# Patient Record
Sex: Female | Born: 1950
Health system: Southern US, Community
[De-identification: ages and names within clinical notes are randomized; demographics above are authoritative.]

## PROBLEM LIST (undated history)

## (undated) DIAGNOSIS — T7840XA Allergy, unspecified, initial encounter: Secondary | ICD-10-CM

## (undated) DIAGNOSIS — E785 Hyperlipidemia, unspecified: Secondary | ICD-10-CM

## (undated) DIAGNOSIS — C3490 Malignant neoplasm of unspecified part of unspecified bronchus or lung: Secondary | ICD-10-CM

## (undated) DIAGNOSIS — I10 Essential (primary) hypertension: Secondary | ICD-10-CM

## (undated) DIAGNOSIS — K635 Polyp of colon: Secondary | ICD-10-CM

## (undated) HISTORY — DX: Essential (primary) hypertension: I10

## (undated) HISTORY — DX: Allergy, unspecified, initial encounter: T78.40XA

## (undated) HISTORY — DX: Polyp of colon: K63.5

## (undated) HISTORY — PX: COLONOSCOPY: SHX174

## (undated) HISTORY — DX: Hyperlipidemia, unspecified: E78.5

---

## 1982-10-08 HISTORY — PX: ABDOMINAL HYSTERECTOMY: SHX81

## 1988-10-08 HISTORY — PX: BREAST BIOPSY: SHX20

## 2004-09-01 ENCOUNTER — Emergency Department: Payer: Self-pay | Admitting: Unknown Physician Specialty

## 2006-03-19 ENCOUNTER — Ambulatory Visit: Payer: Self-pay

## 2006-10-08 HISTORY — PX: CHOLECYSTECTOMY: SHX55

## 2007-03-25 ENCOUNTER — Ambulatory Visit: Payer: Self-pay | Admitting: Family Medicine

## 2008-06-01 ENCOUNTER — Ambulatory Visit: Payer: Self-pay

## 2008-06-16 ENCOUNTER — Ambulatory Visit: Payer: Self-pay

## 2010-02-16 ENCOUNTER — Ambulatory Visit: Payer: Self-pay | Admitting: Internal Medicine

## 2012-04-24 ENCOUNTER — Ambulatory Visit: Payer: Self-pay | Admitting: Internal Medicine

## 2013-01-22 ENCOUNTER — Ambulatory Visit: Payer: Self-pay

## 2013-01-26 ENCOUNTER — Ambulatory Visit: Payer: Self-pay

## 2013-01-26 LAB — CBC WITH DIFFERENTIAL/PLATELET
Eosinophil %: 2 %
HGB: 13.6 g/dL (ref 12.0–16.0)
Lymphocyte #: 2.2 10*3/uL (ref 1.0–3.6)
MCHC: 32.2 g/dL (ref 32.0–36.0)
MCV: 83 fL (ref 80–100)
Monocyte #: 0.6 x10 3/mm (ref 0.2–0.9)
Monocyte %: 9.1 %
Neutrophil #: 3.2 10*3/uL (ref 1.4–6.5)
Neutrophil %: 52 %
Platelet: 289 10*3/uL (ref 150–440)
WBC: 6.2 10*3/uL (ref 3.6–11.0)

## 2013-01-26 LAB — BASIC METABOLIC PANEL
Anion Gap: 9 (ref 7–16)
Calcium, Total: 9.5 mg/dL (ref 8.5–10.1)
Chloride: 98 mmol/L (ref 98–107)
Co2: 32 mmol/L (ref 21–32)
EGFR (Non-African Amer.): >60

## 2013-01-26 LAB — MAGNESIUM: Magnesium: 2.1 mg/dL

## 2013-04-29 ENCOUNTER — Ambulatory Visit: Payer: Self-pay | Admitting: Internal Medicine

## 2013-05-07 ENCOUNTER — Ambulatory Visit: Payer: Self-pay | Admitting: Internal Medicine

## 2013-05-08 HISTORY — PX: BREAST BIOPSY: SHX20

## 2013-05-25 ENCOUNTER — Ambulatory Visit (INDEPENDENT_AMBULATORY_CARE_PROVIDER_SITE_OTHER): Payer: BC Managed Care – PPO | Admitting: General Surgery

## 2013-05-25 ENCOUNTER — Other Ambulatory Visit: Payer: Self-pay

## 2013-05-25 ENCOUNTER — Encounter: Payer: Self-pay | Admitting: General Surgery

## 2013-05-25 VITALS — BP 132/84 | HR 80 | Resp 14 | Ht 64.0 in | Wt 144.0 lb

## 2013-05-25 DIAGNOSIS — N63 Unspecified lump in unspecified breast: Secondary | ICD-10-CM

## 2013-05-25 NOTE — Patient Instructions (Addendum)

## 2013-05-25 NOTE — Progress Notes (Signed)
Patient ID: Sandra Jordan, female   DOB: 1951/08/21, 62 y.o.   MRN: [id number]  Chief Complaint  Patient presents with  . Other    mammogram    HPI Sandra Jordan is a 62 y.o. female who presents for a breast evaluation. The most recent mammogram and ultrasound was done on 04/29/13 cat 4 Patient does perform regular self breast checks and gets regular mammograms done. Patient has had bilateral breast biopsy in the past.  No current symptoms.  HPI  Past Medical History  Diagnosis Date  . Hypertension   . Hyperlipidemia   . Allergy     Past Surgical History  Procedure Laterality Date  . Abdominal hysterectomy  1984  . Cholecystectomy  2008  . Breast biopsy Bilateral 1990    History reviewed. No pertinent family history.  Social History History  Substance Use Topics  . Smoking status: Never Smoker   . Smokeless tobacco: Never Used  . Alcohol Use: No    No Known Allergies  Current Outpatient Prescriptions  Medication Sig Dispense Refill  . cetirizine (ZYRTEC) 10 MG tablet Take 10 mg by mouth daily.      . hydrochlorothiazide (HYDRODIURIL) 25 MG tablet Take 25 mg by mouth daily.      Marland Kitchen lovastatin (MEVACOR) 20 MG tablet Take 20 mg by mouth at bedtime.       No current facility-administered medications for this visit.    Review of Systems Review of Systems  Constitutional: Negative.   Respiratory: Negative.   Cardiovascular: Negative.     Blood pressure 132/84, pulse 80, resp. rate 14, height 5\' 4"  (1.626 m), weight 144 lb (65.318 kg).  Physical Exam Physical Exam  Constitutional: She is oriented to person, place, and time. She appears well-developed and well-nourished.  Eyes: Conjunctivae are normal. No scleral icterus.  Neck: Neck supple.  Cardiovascular: Normal rate and normal heart sounds.   Pulmonary/Chest: Breath sounds normal. Right breast exhibits no inverted nipple, no mass, no nipple discharge, no skin change and no tenderness. Left breast exhibits no  inverted nipple, no mass, no nipple discharge, no skin change and no tenderness.  Abdominal: Bowel sounds are normal. There is no hepatosplenomegaly. There is no tenderness. No hernia.  Lymphadenopathy:    She has no cervical adenopathy.    She has no axillary adenopathy.  Neurological: She is alert and oriented to person, place, and time.  Skin: Skin is warm and dry.    Data Reviewed Mammogram and ultrasound reviewed.   Assessment    2 small solid nodules in right breast near areola at 1 and 5 o'cl    Plan    Discussed core biopsy anpt was agreeable -completed today.       SANKAR,SEEPLAPUTHUR G 05/25/2013, 9:43 AM

## 2013-05-26 LAB — PATHOLOGY

## 2013-05-27 ENCOUNTER — Telehealth: Payer: Self-pay | Admitting: *Deleted

## 2013-05-27 NOTE — Telephone Encounter (Signed)
Message copied by Currie Paris on Wed May 27, 2013  8:05 AM ------      Message from: Kieth Brightly      Created: Wed May 27, 2013  5:58 AM       Please let pt pt know the pathology was normal. F/u in 3 mos ------

## 2013-05-27 NOTE — Telephone Encounter (Signed)
Notified patient as instructed, patient very pleased. Discussed follow-up appointments, patient agrees 

## 2013-08-27 ENCOUNTER — Other Ambulatory Visit: Payer: BC Managed Care – PPO

## 2013-08-27 ENCOUNTER — Encounter: Payer: Self-pay | Admitting: General Surgery

## 2013-08-27 ENCOUNTER — Ambulatory Visit (INDEPENDENT_AMBULATORY_CARE_PROVIDER_SITE_OTHER): Payer: BC Managed Care – PPO | Admitting: General Surgery

## 2013-08-27 VITALS — BP 120/78 | HR 74 | Resp 12 | Ht 64.0 in | Wt 145.0 lb

## 2013-08-27 DIAGNOSIS — N63 Unspecified lump in unspecified breast: Secondary | ICD-10-CM

## 2013-08-27 NOTE — Patient Instructions (Addendum)
Continue self breast exams. Call office for any new breast issues or concerns. Return in 2 months with right mammogram and office visit

## 2013-08-27 NOTE — Progress Notes (Addendum)
Patient ID: Sandra Jordan, female   DOB: [birth year], 62 y.o.   MRN: [id number]  Chief Complaint  Patient presents with  . Follow-up    breast ultrasound    HPI Sandra Jordan is a 62 y.o. female.  who presents for her follow up right breast evaluation and ultrasound.  Patient does perform regular self breast checks and gets regular mammograms done.  No new breast complaints. 3 months ago she had a core biopsy of 2 nodules in the right breast, both were benign on pathology.  HPI  Past Medical History  Diagnosis Date  . Hypertension   . Hyperlipidemia   . Allergy     Past Surgical History  Procedure Laterality Date  . Abdominal hysterectomy  1984  . Cholecystectomy  2008  . Breast biopsy Bilateral 1990  . Breast biopsy Right August 2014    History reviewed. No pertinent family history.  Social History History  Substance Use Topics  . Smoking status: Never Smoker   . Smokeless tobacco: Never Used  . Alcohol Use: No    No Known Allergies  Current Outpatient Prescriptions  Medication Sig Dispense Refill  . cetirizine (ZYRTEC) 10 MG tablet Take 10 mg by mouth daily.      . hydrochlorothiazide (HYDRODIURIL) 25 MG tablet Take 25 mg by mouth daily.      Marland Kitchen lovastatin (MEVACOR) 20 MG tablet Take 20 mg by mouth at bedtime.       No current facility-administered medications for this visit.    Review of Systems Review of Systems  Constitutional: Negative.   Respiratory: Negative.   Cardiovascular: Negative.     Blood pressure 120/78, pulse 74, resp. rate 12, height 5\' 4"  (1.626 m), weight 145 lb (65.772 kg).  Physical Exam Physical Exam  Constitutional: She is oriented to person, place, and time. She appears well-developed and well-nourished.  Neck: Neck supple.  Pulmonary/Chest: Right breast exhibits no inverted nipple, no mass, no nipple discharge, no skin change and no tenderness. Left breast exhibits no inverted nipple, no mass, no nipple discharge, no skin  change and no tenderness.  Lymphadenopathy:    She has no cervical adenopathy.    She has no axillary adenopathy.  Neurological: She is alert and oriented to person, place, and time.  Skin: Skin is warm and dry.    Data Reviewed [place] right breast done today- mass at 1 o'cl is slightly larger with a thick margin-likely [post biopsy changes. Mass at 5 ocl remains unchanged to smaller.   Assessment    Benign nodules  Right breast    Plan    Return in 2 months with right mammogram and office visit       Sandra Jordan 08/28/2013, 5:53 PM

## 2013-08-28 ENCOUNTER — Encounter: Payer: Self-pay | Admitting: General Surgery

## 2013-08-28 ENCOUNTER — Telehealth: Payer: Self-pay | Admitting: *Deleted

## 2013-08-28 ENCOUNTER — Encounter: Payer: Self-pay | Admitting: *Deleted

## 2013-08-28 NOTE — Telephone Encounter (Signed)
The patient has been asked to return to the office in two months for a unilateral right breast diagnostic mammogram. This has been arranged at [facility] for 10-22-13 at 4 pm. She is presently scheduled to follow up in the office on 11-03-13 at 4:45 pm. Message has been left on home phone number as patient had requested. Letter has also been mailed to patient with this information.

## 2013-10-22 ENCOUNTER — Ambulatory Visit: Payer: Self-pay | Admitting: General Surgery

## 2013-10-23 ENCOUNTER — Encounter: Payer: Self-pay | Admitting: General Surgery

## 2013-10-26 ENCOUNTER — Encounter: Payer: Self-pay | Admitting: General Surgery

## 2013-11-03 ENCOUNTER — Encounter: Payer: Self-pay | Admitting: General Surgery

## 2013-11-03 ENCOUNTER — Ambulatory Visit (INDEPENDENT_AMBULATORY_CARE_PROVIDER_SITE_OTHER): Payer: BC Managed Care – PPO | Admitting: General Surgery

## 2013-11-03 VITALS — BP 140/78 | HR 78 | Resp 12 | Ht 64.0 in | Wt 142.0 lb

## 2013-11-03 DIAGNOSIS — N63 Unspecified lump in unspecified breast: Secondary | ICD-10-CM

## 2013-11-03 NOTE — Patient Instructions (Signed)
Patient to return in 6 months with bilateral screening mammogram. Patient to continue self breast checks monthly. Patient to call our office with any questions or concerns.

## 2013-11-03 NOTE — Progress Notes (Signed)
Patient ID: Sandra Jordan, female   DOB: December 14, 1950, 63 y.o.   MRN: [id number]  Chief Complaint  Patient presents with  . Follow-up    mammogram    HPI Sandra Jordan is a 63 y.o. female who presents for a breast evaluation. The most recent mammogram was done on 10/26/13. Patient does perform regular self breast checks and gets regular mammograms done.  The patient denies any problems with her breasts at this time. 5 months ago patient had right breast core biopsy of 2 masses at 1 and 5 o'clock with benign pathology.   HPI  Past Medical History  Diagnosis Date  . Hypertension   . Hyperlipidemia   . Allergy     Past Surgical History  Procedure Laterality Date  . Abdominal hysterectomy  1984  . Cholecystectomy  2008  . Breast biopsy Bilateral 1990  . Breast biopsy Right August 2014    History reviewed. No pertinent family history.  Social History History  Substance Use Topics  . Smoking status: Never Smoker   . Smokeless tobacco: Never Used  . Alcohol Use: No    No Known Allergies  Current Outpatient Prescriptions  Medication Sig Dispense Refill  . cetirizine (ZYRTEC) 10 MG tablet Take 10 mg by mouth daily.      . hydrochlorothiazide (HYDRODIURIL) 25 MG tablet Take 25 mg by mouth daily.      Marland Kitchen lovastatin (MEVACOR) 20 MG tablet Take 20 mg by mouth at bedtime.       No current facility-administered medications for this visit.    Review of Systems Review of Systems  Constitutional: Negative.   Respiratory: Negative.   Cardiovascular: Negative.     Blood pressure 140/78, pulse 78, resp. rate 12, height 5\' 4"  (1.626 m), weight 142 lb (64.411 kg).  Physical Exam Physical Exam  Constitutional: She appears well-developed and well-nourished.  Neck: Neck supple. No thyromegaly present.  Cardiovascular: Regular rhythm.   Pulmonary/Chest: Right breast exhibits no inverted nipple, no mass, no nipple discharge, no skin change and no tenderness. Left breast exhibits no  inverted nipple, no mass, no nipple discharge, no skin change and no tenderness.  Lymphadenopathy:    She has no cervical adenopathy.    She has no axillary adenopathy.    Data Reviewed Mammogram and [place] of right breast   reviewed. Mass at 1 ocl is stable. Mas at 5 ocl is not seen.  Assessment    Breast masses right -benign     Plan    6 mo f/u with bil screening mammogram.        Theopolis Sloop G 11/04/2013, 5:55 AM

## 2014-01-18 ENCOUNTER — Ambulatory Visit: Payer: Self-pay | Admitting: Podiatry

## 2014-01-18 LAB — CBC WITH DIFFERENTIAL/PLATELET
BASOS PCT: 0.9 %
Basophil #: 0.1 10*3/uL (ref 0.0–0.1)
Eosinophil #: 0.2 10*3/uL (ref 0.0–0.7)
Eosinophil %: 2.6 %
HCT: 38.6 % (ref 35.0–47.0)
HGB: 12.4 g/dL (ref 12.0–16.0)
LYMPHS ABS: 2.2 10*3/uL (ref 1.0–3.6)
LYMPHS PCT: 37.9 %
MCH: 27.1 pg (ref 26.0–34.0)
MCHC: 32.1 g/dL (ref 32.0–36.0)
MCV: 84 fL (ref 80–100)
MONOS PCT: 10 %
Monocyte #: 0.6 x10 3/mm (ref 0.2–0.9)
Neutrophil #: 2.9 10*3/uL (ref 1.4–6.5)
Neutrophil %: 48.6 %
Platelet: 287 10*3/uL (ref 150–440)
RBC: 4.58 10*6/uL (ref 3.80–5.20)
RDW: 13.6 % (ref 11.5–14.5)
WBC: 5.9 10*3/uL (ref 3.6–11.0)

## 2014-01-22 ENCOUNTER — Ambulatory Visit: Payer: Self-pay | Admitting: Podiatry

## 2014-01-27 LAB — PATHOLOGY REPORT

## 2014-04-22 ENCOUNTER — Encounter: Payer: Self-pay | Admitting: General Surgery

## 2014-05-11 ENCOUNTER — Encounter: Payer: Self-pay | Admitting: General Surgery

## 2014-05-11 ENCOUNTER — Ambulatory Visit (INDEPENDENT_AMBULATORY_CARE_PROVIDER_SITE_OTHER): Payer: BC Managed Care – PPO | Admitting: General Surgery

## 2014-05-11 VITALS — BP 140/72 | HR 76 | Resp 12 | Ht 64.0 in | Wt 146.0 lb

## 2014-05-11 DIAGNOSIS — N6012 Diffuse cystic mastopathy of left breast: Principal | ICD-10-CM

## 2014-05-11 DIAGNOSIS — N6019 Diffuse cystic mastopathy of unspecified breast: Secondary | ICD-10-CM

## 2014-05-11 DIAGNOSIS — N6011 Diffuse cystic mastopathy of right breast: Secondary | ICD-10-CM | POA: Insufficient documentation

## 2014-05-11 NOTE — Progress Notes (Signed)
Patient ID: Sandra Jordan, female   DOB: 10/27/1950, 63 y.o.   MRN: [id number]  Chief Complaint  Patient presents with  . Follow-up    mammogram    HPI Sandra Jordan is a 63 y.o. female.  who presents for a breast evaluation. The most recent mammogram was done on 04-21-14.  Patient does perform regular self breast checks and gets regular mammograms done.  No new breast issues.  HPI  Past Medical History  Diagnosis Date  . Hypertension   . Hyperlipidemia   . Allergy     Past Surgical History  Procedure Laterality Date  . Abdominal hysterectomy  1984  . Cholecystectomy  2008  . Breast biopsy Bilateral 1990    fibrocystic breast  . Breast biopsy Right August 2014    fibrocystic breast    Family History  Problem Relation Age of Onset  . Cancer Mother     lung  . Cancer Sister     lung    Social History History  Substance Use Topics  . Smoking status: Never Smoker   . Smokeless tobacco: Never Used  . Alcohol Use: No    No Known Allergies  Current Outpatient Prescriptions  Medication Sig Dispense Refill  . acetaminophen (TYLENOL) 500 MG tablet Take by mouth every 4 (four) hours as needed.       . cetirizine (ZYRTEC) 10 MG tablet Take 10 mg by mouth daily.      . hydrochlorothiazide (HYDRODIURIL) 25 MG tablet Take 25 mg by mouth daily.      Marland Kitchen lovastatin (MEVACOR) 20 MG tablet Take 20 mg by mouth at bedtime.       No current facility-administered medications for this visit.    Review of Systems Review of Systems  Constitutional: Negative.   Respiratory: Negative.   Cardiovascular: Negative.     Blood pressure 140/72, pulse 76, resp. rate 12, height 5\' 4"  (1.626 m), weight 146 lb (66.225 kg).  Physical Exam Physical Exam  Constitutional: She is oriented to person, place, and time. She appears well-developed and well-nourished.  Eyes: No scleral icterus.  Neck: Neck supple.  Cardiovascular: Normal rate, regular rhythm and normal heart sounds.    Pulmonary/Chest: Effort normal and breath sounds normal. Right breast exhibits no inverted nipple, no mass, no nipple discharge, no skin change and no tenderness. Left breast exhibits no inverted nipple, no mass, no nipple discharge, no skin change and no tenderness.  Lymphadenopathy:    She has no cervical adenopathy.    She has no axillary adenopathy.  Neurological: She is alert and oriented to person, place, and time.  Skin: Skin is warm and dry.    Data Reviewed Mammogram reviewed and stable.  Assessment    Stable physical exam. Fibrocystic disease by core biopsies done last year and in [place]    Follow up in one year with bilateral screening mammogram and office visit.       SANKAR,SEEPLAPUTHUR G 05/11/2014, 4:51 PM

## 2014-05-11 NOTE — Patient Instructions (Addendum)
Continue self breast exams. Call office for any new breast issues or concerns. Follow up in one year with bilateral screening mammogram and office visit 

## 2014-08-09 ENCOUNTER — Encounter: Payer: Self-pay | Admitting: General Surgery

## 2015-01-29 NOTE — Op Note (Signed)
PATIENT NAME:  Sandra Jordan, Sandra Jordan MR#:  [id number] DATE OF BIRTH:  Apr 23, 1951  DATE OF PROCEDURE:  01/22/2014  SURGEON: Durward Fortes, DPM.  PREOPERATIVE DIAGNOSIS: Exostosis, right midfoot.   POSTOPERATIVE DIAGNOSIS:  Exostosis, right midfoot.  PROCEDURE PERFORMED:  Excision exostosis, right midfoot.  ANESTHESIA: Local MAC.   HEMOSTASIS: Pneumatic tourniquet, right ankle, 250 mmHg.   ESTIMATED BLOOD LOSS: Minimal.   PATHOLOGY: Bone, right midfoot.   COMPLICATIONS: None apparent.   OPERATIVE INDICATIONS: This is a 64 year old female with a chronic painful bone spur on her right midfoot. The patient elects for surgical removal.   OPERATIVE PROCEDURE: The patient was taken to the operating room and placed on the table in the supine position. Following satisfactory sedation, the right foot was anesthetized with 10 mL of 0.5% Sensorcaine plain around the palpable lesion. A pneumatic tourniquet was applied at the level of the right calf, and the foot was prepped and draped in the usual sterile fashion. The foot was exsanguinated and the tourniquet inflated to 250 mmHg.   Attention was then directed to the dorsal aspect of the right foot where an approximate 4 cm linear incision was made over the bony prominence at the first metatarsal cuneiform joint. The incision was deepened via sharp and blunt dissection down to the level of the spur, which was freed from the surrounding soft tissues. Using a curved osteotome, the bony prominence was resected and the edges were rongeured. The bone was then rasped using a power rasp. Intraoperative FluoroScan views revealed good reduction of the deformity. The wound was flushed with copious amounts of sterile saline and closed using 4-0 Vicryl running suture for all layers from capsular tissue to deep and superficial subcutaneous and skin closure. Tincture of benzoin and Steri-Strips were then applied, followed by Xeroform and a sterile bandage. The patient  tolerated the procedure and anesthesia well and was transported to the PACU with vital signs stable and in good condition.     ____________________________ Sharlotte Alamo, DPM tc:dmm D: 01/22/2014 12:02:58 ET T: 01/22/2014 12:24:08 ET JOB#: [id number]  cc: Sharlotte Alamo, DPM, <Dictator> Delmos Velaquez DPM ELECTRONICALLY SIGNED 01/26/2014 11:36

## 2015-04-06 ENCOUNTER — Other Ambulatory Visit: Payer: Self-pay

## 2015-04-06 DIAGNOSIS — Z1231 Encounter for screening mammogram for malignant neoplasm of breast: Secondary | ICD-10-CM

## 2015-05-25 ENCOUNTER — Ambulatory Visit: Payer: Self-pay | Admitting: General Surgery

## 2015-06-02 ENCOUNTER — Encounter: Payer: Self-pay | Admitting: General Surgery

## 2015-06-02 ENCOUNTER — Ambulatory Visit (INDEPENDENT_AMBULATORY_CARE_PROVIDER_SITE_OTHER): Payer: BLUE CROSS/BLUE SHIELD | Admitting: General Surgery

## 2015-06-02 DIAGNOSIS — N6011 Diffuse cystic mastopathy of right breast: Secondary | ICD-10-CM

## 2015-06-02 DIAGNOSIS — N6012 Diffuse cystic mastopathy of left breast: Secondary | ICD-10-CM | POA: Diagnosis not present

## 2015-06-02 NOTE — Patient Instructions (Signed)
Continue self breast exams. Call office for any new breast issues or concerns. 

## 2015-06-02 NOTE — Progress Notes (Signed)
Patient ID: Sandra Jordan, female   DOB: Aug 31, 1951, 65 y.o.   MRN: [id number]  Chief Complaint  Patient presents with  . Follow-up    mammogram    HPI Sandra Jordan is a 64 y.o. female.  who presents for a breast evaluation. The most recent mammogram was done on 05-19-15.  Patient does perform regular self breast checks and gets regular mammograms done. No new breast issues.  HPI  Past Medical History  Diagnosis Date  . Hypertension   . Hyperlipidemia   . Allergy     Past Surgical History  Procedure Laterality Date  . Abdominal hysterectomy  1984  . Cholecystectomy  2008  . Breast biopsy Bilateral 1990    fibrocystic breast  . Breast biopsy Right August 2014    fibrocystic breast  . Colonoscopy  2012    [facility]    Family History  Problem Relation Age of Onset  . Cancer Mother     lung  . Cancer Sister     lung    Social History Social History  Substance Use Topics  . Smoking status: Former Smoker -- 15 years    Quit date: 10/08/2004  . Smokeless tobacco: Never Used  . Alcohol Use: No    No Known Allergies  Current Outpatient Prescriptions  Medication Sig Dispense Refill  . acetaminophen (TYLENOL) 500 MG tablet Take by mouth every 4 (four) hours as needed.     . cetirizine (ZYRTEC) 10 MG tablet Take 10 mg by mouth daily.    . hydrochlorothiazide (HYDRODIURIL) 25 MG tablet Take 25 mg by mouth daily.    Marland Kitchen lovastatin (MEVACOR) 20 MG tablet Take 20 mg by mouth at bedtime.     No current facility-administered medications for this visit.    Review of Systems Review of Systems  Constitutional: Negative.   Respiratory: Negative.   Cardiovascular: Negative.     Blood pressure 110/66, pulse 82, resp. rate 12, height '5\' 4"'$  (1.626 m), weight 142 lb (64.411 kg).  Physical Exam Physical Exam  Constitutional: She is oriented to person, place, and time. She appears well-developed and well-nourished.  HENT:  Mouth/Throat: Oropharynx is clear and  moist.  Eyes: Conjunctivae are normal. No scleral icterus.  Neck: Neck supple.  Cardiovascular: Normal rate, regular rhythm and normal heart sounds.   Pulmonary/Chest: Effort normal and breath sounds normal. Right breast exhibits no inverted nipple, no mass, no nipple discharge, no skin change and no tenderness. Left breast exhibits no inverted nipple, no mass, no nipple discharge, no skin change and no tenderness.  Abdominal: Soft. There is no tenderness.  Lymphadenopathy:    She has no cervical adenopathy.    She has no axillary adenopathy.  Neurological: She is alert and oriented to person, place, and time.  Skin: Skin is warm and dry.  Psychiatric: Her behavior is normal.    Data Reviewed Mammogram reviewed and stable.  Assessment    Stable physical exam. Fibrocystic disease by core biopsies done 2014 and in [place]    Follow up in one year with bilateral screening mammogram and office visit.         PCP:  Kingsley Spittle P  Trenell Concannon G 06/02/2015, 11:30 AM

## 2016-05-17 DIAGNOSIS — K635 Polyp of colon: Secondary | ICD-10-CM | POA: Insufficient documentation

## 2016-05-17 DIAGNOSIS — J309 Allergic rhinitis, unspecified: Secondary | ICD-10-CM | POA: Insufficient documentation

## 2016-05-22 ENCOUNTER — Encounter: Payer: Self-pay | Admitting: General Surgery

## 2016-05-30 ENCOUNTER — Encounter: Payer: Self-pay | Admitting: *Deleted

## 2016-06-05 ENCOUNTER — Encounter: Payer: Self-pay | Admitting: General Surgery

## 2016-06-05 ENCOUNTER — Ambulatory Visit (INDEPENDENT_AMBULATORY_CARE_PROVIDER_SITE_OTHER): Payer: BLUE CROSS/BLUE SHIELD | Admitting: General Surgery

## 2016-06-05 VITALS — BP 122/78 | HR 68 | Resp 12 | Ht 64.0 in | Wt 145.0 lb

## 2016-06-05 DIAGNOSIS — N6012 Diffuse cystic mastopathy of left breast: Secondary | ICD-10-CM | POA: Diagnosis not present

## 2016-06-05 DIAGNOSIS — N6011 Diffuse cystic mastopathy of right breast: Secondary | ICD-10-CM

## 2016-06-05 NOTE — Patient Instructions (Signed)
The patient is aware to call back for any questions or concerns. Follow up in one year with bilateral screening mammogram and office visit

## 2016-06-05 NOTE — Progress Notes (Signed)
Patient ID: Sandra Jordan, female   DOB: [birth year], 65 y.o.   MRN: [id number]  Chief Complaint  Patient presents with  . Follow-up    mammogram    HPI Sandra Jordan is a 65 y.o. female.  who presents for a breast evaluation. The most recent mammogram was done on 05-21-16.  Patient does perform regular self breast checks and gets regular mammograms done.   No new breast issues. I have reviewed the history of present illness with the patient.  HPI  Past Medical History:  Diagnosis Date  . Allergy   . Colon polyp   . Hyperlipidemia   . Hypertension     Past Surgical History:  Procedure Laterality Date  . ABDOMINAL HYSTERECTOMY  1984  . BREAST BIOPSY Bilateral 1990   fibrocystic breast  . BREAST BIOPSY Right August 2014   fibrocystic breast  . CHOLECYSTECTOMY  2008  . COLONOSCOPY  2012, 2017   [facility]    Family History  Problem Relation Age of Onset  . Cancer Mother     lung  . Cancer Sister     lung  . Colon cancer Father 20    Social History Social History  Substance Use Topics  . Smoking status: Former Smoker    Years: 15.00    Quit date: 10/08/2004  . Smokeless tobacco: Never Used  . Alcohol use No    No Known Allergies  Current Outpatient Prescriptions  Medication Sig Dispense Refill  . acetaminophen (TYLENOL) 500 MG tablet Take by mouth every 4 (four) hours as needed.     . cetirizine (ZYRTEC) 10 MG tablet Take 10 mg by mouth daily.    . Cyanocobalamin (VITAMIN B 12 PO) Take by mouth daily.    . hydrochlorothiazide (HYDRODIURIL) 25 MG tablet Take 25 mg by mouth daily.    Marland Kitchen lovastatin (MEVACOR) 20 MG tablet Take 20 mg by mouth at bedtime.     No current facility-administered medications for this visit.     Review of Systems Review of Systems  Constitutional: Negative.   Respiratory: Negative.   Cardiovascular: Negative.     Blood pressure 122/78, pulse 68, resp. rate 12, height '5\' 4"'$  (1.626 m), weight 145 lb (65.8 kg).  Physical  Exam Physical Exam  Constitutional: She is oriented to person, place, and time. She appears well-developed and well-nourished.  HENT:  Mouth/Throat: Oropharynx is clear and moist.  Eyes: Conjunctivae are normal. No scleral icterus.  Neck: Neck supple.  Cardiovascular: Normal rate, regular rhythm and normal heart sounds.   Pulmonary/Chest: Effort normal and breath sounds normal. Right breast exhibits no inverted nipple, no mass, no nipple discharge, no skin change and no tenderness. Left breast exhibits no inverted nipple, no mass, no nipple discharge, no skin change and no tenderness.  Abdominal: Soft. There is no tenderness.  Lymphadenopathy:    She has no cervical adenopathy.    She has no axillary adenopathy.  Neurological: She is alert and oriented to person, place, and time.  Skin: Skin is warm and dry.  Psychiatric: Her behavior is normal.    Data Reviewed Mammogram reviewed and stable  Assessment    Stable physical exam. Fibrocystic disease by core biopsies done 2014 and in [place]    Follow up in one year with bilateral screening mammogram and office visit     This information has been scribed by Karie Fetch RN, BSN,BC.   Maahi Lannan G 06/05/2016, 5:34 PM

## 2017-02-18 ENCOUNTER — Other Ambulatory Visit: Payer: Self-pay | Admitting: Physician Assistant

## 2017-02-18 DIAGNOSIS — N200 Calculus of kidney: Secondary | ICD-10-CM

## 2017-02-19 ENCOUNTER — Ambulatory Visit
Admission: RE | Admit: 2017-02-19 | Discharge: 2017-02-19 | Disposition: A | Discharge status: Home / Self Care | Payer: BLUE CROSS/BLUE SHIELD | Source: Ambulatory Visit | Attending: Physician Assistant | Admitting: Physician Assistant

## 2017-02-19 ENCOUNTER — Other Ambulatory Visit: Payer: Self-pay

## 2017-02-19 DIAGNOSIS — N200 Calculus of kidney: Secondary | ICD-10-CM | POA: Diagnosis not present

## 2017-02-19 DIAGNOSIS — K5790 Diverticulosis of intestine, part unspecified, without perforation or abscess without bleeding: Secondary | ICD-10-CM | POA: Diagnosis not present

## 2017-03-08 ENCOUNTER — Ambulatory Visit (INDEPENDENT_AMBULATORY_CARE_PROVIDER_SITE_OTHER): Payer: BLUE CROSS/BLUE SHIELD | Admitting: Urology

## 2017-03-08 ENCOUNTER — Encounter: Payer: Self-pay | Admitting: Urology

## 2017-03-08 VITALS — BP 133/88 | HR 99 | Ht 63.0 in | Wt 143.0 lb

## 2017-03-08 DIAGNOSIS — M545 Low back pain, unspecified: Secondary | ICD-10-CM

## 2017-03-08 DIAGNOSIS — N200 Calculus of kidney: Secondary | ICD-10-CM | POA: Diagnosis not present

## 2017-03-08 DIAGNOSIS — I10 Essential (primary) hypertension: Secondary | ICD-10-CM | POA: Insufficient documentation

## 2017-03-08 DIAGNOSIS — E785 Hyperlipidemia, unspecified: Secondary | ICD-10-CM | POA: Insufficient documentation

## 2017-03-08 LAB — URINALYSIS, COMPLETE
Bilirubin, UA: NEGATIVE
Glucose, UA: NEGATIVE
Ketones, UA: NEGATIVE
Nitrite, UA: NEGATIVE
Protein, UA: NEGATIVE
RBC, UA: NEGATIVE
Specific Gravity, UA: 1.025 (ref 1.005–1.030)
Urobilinogen, Ur: 0.2 mg/dL (ref 0.2–1.0)
pH, UA: 6 (ref 5.0–7.5)

## 2017-03-08 LAB — MICROSCOPIC EXAMINATION
Epithelial Cells (non renal): >10 /HPF — ABNORMAL HIGH
RBC, UA: NONE SEEN /HPF

## 2017-03-08 NOTE — Progress Notes (Signed)
03/08/2017 2:38 PM   Sandra Jordan 1950/11/24 [id number]  Referring provider: Marinda Elk, MD [facility], [place] [address]  Chief Complaint  Patient presents with  . Nephrolithiasis    New Patient    HPI: 66 year old female who presents today for further evaluation of an incidental left 6 mm lower pole stone.  She reports that a few weeks ago, she developed diffuse low back pain which started more in the left side that radiated across her whole back. She has had difficulty getting out of bed in the morning his pain is been quite severe at times. It improves throughout the day.  Overall, her pain has been improving with avoidance of heavy lifting.  She denies any voiding symptoms including urinary frequency, urgency, incontinence, dysuria, or gross hematuria.  She has no previous stone history.   PMH: Past Medical History:  Diagnosis Date  . Allergy   . Colon polyp   . Hyperlipidemia   . Hypertension     Surgical History: Past Surgical History:  Procedure Laterality Date  . ABDOMINAL HYSTERECTOMY  1984  . BREAST BIOPSY Bilateral 1990   fibrocystic breast  . BREAST BIOPSY Right August 2014   fibrocystic breast  . CHOLECYSTECTOMY  2008  . COLONOSCOPY  2012, 2017   [facility] Medications:  Allergies as of 03/08/2017   No Known Allergies     Medication List       Accurate as of 03/08/17  2:38 PM. Always use your most recent med list.          cetirizine 10 MG tablet Commonly known as:  ZYRTEC Take 10 mg by mouth daily.   hydrochlorothiazide 25 MG tablet Commonly known as:  HYDRODIURIL Take 25 mg by mouth daily.   lovastatin 20 MG tablet Commonly known as:  MEVACOR Take 20 mg by mouth at bedtime.       Allergies: No Known Allergies  Family History: Family History  Problem Relation Age of Onset  . Cancer Mother        lung  . Cancer Sister        lung  . Colon cancer Father 8  .  Prostate cancer Father     Social History:  reports that she quit smoking about 12 years ago. She quit after 15.00 years of use. She has never used smokeless tobacco. She reports that she does not drink alcohol or use drugs.  ROS: UROLOGY Frequent Urination?: No Hard to postpone urination?: No Burning/pain with urination?: No Get up at night to urinate?: Yes Leakage of urine?: No Urine stream starts and stops?: No Trouble starting stream?: No Do you have to strain to urinate?: No Blood in urine?: No Urinary tract infection?: No Sexually transmitted disease?: No Injury to kidneys or bladder?: No Painful intercourse?: No Weak stream?: No Currently pregnant?: No Vaginal bleeding?: No Last menstrual period?: n  Gastrointestinal Nausea?: No Vomiting?: No Indigestion/heartburn?: No Diarrhea?: No Constipation?: No  Constitutional Fever: No Night sweats?: No Weight loss?: No Fatigue?: No  Skin Skin rash/lesions?: No Itching?: No  Eyes Blurred vision?: No Double vision?: No  Ears/Nose/Throat Sore throat?: No Sinus problems?: No  Hematologic/Lymphatic Swollen glands?: No Easy bruising?: No  Cardiovascular Leg swelling?: No Chest pain?: No  Respiratory Cough?: No Shortness of breath?: No  Endocrine Excessive thirst?: No  Musculoskeletal Back pain?: Yes Joint pain?: No  Neurological Headaches?: Yes Dizziness?: No  Psychologic Depression?: No Anxiety?: No  Physical Exam: BP 133/88   Pulse 99   Ht 5\' 3"  (1.6 m)   Wt 143 lb (64.9 kg)   BMI 25.33 kg/m   Constitutional:  Alert and oriented, No acute distress.  Appears younger than stated age. HEENT: [place] AT, moist mucus membranes.  Trachea midline, no masses. Cardiovascular: No clubbing, cyanosis, or edema. Respiratory: Normal respiratory effort, no increased work of breathing. GI: Abdomen is soft, nontender, nondistended, no abdominal masses GU: No CVA tenderness.  Skin: No rashes, bruises or  suspicious lesions. Neurologic: Grossly intact, no focal deficits, moving all 4 extremities. Psychiatric: Normal mood and affect.  Laboratory Data: Lab Results  Component Value Date   WBC 5.9 01/18/2014   HGB 12.4 01/18/2014   HCT 38.6 01/18/2014   MCV 84 01/18/2014   PLT 287 01/18/2014    Lab Results  Component Value Date   CREATININE 0.75 01/26/2013    Urinalysis Results for orders placed or performed in visit on 03/08/17  Microscopic Examination  Result Value Ref Range   WBC, UA 0-5 0 - 5 /hpf   RBC, UA None seen 0 - 2 /hpf   Epithelial Cells (non renal) >10 (H) 0 - 10 /hpf   Bacteria, UA Many (A) None seen/Few  Urinalysis, Complete  Result Value Ref Range   Specific Gravity, UA 1.025 1.005 - 1.030   pH, UA 6.0 5.0 - 7.5   Color, UA Yellow Yellow   Appearance Ur Cloudy (A) Clear   Leukocytes, UA Trace (A) Negative   Protein, UA Negative Negative/Trace   Glucose, UA Negative Negative   Ketones, UA Negative Negative   RBC, UA Negative Negative   Bilirubin, UA Negative Negative   Urobilinogen, Ur 0.2 0.2 - 1.0 mg/dL   Nitrite, UA Negative Negative   Microscopic Examination See below:     Pertinent Imaging: CLINICAL DATA:  Left flank pain over the last week.  EXAM: CT ABDOMEN AND PELVIS WITHOUT CONTRAST  TECHNIQUE: Multidetector CT imaging of the abdomen and pelvis was performed following the standard protocol without IV contrast.  COMPARISON:  None.  FINDINGS: Lower chest: Negative  Hepatobiliary: Liver parenchyma appears normal without contrast. Previous cholecystectomy.  Pancreas: Normal  Spleen: Normal  Adrenals/Urinary Tract: Adrenal glands are normal. Right kidney is normal. Left kidney contains a 6 mm stone in the lower pole. No hydronephrosis. No evidence of passing stone. Bladder is normal.  Stomach/Bowel: Diverticulosis without evidence of diverticulitis. No other bowel finding.  Vascular/Lymphatic: Aortic atherosclerosis. No  aneurysm. IVC normal. No retroperitoneal mass or adenopathy.  Reproductive: Previous hysterectomy.  No pelvic mass.  Other: No free fluid or air.  Musculoskeletal: Ordinary mild lumbar degenerative changes. Ordinary mild hip osteoarthritis.  IMPRESSION: 6 mm nonobstructing stone in the lower pole of left kidney. No other urinary tract stone disease. No hydronephrosis.  No distinct cause of left flank pain identified. Mild diverticulosis but no evidence of diverticulitis.   Electronically Signed   By: Nelson Chimes M.D.   On: 02/19/2017 10:02  CT scan imaging was reviewed personally today and with the patient.  Assessment & Plan:    1. Nephrolithiasis Incidental asymptomatic round 6 mm nonobstructing left lower pole stone, relatively dense  Discussed options for management of the stone including observation, ESWL, and ureteroscopy. We discussed that the stones are typically asymptomatic and as such, at this point in time would recommend continued surveillance, KUB in 6 months to assess for growth of metabolic stone disease. If there is no much interval growth,  we'll continue monitor clinically.  Voiding symptoms are reviewed.  We discussed general stone prevention techniques including drinking plenty water with goal of producing 2.5 L urine daily, increased citric acid intake, avoidance of high oxalate containing foods, and decreased salt intake.  Information about dietary recommendations given today.   - Urinalysis, Complete - DG Abd 1 View; Future  2. Acute midline low back pain without sciatica Petra Kuba and onset of symptoms more consistent with lower back muscle strain, improving   Return in about 6 months (around 09/07/2017) for KUB.  Hollice Espy, MD  [facility] [address], [place], [place] [address] [phone]

## 2017-05-29 ENCOUNTER — Ambulatory Visit: Payer: BLUE CROSS/BLUE SHIELD | Admitting: General Surgery

## 2017-06-26 DIAGNOSIS — Z1231 Encounter for screening mammogram for malignant neoplasm of breast: Secondary | ICD-10-CM | POA: Diagnosis not present

## 2017-07-01 ENCOUNTER — Encounter: Payer: Self-pay | Admitting: General Surgery

## 2017-07-03 ENCOUNTER — Ambulatory Visit (INDEPENDENT_AMBULATORY_CARE_PROVIDER_SITE_OTHER): Payer: PPO | Admitting: General Surgery

## 2017-07-03 ENCOUNTER — Encounter: Payer: Self-pay | Admitting: General Surgery

## 2017-07-03 VITALS — BP 128/74 | HR 82 | Resp 12 | Ht 64.0 in | Wt 141.0 lb

## 2017-07-03 DIAGNOSIS — N6012 Diffuse cystic mastopathy of left breast: Secondary | ICD-10-CM | POA: Diagnosis not present

## 2017-07-03 DIAGNOSIS — N6011 Diffuse cystic mastopathy of right breast: Secondary | ICD-10-CM | POA: Diagnosis not present

## 2017-07-03 NOTE — Patient Instructions (Signed)
The patient is aware to call back for any questions or new concerns.  

## 2017-07-03 NOTE — Progress Notes (Signed)
Patient ID: Sandra Jordan, female   DOB: [birth year], 66 y.o.   MRN: [id number]  Chief Complaint  Patient presents with  . Follow-up    HPI Sandra Jordan is a 66 y.o. female.  who presents for a breast evaluation. The most recent mammogram was done on 06-26-17.  Patient does perform regular self breast checks and gets regular mammograms done.   No new breast issues.  HPI  Past Medical History:  Diagnosis Date  . Allergy   . Colon polyp   . Hyperlipidemia   . Hypertension     Past Surgical History:  Procedure Laterality Date  . ABDOMINAL HYSTERECTOMY  1984  . BREAST BIOPSY Bilateral 1990   fibrocystic breast  . BREAST BIOPSY Right August 2014   fibrocystic breast  . CHOLECYSTECTOMY  2008  . COLONOSCOPY  2012, 2017   [facility]    Family History  Problem Relation Age of Onset  . Cancer Mother        lung  . Cancer Sister        lung  . Colon cancer Father 34  . Prostate cancer Father     Social History Social History  Substance Use Topics  . Smoking status: Former Smoker    Years: 15.00    Quit date: 10/08/2004  . Smokeless tobacco: Never Used  . Alcohol use No    No Known Allergies  Current Outpatient Prescriptions  Medication Sig Dispense Refill  . cetirizine (ZYRTEC) 10 MG tablet Take 10 mg by mouth daily.    . hydrochlorothiazide (HYDRODIURIL) 25 MG tablet Take 25 mg by mouth daily.    Marland Kitchen lovastatin (MEVACOR) 20 MG tablet Take 20 mg by mouth at bedtime.     No current facility-administered medications for this visit.     Review of Systems Review of Systems  Constitutional: Negative.   Respiratory: Negative.   Cardiovascular: Negative.     Blood pressure 128/74, pulse 82, resp. rate 12, height 5\' 4"  (1.626 m), weight 141 lb (64 kg).  Physical Exam Physical Exam  Constitutional: She is oriented to person, place, and time. She appears well-developed and well-nourished.  HENT:  Mouth/Throat: Oropharynx is clear and moist.  Eyes:  Conjunctivae are normal. No scleral icterus.  Neck: Neck supple.  Cardiovascular: Normal rate, regular rhythm and normal heart sounds.   Pulmonary/Chest: Effort normal and breath sounds normal. No respiratory distress. Right breast exhibits no inverted nipple, no mass, no nipple discharge, no skin change and no tenderness. Left breast exhibits no inverted nipple, no mass, no nipple discharge, no skin change and no tenderness.  Lymphadenopathy:    She has no cervical adenopathy.    She has no axillary adenopathy.  Neurological: She is alert and oriented to person, place, and time.  Skin: Skin is warm and dry.  Psychiatric: Her behavior is normal.    Data Reviewed Mammogram reviewed and stable.  Assessment    Fibrocystic disease with core biopsies done 2014 and in 1990  - Stable physical exam and mammogram. Discussed recent mammogram results with patient.     Plan    Follow up in one year with bilateral screening mammogram and office visit with her PCP, Dr. Carrie Mew.      HPI, Physical Exam, Assessment and Plan have been scribed under the direction and in the presence of Mckinley Jewel, MD Karie Fetch, RN  I have completed the exam and reviewed the above documentation for accuracy and completeness.  I agree with  the above.  Haematologist has been used and any errors in dictation or transcription are unintentional.  Renel Ende G. Jamal Collin, M.D., F.A.C.S.   Junie Panning G 07/03/2017, 9:58 AM

## 2017-07-10 DIAGNOSIS — Z23 Encounter for immunization: Secondary | ICD-10-CM | POA: Diagnosis not present

## 2017-08-21 DIAGNOSIS — H6122 Impacted cerumen, left ear: Secondary | ICD-10-CM | POA: Diagnosis not present

## 2017-08-21 DIAGNOSIS — H938X3 Other specified disorders of ear, bilateral: Secondary | ICD-10-CM | POA: Diagnosis not present

## 2017-08-21 DIAGNOSIS — I10 Essential (primary) hypertension: Secondary | ICD-10-CM | POA: Diagnosis not present

## 2017-09-06 ENCOUNTER — Encounter: Payer: Self-pay | Admitting: Urology

## 2017-09-06 ENCOUNTER — Ambulatory Visit
Admission: RE | Admit: 2017-09-06 | Discharge: 2017-09-06 | Disposition: A | Discharge status: Home / Self Care | Payer: PPO | Source: Ambulatory Visit | Attending: Urology | Admitting: Urology

## 2017-09-06 ENCOUNTER — Ambulatory Visit: Payer: PPO | Admitting: Urology

## 2017-09-06 VITALS — BP 133/68 | HR 102 | Ht 64.0 in | Wt 148.0 lb

## 2017-09-06 DIAGNOSIS — N200 Calculus of kidney: Secondary | ICD-10-CM | POA: Insufficient documentation

## 2017-09-06 NOTE — Progress Notes (Signed)
09/06/2017 9:57 AM   Sandra Jordan 21-Jul-1951 [id number]  Referring provider: Marinda Elk, MD [facility] [place], [place] [address]  Chief Complaint  Patient presents with  . Nephrolithiasis    28month KUB    HPI: 66 year old female with a 6 mm left lower pole kidney stone incidentally identified on CT scan 6 months ago.  She remains asymptomatic from the stone.  She had does occasionally have midline lower back pain exacerbated with activity.  She denies any flank pain, gross hematuria, or any other urinary symptoms.  No previous stone history prior to this.  KUB today shows stable 6 mm stone, unchanged from 6 months ago.   PMH: Past Medical History:  Diagnosis Date  . Allergy   . Colon polyp   . Hyperlipidemia   . Hypertension     Surgical History: Past Surgical History:  Procedure Laterality Date  . ABDOMINAL HYSTERECTOMY  1984  . BREAST BIOPSY Bilateral 1990   fibrocystic breast  . BREAST BIOPSY Right August 2014   fibrocystic breast  . CHOLECYSTECTOMY  2008  . COLONOSCOPY  2012, 2017   [facility] Medications:  Allergies as of 09/06/2017   No Known Allergies     Medication List        Accurate as of 09/06/17  9:57 AM. Always use your most recent med list.          cetirizine 10 MG tablet Commonly known as:  ZYRTEC Take 10 mg by mouth daily.   hydrochlorothiazide 25 MG tablet Commonly known as:  HYDRODIURIL Take 25 mg by mouth daily.   lovastatin 20 MG tablet Commonly known as:  MEVACOR Take 20 mg by mouth at bedtime.       Allergies: No Known Allergies  Family History: Family History  Problem Relation Age of Onset  . Cancer Mother        lung  . Cancer Sister        lung  . Colon cancer Father 35  . Prostate cancer Father     Social History:  reports that she quit smoking about 12 years ago. She quit after 15.00 years of use. she has never used smokeless tobacco. She reports  that she does not drink alcohol or use drugs.  ROS: UROLOGY Frequent Urination?: No Hard to postpone urination?: No Burning/pain with urination?: No Get up at night to urinate?: No Leakage of urine?: No Urine stream starts and stops?: No Trouble starting stream?: No Do you have to strain to urinate?: No Blood in urine?: No Urinary tract infection?: No Sexually transmitted disease?: No Injury to kidneys or bladder?: Yes Painful intercourse?: No Weak stream?: No Currently pregnant?: No Vaginal bleeding?: No Last menstrual period?: n  Gastrointestinal Nausea?: No Vomiting?: No Indigestion/heartburn?: No Diarrhea?: No Constipation?: No  Constitutional Fever: No Night sweats?: No Weight loss?: No Fatigue?: Yes  Skin Skin rash/lesions?: No Itching?: No  Eyes Blurred vision?: No Double vision?: No  Ears/Nose/Throat Sore throat?: No Sinus problems?: Yes  Hematologic/Lymphatic Swollen glands?: No Easy bruising?: No  Cardiovascular Leg swelling?: No Chest pain?: No  Respiratory Cough?: No Shortness of breath?: No  Endocrine Excessive thirst?: No  Musculoskeletal Back pain?: Yes Joint pain?: No  Neurological Headaches?: No Dizziness?: No  Psychologic Depression?: No Anxiety?: No  Physical Exam: BP 133/68   Pulse (!) 102   Ht 5\' 4"  (1.626 m)   Wt 148 lb (67.1 kg)   BMI 25.40  kg/m   Constitutional:  Alert and oriented, No acute distress.  Appears younger than stated age. HEENT: [place] AT, moist mucus membranes.  Trachea midline, no masses. Cardiovascular: No clubbing, cyanosis, or edema. Respiratory: Normal respiratory effort, no increased work of breathing. GI: Abdomen is soft, nontender, nondistended, no abdominal masses GU: No CVA tenderness.  Skin: No rashes, bruises or suspicious lesions. Neurologic: Grossly intact, no focal deficits, moving all 4 extremities. Psychiatric: Normal mood and affect.  Laboratory Data: Lab Results  Component  Value Date   WBC 5.9 01/18/2014   HGB 12.4 01/18/2014   HCT 38.6 01/18/2014   MCV 84 01/18/2014   PLT 287 01/18/2014    Lab Results  Component Value Date   CREATININE 0.75 01/26/2013    Urinalysis N/a  Pertinent Imaging: KUB personally reviewed today.  Left nonobstructing calculus remains at 6 mm in left lower pole, unchanged from CT scan 6 months ago.  Assessment & Plan:    1. Nephrolithiasis Incidental round 6 mm nonobstructing left lower pole stone, unchanged from 6 months ago.  She remains asymptomatic  She would like to continue observation for the stone.  But this seems reasonable given no interval change in the size of the stone.  Would recommend follow-up in 1-2 years with KUB.  Briefly reviewed stone diet.  Encouraged copious hydration.  Follow up sooner as needed.   Return in about 2 years (around 09/07/2019) for KUB.  Hollice Espy, MD  [facility] [address] [place], [place] [address] [phone]

## 2017-10-14 DIAGNOSIS — R509 Fever, unspecified: Secondary | ICD-10-CM | POA: Diagnosis not present

## 2017-10-14 DIAGNOSIS — J029 Acute pharyngitis, unspecified: Secondary | ICD-10-CM | POA: Diagnosis not present

## 2017-10-14 DIAGNOSIS — R69 Illness, unspecified: Secondary | ICD-10-CM | POA: Diagnosis not present

## 2017-11-20 DIAGNOSIS — I10 Essential (primary) hypertension: Secondary | ICD-10-CM | POA: Diagnosis not present

## 2017-11-20 DIAGNOSIS — R7303 Prediabetes: Secondary | ICD-10-CM | POA: Diagnosis not present

## 2017-11-20 DIAGNOSIS — E782 Mixed hyperlipidemia: Secondary | ICD-10-CM | POA: Diagnosis not present

## 2018-05-28 DIAGNOSIS — Z0000 Encounter for general adult medical examination without abnormal findings: Secondary | ICD-10-CM | POA: Diagnosis not present

## 2018-05-28 DIAGNOSIS — I10 Essential (primary) hypertension: Secondary | ICD-10-CM | POA: Diagnosis not present

## 2018-05-28 DIAGNOSIS — Z780 Asymptomatic menopausal state: Secondary | ICD-10-CM | POA: Diagnosis not present

## 2018-05-28 DIAGNOSIS — R7303 Prediabetes: Secondary | ICD-10-CM | POA: Diagnosis not present

## 2018-05-28 DIAGNOSIS — E782 Mixed hyperlipidemia: Secondary | ICD-10-CM | POA: Diagnosis not present

## 2018-05-28 DIAGNOSIS — Z1239 Encounter for other screening for malignant neoplasm of breast: Secondary | ICD-10-CM | POA: Diagnosis not present

## 2018-06-11 DIAGNOSIS — Z780 Asymptomatic menopausal state: Secondary | ICD-10-CM | POA: Diagnosis not present

## 2018-06-12 IMAGING — CR DG ABDOMEN 1V
1 series · 1 of 1 positions shown · non-contrast
Comparison: CT 02/19/2017

CLINICAL DATA: 66-year-old female with a history of right-sided
kidney stone

EXAM:
ABDOMEN - 1 VIEW

[abdomen kub]
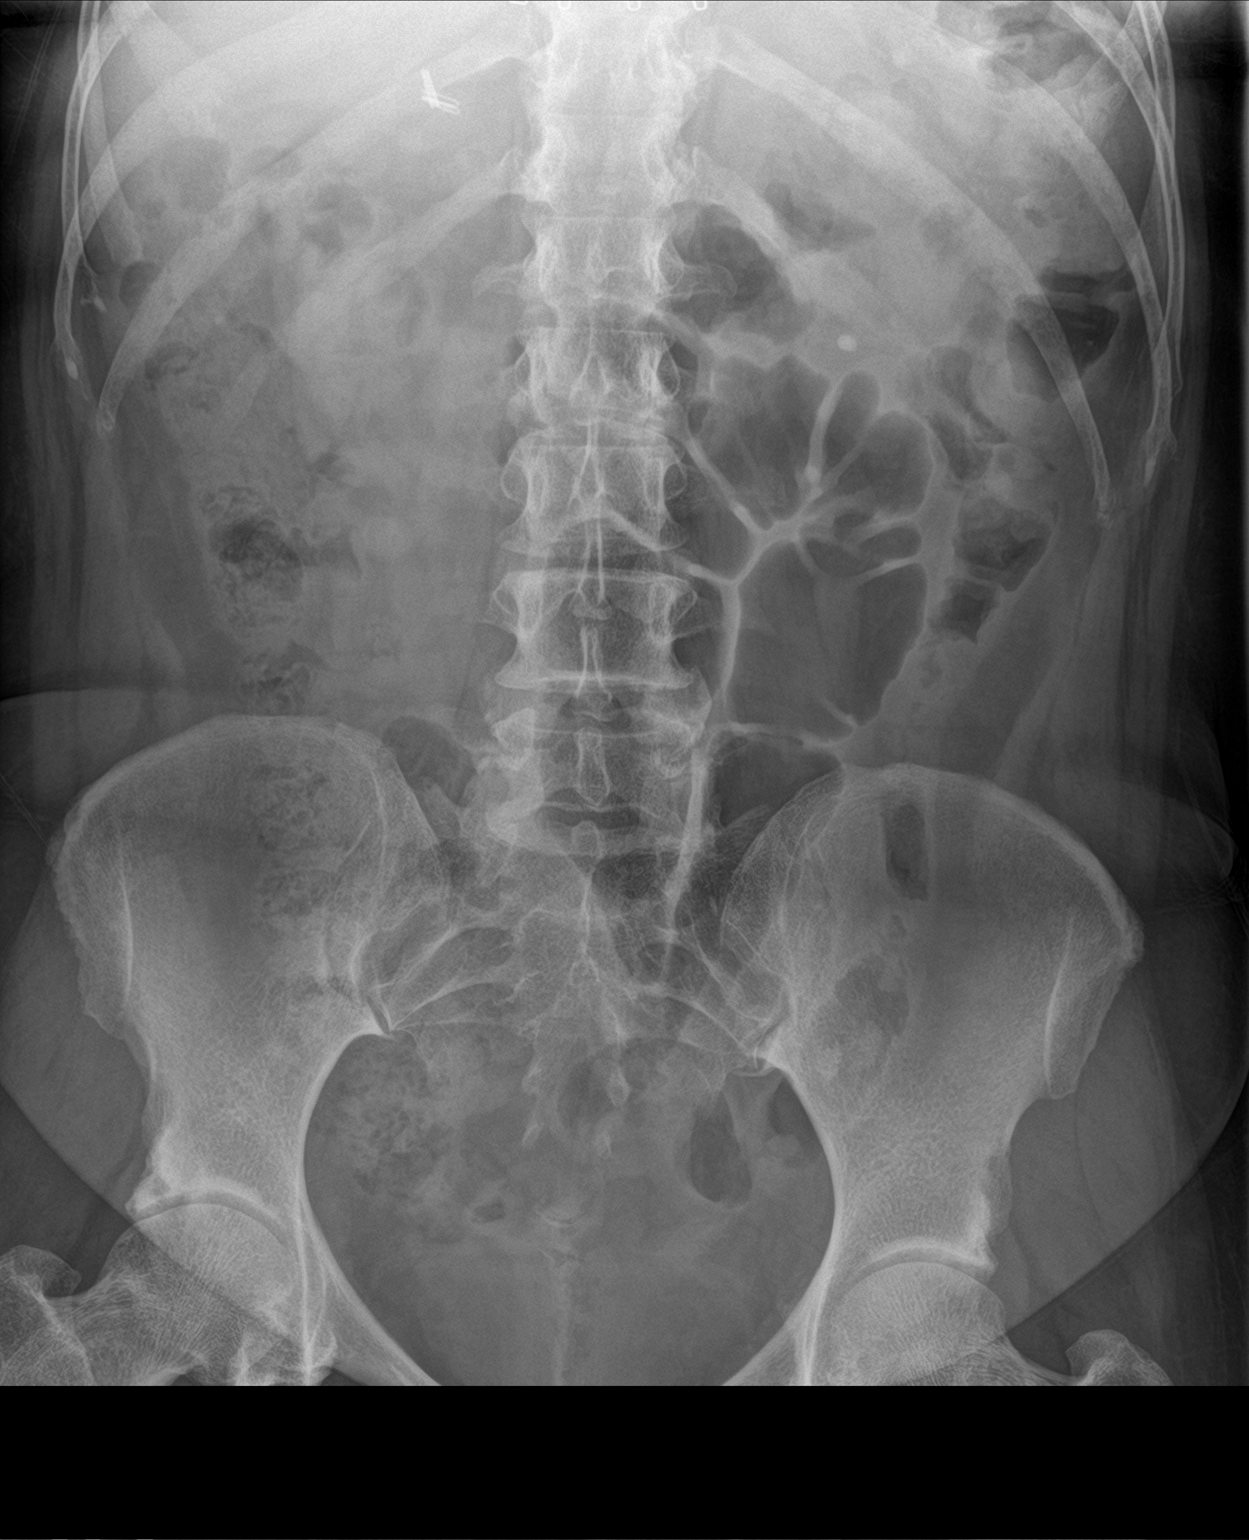

[1 of 1 positions shown; findings below may reference images not displayed]

FINDINGS: Gas within small bowel and colon.  No abnormal distention.

Rounded calcific density projecting over the left renal silhouette
measures 5 mm, similar to the comparison CT study.

Cholecystectomy.

No displaced fracture.  Degenerative changes of the bilateral hips

No calcific densities in the right abdomen.
IMPRESSION: Re- demonstration of left-sided nephrolithiasis.

## 2018-06-19 DIAGNOSIS — R399 Unspecified symptoms and signs involving the genitourinary system: Secondary | ICD-10-CM | POA: Diagnosis not present

## 2018-07-02 DIAGNOSIS — Z1231 Encounter for screening mammogram for malignant neoplasm of breast: Secondary | ICD-10-CM | POA: Diagnosis not present

## 2018-07-07 DIAGNOSIS — Z111 Encounter for screening for respiratory tuberculosis: Secondary | ICD-10-CM | POA: Diagnosis not present

## 2018-08-25 DIAGNOSIS — R0982 Postnasal drip: Secondary | ICD-10-CM | POA: Diagnosis not present

## 2018-08-25 DIAGNOSIS — J069 Acute upper respiratory infection, unspecified: Secondary | ICD-10-CM | POA: Diagnosis not present

## 2019-05-20 ENCOUNTER — Other Ambulatory Visit: Payer: Self-pay | Admitting: Physician Assistant

## 2019-05-20 DIAGNOSIS — Z1239 Encounter for other screening for malignant neoplasm of breast: Secondary | ICD-10-CM | POA: Diagnosis not present

## 2019-05-20 DIAGNOSIS — N6011 Diffuse cystic mastopathy of right breast: Secondary | ICD-10-CM | POA: Diagnosis not present

## 2019-05-20 DIAGNOSIS — R7303 Prediabetes: Secondary | ICD-10-CM | POA: Diagnosis not present

## 2019-05-20 DIAGNOSIS — Z1231 Encounter for screening mammogram for malignant neoplasm of breast: Secondary | ICD-10-CM

## 2019-05-20 DIAGNOSIS — Z0000 Encounter for general adult medical examination without abnormal findings: Secondary | ICD-10-CM | POA: Diagnosis not present

## 2019-05-20 DIAGNOSIS — H6123 Impacted cerumen, bilateral: Secondary | ICD-10-CM | POA: Diagnosis not present

## 2019-05-20 DIAGNOSIS — N6012 Diffuse cystic mastopathy of left breast: Secondary | ICD-10-CM | POA: Diagnosis not present

## 2019-05-20 DIAGNOSIS — E782 Mixed hyperlipidemia: Secondary | ICD-10-CM | POA: Diagnosis not present

## 2019-05-20 DIAGNOSIS — I10 Essential (primary) hypertension: Secondary | ICD-10-CM | POA: Diagnosis not present

## 2019-05-28 DIAGNOSIS — H6123 Impacted cerumen, bilateral: Secondary | ICD-10-CM | POA: Diagnosis not present

## 2019-05-28 DIAGNOSIS — H6062 Unspecified chronic otitis externa, left ear: Secondary | ICD-10-CM | POA: Diagnosis not present

## 2019-07-03 DIAGNOSIS — Z1231 Encounter for screening mammogram for malignant neoplasm of breast: Secondary | ICD-10-CM | POA: Diagnosis not present

## 2019-07-22 DIAGNOSIS — R928 Other abnormal and inconclusive findings on diagnostic imaging of breast: Secondary | ICD-10-CM | POA: Diagnosis not present

## 2019-07-22 DIAGNOSIS — N6321 Unspecified lump in the left breast, upper outer quadrant: Secondary | ICD-10-CM | POA: Diagnosis not present

## 2019-07-22 DIAGNOSIS — N6322 Unspecified lump in the left breast, upper inner quadrant: Secondary | ICD-10-CM | POA: Diagnosis not present

## 2019-09-07 ENCOUNTER — Telehealth: Payer: Self-pay | Admitting: *Deleted

## 2019-09-07 ENCOUNTER — Other Ambulatory Visit: Payer: Self-pay | Admitting: *Deleted

## 2019-09-07 DIAGNOSIS — N200 Calculus of kidney: Secondary | ICD-10-CM

## 2019-09-07 NOTE — Telephone Encounter (Signed)
Left a VM to get KUB completed prior to her appointment with Dr. Erlene Quan 09/08/19-asked to call back with any questions.

## 2019-09-08 ENCOUNTER — Encounter: Payer: Self-pay | Admitting: Urology

## 2019-09-08 ENCOUNTER — Ambulatory Visit
Admission: RE | Admit: 2019-09-08 | Discharge: 2019-09-08 | Disposition: A | Discharge status: Home / Self Care | Payer: PPO | Source: Ambulatory Visit | Attending: Urology | Admitting: Urology

## 2019-09-08 ENCOUNTER — Ambulatory Visit (INDEPENDENT_AMBULATORY_CARE_PROVIDER_SITE_OTHER): Payer: PPO | Admitting: Urology

## 2019-09-08 ENCOUNTER — Other Ambulatory Visit: Payer: Self-pay

## 2019-09-08 VITALS — BP 137/83 | HR 90 | Ht 64.0 in | Wt 144.0 lb

## 2019-09-08 DIAGNOSIS — N200 Calculus of kidney: Secondary | ICD-10-CM

## 2019-09-08 DIAGNOSIS — M5441 Lumbago with sciatica, right side: Secondary | ICD-10-CM | POA: Diagnosis not present

## 2019-09-08 DIAGNOSIS — G8929 Other chronic pain: Secondary | ICD-10-CM

## 2019-09-08 NOTE — Progress Notes (Signed)
09/08/2019 10:41 AM   Sandra Jordan Jul 06, 1951 [id number]  Referring provider: Marinda Elk, MD [place] [facility]  [place] [address]  Chief Complaint  Patient presents with  . Follow-up    KUB    HPI: 68 year old female returns today for routine follow-up for history of kidney stones.  She was last seen in 08/2017.  2 years ago, she had a 6 mm stone in her left lower pole.  KUB today indicates that this is stable and unchanged.  She denies any flank pain or gross hematuria.  She denied any previous stone episodes or stone surgeries.  She drinks plenty water.  She reports today that she has been having pain first from her stone on a daily basis.  She points to her lower right back and reports that it radiates down her leg.  Its worse in the morning when she is stiff and improves with stretching.  She has been recently started on meloxicam.  She now recalls that her primary care indicated that this was likely sciatica.    PMH: Past Medical History:  Diagnosis Date  . Allergy   . Colon polyp   . Hyperlipidemia   . Hypertension     Surgical History: Past Surgical History:  Procedure Laterality Date  . ABDOMINAL HYSTERECTOMY  1984  . BREAST BIOPSY Bilateral 1990   fibrocystic breast  . BREAST BIOPSY Right August 2014   fibrocystic breast  . CHOLECYSTECTOMY  2008  . COLONOSCOPY  2012, 2017   [facility] Medications:  Allergies as of 09/08/2019   No Known Allergies     Medication List       Accurate as of September 08, 2019 10:41 AM. If you have any questions, ask your nurse or doctor.        cetirizine 10 MG tablet Commonly known as: ZYRTEC Take 10 mg by mouth daily.   hydrochlorothiazide 25 MG tablet Commonly known as: HYDRODIURIL Take 25 mg by mouth daily.   lovastatin 20 MG tablet Commonly known as: MEVACOR Take 20 mg by mouth at bedtime.       Allergies: No Known Allergies  Family History:  Family History  Problem Relation Age of Onset  . Cancer Mother        lung  . Cancer Sister        lung  . Colon cancer Father 49  . Prostate cancer Father     Social History:  reports that she quit smoking about 14 years ago. She quit after 15.00 years of use. She has never used smokeless tobacco. She reports that she does not drink alcohol or use drugs.  ROS: UROLOGY Frequent Urination?: No Hard to postpone urination?: No Burning/pain with urination?: No Get up at night to urinate?: No Leakage of urine?: No Urine stream starts and stops?: No Trouble starting stream?: No Do you have to strain to urinate?: No Blood in urine?: No Urinary tract infection?: No Sexually transmitted disease?: No Injury to kidneys or bladder?: No Painful intercourse?: No Weak stream?: No Currently pregnant?: No Vaginal bleeding?: No Last menstrual period?: N  Gastrointestinal Nausea?: No Vomiting?: No Indigestion/heartburn?: No Diarrhea?: No Constipation?: No  Constitutional Fever: No Night sweats?: No Weight loss?: No Fatigue?: No  Skin Skin rash/lesions?: No Itching?: No  Eyes Blurred vision?: No Double vision?: No  Ears/Nose/Throat Sore throat?: No Sinus problems?: No  Hematologic/Lymphatic Swollen glands?: No Easy bruising?: No  Cardiovascular Leg swelling?: No  Chest pain?: No  Respiratory Cough?: No Shortness of breath?: No  Endocrine Excessive thirst?: No  Musculoskeletal Back pain?: No Joint pain?: No  Neurological Headaches?: No Dizziness?: No  Psychologic Depression?: No Anxiety?: No  Physical Exam: BP 137/83   Pulse 90   Ht 5\' 4"  (1.626 m)   Wt 144 lb (65.3 kg)   BMI 24.72 kg/m   Constitutional:  Alert and oriented, No acute distress. HEENT: [place] AT, moist mucus membranes.  Trachea midline, no masses. Cardiovascular: No clubbing, cyanosis, or edema. Respiratory: Normal respiratory effort, no increased work of breathing. Skin: No rashes,  bruises or suspicious lesions. Neurologic: Grossly intact, no focal deficits, moving all 4 extremities. Psychiatric: Normal mood and affect.  Laboratory Data: Lab Results  Component Value Date   WBC 5.9 01/18/2014   HGB 12.4 01/18/2014   HCT 38.6 01/18/2014   MCV 84 01/18/2014   PLT 287 01/18/2014    Lab Results  Component Value Date   CREATININE 0.75 01/26/2013    Pertinent Imaging: KUB today was personally reviewed and also with the patient.  This was also compared to previous KUB about 2 years ago.  6 mm incidental right lower pole stone is unchanged in the same position.  There is no other stones or evidence of interval growth.  Agree with radiologic interpretation.  Assessment & Plan:    1. Nephrolithiasis Stable asymptomatic incidental left lower pole stone, unchanged x2 years  She is asymptomatic from this this I have not recommended any intervention.  Alternatives including ureteroscopy and ESWL were reviewed again today.  She is not interested in surgical intervention.  We reviewed stone diet.  She will follow-up as needed at this point.  All questions answered.  2. Chronic right-sided low back pain with right-sided sciatica Pain unrelated to stone, advised to follow-up with PCP  Follow-up as needed  Hollice Espy, MD  [facility] [address] [place], [place] [address] [phone]

## 2021-06-12 ENCOUNTER — Emergency Department: Payer: Medicare Other

## 2021-06-12 ENCOUNTER — Other Ambulatory Visit: Payer: Self-pay

## 2021-06-12 ENCOUNTER — Observation Stay
Admission: EM | Admit: 2021-06-12 | Discharge: 2021-06-13 | Disposition: A | Discharge status: Home / Self Care | Payer: Medicare Other | Attending: Family Medicine | Admitting: Family Medicine

## 2021-06-12 ENCOUNTER — Observation Stay: Payer: Medicare Other

## 2021-06-12 DIAGNOSIS — G9389 Other specified disorders of brain: Secondary | ICD-10-CM | POA: Diagnosis not present

## 2021-06-12 DIAGNOSIS — R42 Dizziness and giddiness: Secondary | ICD-10-CM | POA: Diagnosis present

## 2021-06-12 DIAGNOSIS — R918 Other nonspecific abnormal finding of lung field: Secondary | ICD-10-CM | POA: Diagnosis not present

## 2021-06-12 DIAGNOSIS — I10 Essential (primary) hypertension: Secondary | ICD-10-CM | POA: Diagnosis present

## 2021-06-12 DIAGNOSIS — C7931 Secondary malignant neoplasm of brain: Secondary | ICD-10-CM | POA: Insufficient documentation

## 2021-06-12 DIAGNOSIS — Z20822 Contact with and (suspected) exposure to covid-19: Secondary | ICD-10-CM | POA: Insufficient documentation

## 2021-06-12 DIAGNOSIS — E785 Hyperlipidemia, unspecified: Secondary | ICD-10-CM | POA: Diagnosis present

## 2021-06-12 DIAGNOSIS — C3411 Malignant neoplasm of upper lobe, right bronchus or lung: Secondary | ICD-10-CM | POA: Diagnosis not present

## 2021-06-12 DIAGNOSIS — Z87891 Personal history of nicotine dependence: Secondary | ICD-10-CM | POA: Diagnosis not present

## 2021-06-12 DIAGNOSIS — R739 Hyperglycemia, unspecified: Secondary | ICD-10-CM

## 2021-06-12 DIAGNOSIS — R112 Nausea with vomiting, unspecified: Secondary | ICD-10-CM

## 2021-06-12 DIAGNOSIS — Z79899 Other long term (current) drug therapy: Secondary | ICD-10-CM | POA: Diagnosis not present

## 2021-06-12 DIAGNOSIS — E876 Hypokalemia: Secondary | ICD-10-CM | POA: Diagnosis not present

## 2021-06-12 DIAGNOSIS — Z801 Family history of malignant neoplasm of trachea, bronchus and lung: Secondary | ICD-10-CM | POA: Diagnosis not present

## 2021-06-12 LAB — CBC
HCT: 43.4 % (ref 36.0–46.0)
Hemoglobin: 14.6 g/dL (ref 12.0–15.0)
MCH: 28.3 pg (ref 26.0–34.0)
MCHC: 33.6 g/dL (ref 30.0–36.0)
MCV: 84.3 fL (ref 80.0–100.0)
Platelets: 352 10*3/uL (ref 150–400)
RBC: 5.15 MIL/uL — ABNORMAL HIGH (ref 3.87–5.11)
RDW: 13.9 % (ref 11.5–15.5)
WBC: 8.1 10*3/uL (ref 4.0–10.5)
nRBC: 0 % (ref 0.0–0.2)

## 2021-06-12 LAB — TROPONIN I (HIGH SENSITIVITY)
Troponin I (High Sensitivity): 6 ng/L (ref <18)
Troponin I (High Sensitivity): 6 ng/L (ref <18)

## 2021-06-12 LAB — BASIC METABOLIC PANEL
Anion gap: 11 (ref 5–15)
BUN: 8 mg/dL (ref 8–23)
CO2: 27 mmol/L (ref 22–32)
Calcium: 9.6 mg/dL (ref 8.9–10.3)
Chloride: 100 mmol/L (ref 98–111)
Creatinine, Ser: 0.8 mg/dL (ref 0.44–1.00)
GFR, Estimated: >60 mL/min (ref >60)
Glucose, Bld: 114 mg/dL — ABNORMAL HIGH (ref 70–99)
Potassium: 2.7 mmol/L — CL (ref 3.5–5.1)
Sodium: 138 mmol/L (ref 135–145)

## 2021-06-12 LAB — HEMOGLOBIN A1C
Hgb A1c MFr Bld: 5.9 % — ABNORMAL HIGH (ref 4.8–5.6)
Mean Plasma Glucose: 122.63 mg/dL

## 2021-06-12 LAB — HEPATIC FUNCTION PANEL
ALT: 41 U/L (ref 0–44)
AST: 36 U/L (ref 15–41)
Albumin: 4 g/dL (ref 3.5–5.0)
Alkaline Phosphatase: 100 U/L (ref 38–126)
Bilirubin, Direct: <0.1 mg/dL (ref 0.0–0.2)
Total Bilirubin: 0.9 mg/dL (ref 0.3–1.2)
Total Protein: 7.7 g/dL (ref 6.5–8.1)

## 2021-06-12 LAB — RESP PANEL BY RT-PCR (FLU A&B, COVID) ARPGX2
Influenza A by PCR: NEGATIVE
Influenza B by PCR: NEGATIVE
SARS Coronavirus 2 by RT PCR: NEGATIVE

## 2021-06-12 LAB — LIPASE, BLOOD: Lipase: 45 U/L (ref 11–51)

## 2021-06-12 LAB — MAGNESIUM: Magnesium: 2.1 mg/dL (ref 1.7–2.4)

## 2021-06-12 LAB — GLUCOSE, CAPILLARY: Glucose-Capillary: 157 mg/dL — ABNORMAL HIGH (ref 70–99)

## 2021-06-12 MED ORDER — POTASSIUM CHLORIDE CRYS ER 20 MEQ PO TBCR
40.0000 meq | EXTENDED_RELEASE_TABLET | Freq: Once | ORAL | Status: AC
  Administered 2021-06-12: 40 meq via ORAL
  Filled 2021-06-12: qty 2

## 2021-06-12 MED ORDER — ONDANSETRON HCL 4 MG/2ML IJ SOLN
4.0000 mg | Freq: Once | INTRAMUSCULAR | Status: AC
  Administered 2021-06-12: 4 mg via INTRAVENOUS
  Filled 2021-06-12: qty 2

## 2021-06-12 MED ORDER — GADOPENTETATE DIMEGLUMINE 469.01 MG/ML IV SOLN
5.0000 mL | Freq: Once | INTRAVENOUS | Status: AC | PRN
  Administered 2021-06-12: 5 mL via INTRAVENOUS
  Filled 2021-06-12: qty 5

## 2021-06-12 MED ORDER — SODIUM CHLORIDE 0.9 % IV BOLUS
500.0000 mL | Freq: Once | INTRAVENOUS | Status: AC
  Administered 2021-06-12: 500 mL via INTRAVENOUS

## 2021-06-12 MED ORDER — IOHEXOL 350 MG/ML SOLN
80.0000 mL | Freq: Once | INTRAVENOUS | Status: AC | PRN
  Administered 2021-06-12: 80 mL via INTRAVENOUS
  Filled 2021-06-12: qty 80

## 2021-06-12 MED ORDER — ONDANSETRON HCL 4 MG/2ML IJ SOLN: 4.0000 mg | Freq: Four times a day (QID) | INTRAMUSCULAR | Status: DC | PRN

## 2021-06-12 MED ORDER — DEXAMETHASONE SODIUM PHOSPHATE 10 MG/ML IJ SOLN
10.0000 mg | INTRAMUSCULAR | Status: DC
  Administered 2021-06-13: 10 mg via INTRAVENOUS
  Filled 2021-06-12: qty 1

## 2021-06-12 MED ORDER — INSULIN ASPART 100 UNIT/ML IJ SOLN
0.0000 [IU] | Freq: Three times a day (TID) | INTRAMUSCULAR | Status: DC
  Administered 2021-06-12: 17:00:00 1 [IU] via SUBCUTANEOUS
  Filled 2021-06-12: qty 1

## 2021-06-12 MED ORDER — DEXAMETHASONE SODIUM PHOSPHATE 10 MG/ML IJ SOLN
10.0000 mg | Freq: Once | INTRAMUSCULAR | Status: AC
  Administered 2021-06-12: 10 mg via INTRAVENOUS
  Filled 2021-06-12: qty 1

## 2021-06-12 MED ORDER — POTASSIUM CHLORIDE 10 MEQ/100ML IV SOLN
10.0000 meq | INTRAVENOUS | Status: AC
Start: 2021-06-12 — End: 2021-06-12
  Administered 2021-06-12 (×3): 10 meq via INTRAVENOUS
  Filled 2021-06-12 (×3): qty 100

## 2021-06-12 NOTE — ED Notes (Signed)
Critical result called: potassium 2.7. provider notified at this time.

## 2021-06-12 NOTE — ED Provider Notes (Signed)
[facility] Emergency Department Provider Note    Event Date/Time   First MD Initiated Contact with Patient 06/12/21 1118     (approximate)  I have reviewed the triage vital signs and the nursing notes.   HISTORY  Chief Complaint Dizziness    HPI Sandra Jordan is a 70 y.o. female the below listed past medical history presents to the ER for evaluation of dizziness and lightheadedness having trouble walking since Thursday.  Was told on Thursday that her potassium was low and was prescribed oral potassium supplement but has been having trouble keeping it down.  She not having any vomiting today did have few episodes yesterday that she thinks is related to taking the potassium pills.  Denies any diarrhea.  No recent medication changes.  Denies any chest pain or pressure no shortness of breath.  No fevers no dysuria.  Past Medical History:  Diagnosis Date   Allergy    Colon polyp    Hyperlipidemia    Hypertension    Family History  Problem Relation Age of Onset   Cancer Mother        lung   Cancer Sister        lung   Colon cancer Father 78   Prostate cancer Father    Past Surgical History:  Procedure Laterality Date   ABDOMINAL HYSTERECTOMY  1984   BREAST BIOPSY Bilateral 1990   fibrocystic breast   BREAST BIOPSY Right August 2014   fibrocystic breast   CHOLECYSTECTOMY  2008   COLONOSCOPY  2012, 2017   [name]   Patient Active Problem List   Diagnosis Date Noted   Brain mass 06/12/2021   Hyperlipidemia 03/08/2017   Hypertension 03/08/2017   AR (allergic rhinitis) 05/17/2016   Colon polyps 05/17/2016   Fibrocystic breast changes, bilateral 05/11/2014      Prior to Admission medications   Medication Sig Start Date End Date Taking? Authorizing Provider  cetirizine (ZYRTEC) 10 MG tablet Take 10 mg by mouth daily.    [provider]  hydrochlorothiazide (HYDRODIURIL) 25 MG tablet Take 25 mg by mouth daily.    [provider]  lovastatin (MEVACOR) 20 MG tablet Take 20 mg by mouth at bedtime.    [provider]    Allergies Patient has no known allergies.    Social History Social History   Tobacco Use   Smoking status: Former    Years: 15.00    Types: Cigarettes    Quit date: 10/08/2004    Years since quitting: 16.6   Smokeless tobacco: Never  Substance Use Topics   Alcohol use: No    Alcohol/week: 0.0 standard drinks   Drug use: No    Review of Systems Patient denies headaches, rhinorrhea, blurry vision, numbness, shortness of breath, chest pain, edema, cough, abdominal pain, nausea, vomiting, diarrhea, dysuria, fevers, rashes or hallucinations unless otherwise stated above in HPI. ____________________________________________   PHYSICAL EXAM:  VITAL SIGNS: Vitals:   06/12/21 1224 06/12/21 1225  BP:    Pulse: (!) 110   Resp:  16  Temp:    SpO2: 94%     Constitutional: Alert and oriented.  Eyes: Conjunctivae are normal.  Head: Atraumatic. Nose: No congestion/rhinnorhea. Mouth/Throat: Mucous membranes are moist.   Neck: No stridor. Painless ROM.  Cardiovascular: Normal rate, regular rhythm. Grossly normal heart sounds.  Good peripheral circulation. Respiratory: Normal respiratory effort.  No retractions. Lungs CTAB. Gastrointestinal: Soft and nontender. No distention. No abdominal bruits. No  CVA tenderness. Genitourinary:  Musculoskeletal: No lower extremity tenderness nor edema.  No joint effusions. Neurologic:  Normal speech and language. No gross focal neurologic deficits are appreciated. No facial droop Skin:  Skin is warm, dry and intact. No rash noted. Psychiatric: Mood and affect are normal. Speech and behavior are normal.  ____________________________________________   LABS (all labs ordered are listed, but only abnormal results are displayed)  Results for orders placed or performed during the hospital encounter of 06/12/21 (from the past 24 hour(s))   Basic metabolic panel     Status: Abnormal   Collection Time: 06/12/21 10:56 AM  Result Value Ref Range   Sodium 138 135 - 145 mmol/L   Potassium 2.7 (LL) 3.5 - 5.1 mmol/L   Chloride 100 98 - 111 mmol/L   CO2 27 22 - 32 mmol/L   Glucose, Bld 114 (H) 70 - 99 mg/dL   BUN 8 8 - 23 mg/dL   Creatinine, Ser 0.80 0.44 - 1.00 mg/dL   Calcium 9.6 8.9 - 10.3 mg/dL   GFR, Estimated >60 >60 mL/min   Anion gap 11 5 - 15  CBC     Status: Abnormal   Collection Time: 06/12/21 10:56 AM  Result Value Ref Range   WBC 8.1 4.0 - 10.5 K/uL   RBC 5.15 (H) 3.87 - 5.11 MIL/uL   Hemoglobin 14.6 12.0 - 15.0 g/dL   HCT 43.4 36.0 - 46.0 %   MCV 84.3 80.0 - 100.0 fL   MCH 28.3 26.0 - 34.0 pg   MCHC 33.6 30.0 - 36.0 g/dL   RDW 13.9 11.5 - 15.5 %   Platelets 352 150 - 400 K/uL   nRBC 0.0 0.0 - 0.2 %  Hepatic function panel     Status: None   Collection Time: 06/12/21 10:56 AM  Result Value Ref Range   Total Protein 7.7 6.5 - 8.1 g/dL   Albumin 4.0 3.5 - 5.0 g/dL   AST 36 15 - 41 U/L   ALT 41 0 - 44 U/L   Alkaline Phosphatase 100 38 - 126 U/L   Total Bilirubin 0.9 0.3 - 1.2 mg/dL   Bilirubin, Direct <0.1 0.0 - 0.2 mg/dL   Indirect Bilirubin NOT CALCULATED 0.3 - 0.9 mg/dL  Lipase, blood     Status: None   Collection Time: 06/12/21 10:56 AM  Result Value Ref Range   Lipase 45 11 - 51 U/L  Magnesium     Status: None   Collection Time: 06/12/21 10:56 AM  Result Value Ref Range   Magnesium 2.1 1.7 - 2.4 mg/dL  Troponin I (High Sensitivity)     Status: None   Collection Time: 06/12/21 10:56 AM  Result Value Ref Range   Troponin I (High Sensitivity) 6 <18 ng/L  Resp Panel by RT-PCR (Flu A&B, Covid) Nasopharyngeal Swab     Status: None   Collection Time: 06/12/21 11:57 AM   Specimen: Nasopharyngeal Swab; Nasopharyngeal(NP) swabs in vial transport medium  Result Value Ref Range   SARS Coronavirus 2 by RT PCR NEGATIVE NEGATIVE   Influenza A by PCR NEGATIVE NEGATIVE   Influenza B by PCR NEGATIVE  NEGATIVE   ____________________________________________  EKG My review and personal interpretation at Time: 11:01   Indication: dizziness  Rate: 120  Rhythm: sinus Axis: normal Other: nonspecific st and t wave abn, no stemi ____________________________________________  RADIOLOGY  I personally reviewed all radiographic images ordered to evaluate for the above acute complaints and reviewed radiology reports and findings.  These findings were personally discussed with the patient.  Please see medical record for radiology report.  ____________________________________________   PROCEDURES  Procedure(s) performed:  .Critical Care  Date/Time: 06/12/2021 1:33 PM Performed by: Merlyn Lot, MD Authorized by: Merlyn Lot, MD   Critical care provider statement:    Critical care time (minutes):  15   Critical care time was exclusive of:  Separately billable procedures and treating other patients   Critical care was necessary to treat or prevent imminent or life-threatening deterioration of the following conditions:  CNS failure or compromise   Critical care was time spent personally by me on the following activities:  Development of treatment plan with patient or surrogate, discussions with consultants, evaluation of patient's response to treatment, examination of patient, obtaining history from patient or surrogate, ordering and performing treatments and interventions, ordering and review of laboratory studies, ordering and review of radiographic studies, pulse oximetry, re-evaluation of patient's condition and review of old charts    Critical Care performed: yes ____________________________________________   INITIAL IMPRESSION / Mission / ED COURSE  Pertinent labs & imaging results that were available during my care of the patient were reviewed by me and considered in my medical decision making (see chart for details).   DDX: electrolyte abn, dehydration,  medication effect, aki,  enteritis, cva, mass, uti, acs  Alvira Hecht is a 70 y.o. who presents to the ED with presentation as described above.  Patient noted to be hyperkalemic.  Describing vague dizziness no focal deficit on exam CT imaging ordered for the but differential.  Will order x-ray imaging will replete potassium.  Clinical Course as of 06/12/21 1333  Mon Jun 12, 2021  1155 Chest x-ray concerning for upper lobar collapse suspicious for hilar mass.  Review of CT does show some irregularity concerning for possible underlying mass lesion and likely mets. [PR]  1230 CT imaging also shows evidence of right hilar mass which I suspect is the primary.  Given her hypokalemia and CT imaging with evidence of vasogenic edema did consult Dr. Grayland Ormond of oncology who recommends IV Decadron supportive care and agrees with plan for admission to hospitalist.  Patient agreeable plan. [PR]    Clinical Course User Index [PR] Merlyn Lot, MD    The patient was evaluated in Emergency Department today for the symptoms described in the history of present illness. He/she was evaluated in the context of the global COVID-19 pandemic, which necessitated consideration that the patient might be at risk for infection with the SARS-CoV-2 virus that causes COVID-19. Institutional protocols and algorithms that pertain to the evaluation of patients at risk for COVID-19 are in a state of rapid change based on information released by regulatory bodies including the CDC and federal and state organizations. These policies and algorithms were followed during the patient's care in the ED.  As part of my medical decision making, I reviewed the following data within the [facility] notes reviewed and incorporated, Labs reviewed, notes from prior ED visits and [place] Controlled Substance Database   ____________________________________________   FINAL CLINICAL IMPRESSION(S) / ED  DIAGNOSES  Final diagnoses:  Nausea & vomiting  Hilar mass  Mass of brain  Hypokalemia      NEW MEDICATIONS STARTED DURING THIS VISIT:  New Prescriptions   No medications on file     Note:  This document was prepared using Dragon voice recognition software and may include unintentional dictation errors.    Merlyn Lot, MD 06/12/21  1333  

## 2021-06-12 NOTE — ED Triage Notes (Signed)
Pt to ED for dizziness and emesos . States she was here Thursday and prescribed potassium but she has not been able to keep It down.

## 2021-06-12 NOTE — H&P (Addendum)
History and Physical    Sandra Jordan YKZ:[id number] DOB: 17-Dec-1950 DOA: 06/12/2021  PCP: Marinda Elk, MD  Patient coming from: Home  I have personally briefly reviewed patient's old medical records in [place]  Chief Complaint: dizziness  HPI: Sandra Jordan is a 70 y.o. female with medical history significant for hypertension and hyperlipidemia who presents with concerns of dizziness and nausea.  Patient reports that about 4 days ago she began to note nausea worse with movement.  She then presented to her primary care physician and was told she had low potassium.  She was started on oral potassium but could not tolerate it due to nausea and vomiting.  States she has not been able to eat or take her medications for the past 3 days due to nausea.  She presents again today with similar symptoms.    In the ED, she was afebrile, tachycardic with heart rate of 110 and normotensive on room air.  She was noted on CT head to have multiple masslike lesions with vasogenic edema.  Chest x-ray showing a right upper lobe collapse.  CT chest and abdomen pending at time of admission.  CBC unremarkable.  Potassium of 2.7, magnesium 2.1, BG of 114, normal LFTs.  Patient reports that she has history of tobacco use in the past.  Started smoking since she was 70 years old at half a pack per day.  Quit about 10 years ago.  Denies alcohol or illicit drug use.  She used to work in Teacher, adult education and work factories.  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  Denies any chemical exposures.  She currently lives with her brother.  States her family has strong history of cancer in her mother with lung cancer, father with prostate cancer, sister with metastatic cancer with unknown primary and uncle with pharyngeal cancer.  Patient was started on IV dexamethasone and IV and oral potassium. ED physician Dr. Merlyn Lot spoke with oncology Dr. Grayland Ormond who will see her in  consultation tomorrow.  Review of Systems: Constitutional: No Weight Change, No Fever ENT/Mouth: No sore throat, No Rhinorrhea Eyes: No Eye Pain, No Vision Changes Cardiovascular: No Chest Pain, no SOB, No PND, No Dyspnea on Exertion, No Orthopnea, No Claudication, No Edema, No Palpitations Respiratory: No Cough, No Sputum, No Wheezing, no Dyspnea  Gastrointestinal: No Nausea, No Vomiting, No Diarrhea, No Constipation, No Pain Genitourinary: no Urinary Incontinence, No Urgency, No Flank Pain Musculoskeletal: No Arthralgias, No Myalgias Skin: No Skin Lesions, No Pruritus, Neuro: no Weakness, No Numbness,  No Loss of Consciousness, No Syncope Psych: No Anxiety/Panic, No Depression, no decrease appetite Heme/Lymph: No Bruising, No Bleeding  Past Medical History:  Diagnosis Date   Allergy    Colon polyp    Hyperlipidemia    Hypertension     Past Surgical History:  Procedure Laterality Date   ABDOMINAL HYSTERECTOMY  1984   BREAST BIOPSY Bilateral 1990   fibrocystic breast   BREAST BIOPSY Right August 2014   fibrocystic breast   CHOLECYSTECTOMY  2008   COLONOSCOPY  2012, 2017   [facility]     reports that she quit smoking about 16 years ago. She has never used smokeless tobacco. She reports that she does not drink alcohol and does not use drugs. Social History  No Known Allergies  Family History  Problem Relation Age of Onset   Cancer Mother        lung   Cancer Sister  lung   Colon cancer Father 33   Prostate cancer Father      Prior to Admission medications   Medication Sig Start Date End Date Taking? Authorizing Provider  cetirizine (ZYRTEC) 10 MG tablet Take 10 mg by mouth daily.    [provider]  hydrochlorothiazide (HYDRODIURIL) 25 MG tablet Take 25 mg by mouth daily.    [provider]  lovastatin (MEVACOR) 20 MG tablet Take 20 mg by mouth at bedtime.    [provider]    Physical Exam: Vitals:   06/12/21 1058 06/12/21 1223  06/12/21 1224 06/12/21 1225  BP: (!) 135/106 125/87    Pulse: (!) 110 (!) 106 (!) 110   Resp: 18 16  16   Temp: 98.4 F (36.9 C)     TempSrc: Oral     SpO2: 99% 94% 94%   Weight:      Height:        Constitutional: NAD, calm, comfortable, female younger than her stated age laying at approximately 30 degree incline in bed Vitals:   06/12/21 1058 06/12/21 1223 06/12/21 1224 06/12/21 1225  BP: (!) 135/106 125/87    Pulse: (!) 110 (!) 106 (!) 110   Resp: 18 16  16   Temp: 98.4 F (36.9 C)     TempSrc: Oral     SpO2: 99% 94% 94%   Weight:      Height:       Eyes: PERRL, lids and conjunctivae normal ENMT: Mucous membranes are moist.  Neck: normal, supple Respiratory: clear to auscultation bilaterally, no wheezing, no crackles. Normal respiratory effort. No accessory muscle use.  Cardiovascular: Regular rate and rhythm, no murmurs / rubs / gallops. No extremity edema.  Abdomen: no tenderness, no masses palpated.  Bowel sounds positive.  Musculoskeletal: no clubbing / cyanosis. No joint deformity upper and lower extremities. Good ROM, no contractures. Normal muscle tone.  Skin: no rashes, lesions, ulcers. No induration Neurologic: CN 2-12 grossly intact. Sensation intact,  Strength 5/5 in all 4.  Psychiatric: Normal judgment and insight. Alert and oriented x 3. Normal mood.    Labs on Admission: I have personally reviewed following labs and imaging studies  CBC: Recent Labs  Lab 06/12/21 1056  WBC 8.1  HGB 14.6  HCT 43.4  MCV 84.3  PLT 510   Basic Metabolic Panel: Recent Labs  Lab 06/12/21 1056  NA 138  K 2.7*  CL 100  CO2 27  GLUCOSE 114*  BUN 8  CREATININE 0.80  CALCIUM 9.6  MG 2.1   GFR: Estimated Creatinine Clearance: 54.9 mL/min (by C-G formula based on SCr of 0.8 mg/dL). Liver Function Tests: Recent Labs  Lab 06/12/21 1056  AST 36  ALT 41  ALKPHOS 100  BILITOT 0.9  PROT 7.7  ALBUMIN 4.0   Recent Labs  Lab 06/12/21 1056  LIPASE 45   No  results for input(s): AMMONIA in the last 168 hours. Coagulation Profile: No results for input(s): INR, PROTIME in the last 168 hours. Cardiac Enzymes: No results for input(s): CKTOTAL, CKMB, CKMBINDEX, TROPONINI in the last 168 hours. BNP (last 3 results) No results for input(s): PROBNP in the last 8760 hours. HbA1C: No results for input(s): HGBA1C in the last 72 hours. CBG: No results for input(s): GLUCAP in the last 168 hours. Lipid Profile: No results for input(s): CHOL, HDL, LDLCALC, TRIG, CHOLHDL, LDLDIRECT in the last 72 hours. Thyroid Function Tests: No results for input(s): TSH, T4TOTAL, FREET4, T3FREE, THYROIDAB in the last 72  hours. Anemia Panel: No results for input(s): VITAMINB12, FOLATE, FERRITIN, TIBC, IRON, RETICCTPCT in the last 72 hours. Urine analysis:    Component Value Date/Time   APPEARANCEUR Cloudy (A) 03/08/2017 1419   GLUCOSEU Negative 03/08/2017 1419   BILIRUBINUR Negative 03/08/2017 1419   PROTEINUR Negative 03/08/2017 1419   NITRITE Negative 03/08/2017 1419   LEUKOCYTESUR Trace (A) 03/08/2017 1419    Radiological Exams on Admission: CT HEAD WO CONTRAST (5MM)  Result Date: 06/12/2021 CLINICAL DATA:  Dizziness. EXAM: CT HEAD WITHOUT CONTRAST TECHNIQUE: Contiguous axial images were obtained from the base of the skull through the vertex without intravenous contrast. COMPARISON:  None. FINDINGS: Brain: Multiple mass like lesions are present with surrounding edema. Hyperdense mass in the left frontal lobe measures 18 x 18 x 17 mm. A right occipital lesion measures 13 x 11 x 15 mm with surrounding vasogenic edema mass effect. A lesion in the left cerebellum just below the tentorium measures 17 x 13 x 9 mm with vasogenic edema and mass effect in the left hemisphere. There is some mass effect on the fourth ventricle without obstruction. A left frontal lobe lesion near the vertex measures up to 12 mm. There is some surrounding edema. Hyperdense sellar/suprasellar mass  measures 26 x 17 x 18 mm. This creates mass effect on the optic chiasm. Ventricles are of normal size. No significant extraaxial fluid collection is present. Vascular: No hyperdense vessel or unexpected calcification. Skull: Calvarium is intact. No focal lytic or blastic lesions are present. No significant extracranial soft tissue lesion is present. Sinuses/Orbits: The paranasal sinuses and mastoid air cells are clear. The globes and orbits are within normal limits. IMPRESSION: 1. Multiple mass like lesions with surrounding edema and mass effect in the brain, as described. The largest lesion is in the left frontal lobe measuring 18 x 18 x 17 mm. The findings are concerning for metastatic disease to the brain. MRI of the brain without and with contrast is recommended for further evaluation. 2. Hyperdense sellar/suprasellar mass measures 26 x 17 x 18 mm. This creates mass effect on the optic chiasm. This could represent a pituitary macroadenoma. Additional metastatic lesion also considered. Electronically Signed   By: San Morelle M.D.   On: 06/12/2021 12:09   CT CHEST ABDOMEN PELVIS W CONTRAST  Result Date: 06/12/2021 CLINICAL DATA:  Dizziness. Vomiting. Abdominal pain. Abnormal chest radiograph. EXAM: CT CHEST, ABDOMEN, AND PELVIS WITH CONTRAST TECHNIQUE: Multidetector CT imaging of the chest, abdomen and pelvis was performed following the standard protocol during bolus administration of intravenous contrast. CONTRAST:  40mL OMNIPAQUE IOHEXOL 350 MG/ML SOLN COMPARISON:  Chest radiograph from earlier today. 02/19/2017 CT abdomen/pelvis. FINDINGS: CT CHEST FINDINGS Cardiovascular: Normal heart size. No significant pericardial effusion/thickening. Mildly atherosclerotic nonaneurysmal thoracic aorta. Normal caliber pulmonary arteries. No central pulmonary emboli. Mediastinum/Nodes: Hypodense 1.0 cm right thyroid nodule. Unremarkable esophagus. Heterogeneously enhancing bilateral supraclavicular adenopathy  measuring 2.1 cm short axis diameter on the left (series 2/image 7) and 1.4 cm on the right (series 2/image 8). No right axillary adenopathy. Enlarged heterogeneous 2.7 cm left axillary node (series 2/image 14). Enlarged 1.1 cm left retropectoral node (series 2/image 13). Enlarged heterogeneous bilateral prevascular mediastinal nodes, largest 2.0 cm on the left (series 2/image 19). Enlarged 2.9 cm AP window heterogeneous node (series 2/image 21). Bulky confluent heterogeneous right paratracheal adenopathy up to 3.0 cm (series 2/image 19). Enlarged heterogeneous 2.9 cm subcarinal node (series 2/image 25). Enlarged heterogeneous 2.6 cm right hilar node (series 2/image 21). No pathologically enlarged left hilar  nodes. Lungs/Pleura: No pneumothorax. No pleural effusion. Obstructing central right upper lobe lung mass with abrupt cut off of the right upper lobe bronchus, poorly delineated given complete right upper lobe collapse, approximately 6.0 x 5.6 cm (series 2/image 14). Predominantly solid 2.5 x 2.1 cm peripheral right lower lobe pulmonary nodule (series 4/image 76). Several (at least 8) additional subcentimeter solid pulmonary nodules in both lungs, largest 0.6 cm in the posterior right middle lobe (series 4/image 77). Musculoskeletal: No aggressive appearing focal osseous lesions. Marked thoracic spondylosis. CT ABDOMEN PELVIS FINDINGS Hepatobiliary: Normal liver with no liver mass. Cholecystectomy. No biliary ductal dilatation. Pancreas: Normal, with no mass or duct dilation. Spleen: Normal size. No mass. Adrenals/Urinary Tract: Normal adrenals. Nonobstructing 5 mm lower left renal stone. Subcentimeter hypodense bilateral renal cortical lesions are too small to characterize and require no follow-up. No hydronephrosis. Normal bladder. Stomach/Bowel: Small hiatal hernia. Otherwise normal nondistended stomach. Normal caliber small bowel with no small bowel wall thickening. Normal appendix. Mild left colonic  diverticulosis with no large bowel wall thickening or significant pericolonic fat stranding. Vascular/Lymphatic: Atherosclerotic nonaneurysmal abdominal aorta. Patent portal, splenic, hepatic and renal veins. No pathologically enlarged lymph nodes in the abdomen or pelvis. Reproductive: Status post hysterectomy, with no abnormal findings at the vaginal cuff. No adnexal mass. Other: No pneumoperitoneum, ascites or focal fluid collection. Musculoskeletal: No aggressive appearing focal osseous lesions. Moderate lumbar spondylosis. IMPRESSION: 1. Suspected obstructing central right upper lobe lung mass with abrupt cut off of the right upper lobe bronchus, poorly delineated given complete right upper lobe collapse, estimated at 6.0 cm size, highly suspicious for primary bronchogenic carcinoma. 2. Bilateral supraclavicular, bilateral mediastinal, right hilar, left retropectoral and left axillary lymphadenopathy with heterogeneous enhancement, compatible with metastatic disease. 3. Predominantly solid 2.5 cm right lower lobe pulmonary nodule is suspicious for metastasis versus a second primary lung neoplasm. Several (at least 8) additional subcentimeter solid pulmonary nodules scattered in both lungs, suspect pulmonary metastases. 4. Multi disciplinary thoracic oncology consultation suggested. Consider PET-CT for further staging evaluation. 5. No evidence of metastatic disease in the abdomen or pelvis. 6. Small hiatal hernia. 7. Mild left colonic diverticulosis. 8. Nonobstructing left nephrolithiasis. 9. Aortic Atherosclerosis (ICD10-I70.0). Electronically Signed   By: Ilona Sorrel M.D.   On: 06/12/2021 13:00   DG Chest Portable 1 View  Result Date: 06/12/2021 CLINICAL DATA:  Infiltrate.  Dizziness.  Emesis. EXAM: PORTABLE CHEST 1 VIEW COMPARISON:  None. FINDINGS: Heart size is normal. Right upper lobe collapse noted. Opacity in the right upper hemithorax. Patchy opacity noted in the right lower lobe. The left lung is  clear. Axial skeleton unremarkable. IMPRESSION: 1. Right upper lobe collapse and patchy right lower lobe airspace disease. This raises concern for underlying obstruction. Recommend CT of the chest with contrast for further evaluation. 2. The left lung is clear. These results were called by telephone at the time of interpretation on 06/12/2021 at 11:51 am to provider Merlyn Lot , who verbally acknowledged these results. Electronically Signed   By: San Morelle M.D.   On: 06/12/2021 11:54      Assessment/Plan 70 yo F with hx of tobacco use and strong family hx of cancer presenting with 5 day history of dizziness found to have obstructing lung mass with metastasis to brain.  RUL collapse from obstructing central RUL mass concerning for primary bronchogenic carcinoma with lymphadenopathy Multiple brain metastasis with vasogenic edema -remains stable on room air and do not have altered mental status  -MRI with and without contrast  brain pending -Dr. Grayland Ormond with oncology has been notified ED physician will see in consultation in the morning.  Did not recommend consultation to neurosurgery at this time. -Continue IV Decadron -patient wants to remain Full code and do treatment with her metastatic disease  Hypokalemia - Repleted with oral and IV potassium in the ED -follow with repeat labs in the morning  Hyperglycemia - BG of 114 on admit. Will start a very sensitive sliding scale on IV steroids  Hypertension -continue antihypertensives after medication reconciliation   DVT prophylaxis:.SCD Code Status: Full Family Communication: Plan discussed with patient and brother at bedside.  All questions and concerns were addressed. disposition Plan: Home with observation Consults called: oncology Admission status: Observation  Level of care: Med-Surg  Status is: Observation  The patient remains OBS appropriate and will d/c before 2 midnights.  Dispo: The patient is from: Home               Anticipated d/c is to: Home              Patient currently is not medically stable to d/c.   Difficult to place patient No         Orene Desanctis DO [facility]   If 7PM-7AM, please contact night-coverage [email or web address]   06/12/2021, 1:23 PM

## 2021-06-13 ENCOUNTER — Ambulatory Visit: Payer: Medicare Other | Admitting: Radiation Oncology

## 2021-06-13 DIAGNOSIS — E876 Hypokalemia: Secondary | ICD-10-CM

## 2021-06-13 DIAGNOSIS — C7931 Secondary malignant neoplasm of brain: Secondary | ICD-10-CM | POA: Diagnosis not present

## 2021-06-13 DIAGNOSIS — C3411 Malignant neoplasm of upper lobe, right bronchus or lung: Secondary | ICD-10-CM

## 2021-06-13 DIAGNOSIS — G9389 Other specified disorders of brain: Secondary | ICD-10-CM | POA: Diagnosis not present

## 2021-06-13 DIAGNOSIS — R112 Nausea with vomiting, unspecified: Secondary | ICD-10-CM | POA: Diagnosis not present

## 2021-06-13 LAB — BASIC METABOLIC PANEL
Anion gap: 7 (ref 5–15)
BUN: 8 mg/dL (ref 8–23)
CO2: 27 mmol/L (ref 22–32)
Calcium: 10 mg/dL (ref 8.9–10.3)
Chloride: 110 mmol/L (ref 98–111)
Creatinine, Ser: 0.73 mg/dL (ref 0.44–1.00)
GFR, Estimated: >60 mL/min (ref >60)
Glucose, Bld: 126 mg/dL — ABNORMAL HIGH (ref 70–99)
Potassium: 4.9 mmol/L (ref 3.5–5.1)
Sodium: 144 mmol/L (ref 135–145)

## 2021-06-13 LAB — GLUCOSE, CAPILLARY: Glucose-Capillary: 145 mg/dL — ABNORMAL HIGH (ref 70–99)

## 2021-06-13 LAB — HIV ANTIBODY (ROUTINE TESTING W REFLEX): HIV Screen 4th Generation wRfx: NONREACTIVE

## 2021-06-13 MED ORDER — DEXAMETHASONE 4 MG PO TABS: 4.0000 mg | ORAL_TABLET | Freq: Two times a day (BID) | ORAL | 1 refills | Status: DC

## 2021-06-13 NOTE — Consult Note (Signed)
[place]  [phone] Fax:([phone]) [phone]  ID: Sandra Jordan OB: 19-Feb-1951  MR#: [id number]  AOZ#:[id number]  Patient Care Team: Marinda Elk, MD as PCP - General (Physician Assistant) Christene Lye, MD as Consulting Physician (General Surgery) Ellamae Sia, MD (Inactive) as Referring Physician (Internal Medicine)  CHIEF COMPLAINT: Right upper lobe lung mass with innumerable brain metastasis.  INTERVAL HISTORY: Patient is a 71 year old female who initially presented to the emergency room complaining of several days of progressive dizziness, nausea, and vomiting.  Subsequent MRI of the brain revealed innumerable brain metastasis.  CT of the chest, abdomen, pelvis revealed a right upper lobe lung mass with bilateral mediastinal and hilar adenopathy.  Patient symptoms improved with Decadron and she feels nearly back to her baseline.  Currently, her neurologic complaints have resolved.  She denies any recent fevers or illnesses.  She has a good appetite and denies weight loss.  She has no chest pain, shortness of breath, cough, or hemoptysis.  She denies any nausea, vomiting, constipation, or diarrhea.  She has no urinary complaints.  Patient offers no further specific complaints today.    REVIEW OF SYSTEMS:   Review of Systems  Constitutional:  Negative for fever, malaise/fatigue and weight loss.  Respiratory: Negative.  Negative for cough, hemoptysis and shortness of breath.   Cardiovascular: Negative.  Negative for chest pain and leg swelling.  Gastrointestinal: Negative.  Negative for nausea and vomiting.  Genitourinary: Negative.  Negative for dysuria.  Musculoskeletal: Negative.  Negative for back pain.  Skin: Negative.  Negative for rash.  Neurological: Negative.  Negative for dizziness, focal weakness, weakness and headaches.  Psychiatric/Behavioral: Negative.  The patient is not nervous/anxious.    As per HPI. Otherwise, a  complete review of systems is negative.  PAST MEDICAL HISTORY: Past Medical History:  Diagnosis Date   Allergy    Colon polyp    Hyperlipidemia    Hypertension     PAST SURGICAL HISTORY: Past Surgical History:  Procedure Laterality Date   ABDOMINAL HYSTERECTOMY  1984   BREAST BIOPSY Bilateral 1990   fibrocystic breast   BREAST BIOPSY Right August 2014   fibrocystic breast   CHOLECYSTECTOMY  2008   COL[facility]  2012, [facility]    FAMILY HISTORY: Family History  Problem Relation Age of Onset   Cancer Mother        lung   Cancer Sister        lung   Colon cancer Father 33   Prostate cancer Father     ADVANCED DIRECTIVES (Y/N):  @ADVDIR @  HEALTH MAINTENANCE: Social History   Tobacco Use   Smoking status: Former    Years: 15.00    Types: Cigarettes    Quit date: 10/08/2004    Years since quitting: 16.6   Smokeless tobacco: Never  Substance Use Topics   Alcohol use: No    Alcohol/week: 0.0 standard drinks   Drug use: No     Colonoscopy:  PAP:  Bone density:  Lipid panel:  No Known Allergies  Current Facility-Administered Medications  Medication Dose Route Frequency Provider Last Rate Last Admin   dexamethasone (DECADRON) injection 10 mg  10 mg Intravenous Q24H Tu, Ching T, DO   10 mg at 06/13/21 0916   ondansetron (ZOFRAN) injection 4 mg  4 mg Intravenous Q6H PRN Tu, Ching T, DO       Current Outpatient Medications  Medication Sig Dispense Refill   cetirizine (ZYRTEC) 10 MG  tablet Take 10 mg by mouth daily.     cyanocobalamin 1000 MCG tablet Take by mouth.     dexamethasone (DECADRON) 4 MG tablet Take 1 tablet (4 mg total) by mouth 2 (two) times daily with a meal. 60 tablet 1   fluticasone (FLONASE) 50 MCG/ACT nasal spray Place into both nostrils.     hydrALAZINE (APRESOLINE) 25 MG tablet Take 25 mg by mouth 2 (two) times daily.     hydrochlorothiazide (HYDRODIURIL) 25 MG tablet Take 25 mg by mouth daily.     ibuprofen (ADVIL) 800 MG tablet Take  800 mg by mouth every 6 (six) hours as needed.     lovastatin (MEVACOR) 20 MG tablet Take 20 mg by mouth at bedtime.     lovastatin (MEVACOR) 40 MG tablet SMARTSIG:1 Tablet(s) By Mouth Every Evening     meloxicam (MOBIC) 7.5 MG tablet Take 7.5 mg by mouth daily.     potassium chloride (KLOR-CON) 10 MEQ tablet Take 10 mEq by mouth daily.      OBJECTIVE: Vitals:   06/13/21 0734 06/13/21 1156  BP: 121/84 (!) 128/100  Pulse: 97 (!) 101  Resp: 18 18  Temp: 98.2 F (36.8 C) 98.7 F (37.1 C)  SpO2: 100%      Body mass index is 24.53 kg/m.    ECOG FS:1 - Symptomatic but completely ambulatory  General: Well-developed, well-nourished, no acute distress. Eyes: Pink conjunctiva, anicteric sclera. HEENT: Normocephalic, moist mucous membranes. Lungs: No audible wheezing or coughing. Heart: Regular rate and rhythm. Abdomen: Soft, nontender, no obvious distention. Musculoskeletal: No edema, cyanosis, or clubbing. Neuro: Alert, answering all questions appropriately. Cranial nerves grossly intact. Skin: No rashes or petechiae noted. Psych: Normal affect. Lymphatics: No cervical, calvicular, axillary or inguinal LAD.   LAB RESULTS:  Lab Results  Component Value Date   NA 144 06/13/2021   K 4.9 06/13/2021   CL 110 06/13/2021   CO2 27 06/13/2021   GLUCOSE 126 (H) 06/13/2021   BUN 8 06/13/2021   CREATININE 0.73 06/13/2021   CALCIUM 10.0 06/13/2021   PROT 7.7 06/12/2021   ALBUMIN 4.0 06/12/2021   AST 36 06/12/2021   ALT 41 06/12/2021   ALKPHOS 100 06/12/2021   BILITOT 0.9 06/12/2021   GFRNONAA >60 06/13/2021   GFRAA >60 01/26/2013    Lab Results  Component Value Date   WBC 8.1 06/12/2021   NEUTROABS 2.9 01/18/2014   HGB 14.6 06/12/2021   HCT 43.4 06/12/2021   MCV 84.3 06/12/2021   PLT 352 06/12/2021     STUDIES: CT HEAD WO CONTRAST (5MM)  Result Date: 06/12/2021 CLINICAL DATA:  Dizziness. EXAM: CT HEAD WITHOUT CONTRAST TECHNIQUE: Contiguous axial images were obtained from  the base of the skull through the vertex without intravenous contrast. COMPARISON:  None. FINDINGS: Brain: Multiple mass like lesions are present with surrounding edema. Hyperdense mass in the left frontal lobe measures 18 x 18 x 17 mm. A right occipital lesion measures 13 x 11 x 15 mm with surrounding vasogenic edema mass effect. A lesion in the left cerebellum just below the tentorium measures 17 x 13 x 9 mm with vasogenic edema and mass effect in the left hemisphere. There is some mass effect on the fourth ventricle without obstruction. A left frontal lobe lesion near the vertex measures up to 12 mm. There is some surrounding edema. Hyperdense sellar/suprasellar mass measures 26 x 17 x 18 mm. This creates mass effect on the optic chiasm. Ventricles are of normal size. No significant  extraaxial fluid collection is present. Vascular: No hyperdense vessel or unexpected calcification. Skull: Calvarium is intact. No focal lytic or blastic lesions are present. No significant extracranial soft tissue lesion is present. Sinuses/Orbits: The paranasal sinuses and mastoid air cells are clear. The globes and orbits are within normal limits. IMPRESSION: 1. Multiple mass like lesions with surrounding edema and mass effect in the brain, as described. The largest lesion is in the left frontal lobe measuring 18 x 18 x 17 mm. The findings are concerning for metastatic disease to the brain. MRI of the brain without and with contrast is recommended for further evaluation. 2. Hyperdense sellar/suprasellar mass measures 26 x 17 x 18 mm. This creates mass effect on the optic chiasm. This could represent a pituitary macroadenoma. Additional metastatic lesion also considered. Electronically Signed   By: San Morelle M.D.   On: 06/12/2021 12:09   MR Brain W and Wo Contrast  Result Date: 06/12/2021 CLINICAL DATA:  Brain mass or lesion. EXAM: MRI HEAD WITHOUT AND WITH CONTRAST TECHNIQUE: Multiplanar, multiecho pulse sequences of  the brain and surrounding structures were obtained without and with intravenous contrast. CONTRAST:  59mL MAGNEVIST GADOPENTETATE DIMEGLUMINE 469.01 MG/ML IV SOLN COMPARISON:  Head CT performed earlier today 06/12/2021. FINDINGS: Brain: Cerebral volume is normal. There are numerable enhancing parenchymal metastases within the supratentorial brain, brainstem and bilateral cerebellar hemispheres compatible with intracranial metastatic disease. The 2 largest supratentorial lesions measure 2.0 x 1.9 x 2.0 cm within the mid left frontal lobe/left frontal operculum (series 19, image 95) and 2.5 x 2.0 x 2.7 cm within the right occipital lobe (for instance as seen on series 19, image 70). Additionally, there is an enhancing mass centered in the region of the anteroinferior third ventricle, hypothalamus and suprasellar region measuring 1.5 x 1.9 x 2.2 cm. This mass is located posterior to the optic chiasm, anteriorly displacing and exerting mass effect upon the optic chiasm. This lesion also encroaches upon the anteroinferior aspect of the third ventricle. The largest infratentorial lesion is located within the left cerebellum and measures 4.0 x 3.4 x 2.9 cm. There is mild edema surrounding many of these lesions. Additionally, many of these lesions demonstrate internal SWI signal loss which may reflect hemorrhage and/or mineralization. Local mass effect associated with the largest supratentorial lesions. No midline shift. Significant mass effect associated with the dominant left cerebellar lesion with rightward deviation and partial effacement of the fourth ventricle. No evidence of hydrocephalus at this time. Background mild multifocal T2/FLAIR hyperintensity within the cerebral white matter, nonspecific but compatible with chronic small vessel ischemic disease. There is no acute infarct. No extra-axial fluid collection. Vascular: Maintained flow voids within the proximal large arterial vessels. Skull and upper cervical  spine: No focal suspicious marrow lesion. Incompletely assessed cervical spondylosis. Sinuses/Orbits: Visualized orbits show no acute finding. No significant paranasal sinus disease. Other: Nonspecific 10 mm lesion associated with the left external ear. IMPRESSION: Innumerable intracranial parenchymal metastases within the supratentorial and infratentorial brain, as described. Measurements for the largest supratentorial and infratentorial lesions have been provided. Additional 2.2 x 1.9 cm enhancing mass centered in the region of the anteroinferior third ventricle, hypothalamus and suprasellar region which is also favored to reflect a metastasis. This mass is located posterior to the optic chiasm, anteriorly displacing and exerting mass effect upon the optic chiasm. This lesion also encroaches upon the anteroinferior aspect of the third ventricle. There is mild edema surrounding many of the lesions. Additionally, there is SWI signal loss associated with many  of the lesions which may reflect mineralization and/or hemorrhage. Mass effect associated with the dominant left cerebellar lesion with rightward deviation and partial effacement of the fourth ventricle. No evidence of hydrocephalus at this time. Background mild chronic small vessel ischemic changes within cerebral white matter. Nonspecific 10 mm round lesion associated with the left external ear. Direct visualization recommended. Electronically Signed   By: Kellie Simmering D.O.   On: 06/12/2021 14:35   CT CHEST ABDOMEN PELVIS W CONTRAST  Result Date: 06/12/2021 CLINICAL DATA:  Dizziness. Vomiting. Abdominal pain. Abnormal chest radiograph. EXAM: CT CHEST, ABDOMEN, AND PELVIS WITH CONTRAST TECHNIQUE: Multidetector CT imaging of the chest, abdomen and pelvis was performed following the standard protocol during bolus administration of intravenous contrast. CONTRAST:  8mL OMNIPAQUE IOHEXOL 350 MG/ML SOLN COMPARISON:  Chest radiograph from earlier today. 02/19/2017  CT abdomen/pelvis. FINDINGS: CT CHEST FINDINGS Cardiovascular: Normal heart size. No significant pericardial effusion/thickening. Mildly atherosclerotic nonaneurysmal thoracic aorta. Normal caliber pulmonary arteries. No central pulmonary emboli. Mediastinum/Nodes: Hypodense 1.0 cm right thyroid nodule. Unremarkable esophagus. Heterogeneously enhancing bilateral supraclavicular adenopathy measuring 2.1 cm short axis diameter on the left (series 2/image 7) and 1.4 cm on the right (series 2/image 8). No right axillary adenopathy. Enlarged heterogeneous 2.7 cm left axillary node (series 2/image 14). Enlarged 1.1 cm left retropectoral node (series 2/image 13). Enlarged heterogeneous bilateral prevascular mediastinal nodes, largest 2.0 cm on the left (series 2/image 19). Enlarged 2.9 cm AP window heterogeneous node (series 2/image 21). Bulky confluent heterogeneous right paratracheal adenopathy up to 3.0 cm (series 2/image 19). Enlarged heterogeneous 2.9 cm subcarinal node (series 2/image 25). Enlarged heterogeneous 2.6 cm right hilar node (series 2/image 21). No pathologically enlarged left hilar nodes. Lungs/Pleura: No pneumothorax. No pleural effusion. Obstructing central right upper lobe lung mass with abrupt cut off of the right upper lobe bronchus, poorly delineated given complete right upper lobe collapse, approximately 6.0 x 5.6 cm (series 2/image 14). Predominantly solid 2.5 x 2.1 cm peripheral right lower lobe pulmonary nodule (series 4/image 76). Several (at least 8) additional subcentimeter solid pulmonary nodules in both lungs, largest 0.6 cm in the posterior right middle lobe (series 4/image 77). Musculoskeletal: No aggressive appearing focal osseous lesions. Marked thoracic spondylosis. CT ABDOMEN PELVIS FINDINGS Hepatobiliary: Normal liver with no liver mass. Cholecystectomy. No biliary ductal dilatation. Pancreas: Normal, with no mass or duct dilation. Spleen: Normal size. No mass. Adrenals/Urinary  Tract: Normal adrenals. Nonobstructing 5 mm lower left renal stone. Subcentimeter hypodense bilateral renal cortical lesions are too small to characterize and require no follow-up. No hydronephrosis. Normal bladder. Stomach/Bowel: Small hiatal hernia. Otherwise normal nondistended stomach. Normal caliber small bowel with no small bowel wall thickening. Normal appendix. Mild left colonic diverticulosis with no large bowel wall thickening or significant pericolonic fat stranding. Vascular/Lymphatic: Atherosclerotic nonaneurysmal abdominal aorta. Patent portal, splenic, hepatic and renal veins. No pathologically enlarged lymph nodes in the abdomen or pelvis. Reproductive: Status post hysterectomy, with no abnormal findings at the vaginal cuff. No adnexal mass. Other: No pneumoperitoneum, ascites or focal fluid collection. Musculoskeletal: No aggressive appearing focal osseous lesions. Moderate lumbar spondylosis. IMPRESSION: 1. Suspected obstructing central right upper lobe lung mass with abrupt cut off of the right upper lobe bronchus, poorly delineated given complete right upper lobe collapse, estimated at 6.0 cm size, highly suspicious for primary bronchogenic carcinoma. 2. Bilateral supraclavicular, bilateral mediastinal, right hilar, left retropectoral and left axillary lymphadenopathy with heterogeneous enhancement, compatible with metastatic disease. 3. Predominantly solid 2.5 cm right lower lobe pulmonary nodule is suspicious  for metastasis versus a second primary lung neoplasm. Several (at least 8) additional subcentimeter solid pulmonary nodules scattered in both lungs, suspect pulmonary metastases. 4. Multi disciplinary thoracic oncology consultation suggested. Consider PET-CT for further staging evaluation. 5. No evidence of metastatic disease in the abdomen or pelvis. 6. Small hiatal hernia. 7. Mild left colonic diverticulosis. 8. Nonobstructing left nephrolithiasis. 9. Aortic Atherosclerosis  (ICD10-I70.0). Electronically Signed   By: Ilona Sorrel M.D.   On: 06/12/2021 13:00   DG Chest Portable 1 View  Result Date: 06/12/2021 CLINICAL DATA:  Infiltrate.  Dizziness.  Emesis. EXAM: PORTABLE CHEST 1 VIEW COMPARISON:  None. FINDINGS: Heart size is normal. Right upper lobe collapse noted. Opacity in the right upper hemithorax. Patchy opacity noted in the right lower lobe. The left lung is clear. Axial skeleton unremarkable. IMPRESSION: 1. Right upper lobe collapse and patchy right lower lobe airspace disease. This raises concern for underlying obstruction. Recommend CT of the chest with contrast for further evaluation. 2. The left lung is clear. These results were called by telephone at the time of interpretation on 06/12/2021 at 11:51 am to provider Merlyn Lot , who verbally acknowledged these results. Electronically Signed   By: San Morelle M.D.   On: 06/12/2021 11:54    ASSESSMENT: Right upper lobe lung mass with innumerable brain metastasis.  PLAN:    Right upper lobe lung mass with innumerable brain metastasis: Likely stage IV lung cancer, possibly small cell.  Appreciate radiation oncology input and patient will initiate palliative XRT to her brain tomorrow.  Prior to initiating chemotherapy, patient will require biopsy of supraclavicular lymph node, PET scan, and port placement.  Can possibly get biopsy and port placement with same procedure.  Okay to discharge from an oncology standpoint and follow-up in the cancer center tomorrow for radiation and then in 1 to 2 weeks after all of her procedures to discuss her final diagnosis and treatment planning.   Edema: Continue Decadron twice daily.    Appreciate consult, call with questions.   Lloyd Huger, MD   06/13/2021 10:57 PM

## 2021-06-13 NOTE — Discharge Summary (Signed)
Physician Discharge Summary  Sandra Jordan OIZ:[id number] DOB: [birth year] DOA: 06/12/2021  PCP: Marinda Elk, MD  Admit date: 06/12/2021 Discharge date: 06/13/2021  Admitted From: Home  Disposition:  Home with [place]    Recommendations for Outpatient Follow-up:  Follow up with Dr. Carrie Mew PCP in 1 week Follow up with Oncology Dr. Grayland Ormond and [place]: PT/OT due to balance issues  Equipment/Devices: Rolling Sharp, 3in1  Discharge Condition: Fair  CODE STATUS: FULL Diet recommendation: Regular  Brief/Interim Summary: Sandra Jordan is a 70 y.o. F with HTN who presented with several days of progressive dizziness, nausea and vomiting.   Went to see PCP, told her K was low, sent home with K.  Couldn't keep pills down, symptoms seemed to be worsening, came to the ER.   In the ER, CT head showed multiple masslike lesions with vasogenic edema.  CXR showed collapsed RUL.  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  CT C/A/P shows central RUL lung mass with local lung mets.   Oncology and Radiation oncology evaluated the patient and put treatment plan in place.  She felt better with dexamethasone, was able to ambulate well and was discharged home with dexamethasone and follow up tomorrow at the [facility] for lymph node biopsy, PET scan and Port placement.         Hypertension   Hypokalemia Supplemented and resolved   Pre-diabetes Glucose normal               Discharge Instructions  Discharge Instructions     Discharge instructions   Complete by: As directed    From Dr. Loleta Books: You were admitted for dizziness, found to have a lung mass and likely metastasis (spreading)  to the brain.  For swelling in the brain, take dexamethasone 4 mg twice daily (once in morning and once at night)  Resume your other home medicines  Go see Drs. Finnegan and Chrystal at the [facility] tomorrow as instructed   Increase activity slowly   Complete by: As directed       Allergies as of 06/13/2021   No Known Allergies      Medication List     TAKE these medications    cetirizine 10 MG tablet Commonly known as: ZYRTEC Take 10 mg by mouth daily.   cyanocobalamin 1000 MCG tablet Take by mouth.   dexamethasone 4 MG tablet Commonly known as: DECADRON Take 1 tablet (4 mg total) by mouth 2 (two) times daily with a meal.   fluticasone 50 MCG/ACT nasal spray Commonly known as: FLONASE Place into both nostrils.   hydrALAZINE 25 MG tablet Commonly known as: APRESOLINE Take 25 mg by mouth 2 (two) times daily.   hydrochlorothiazide 25 MG tablet Commonly known as: HYDRODIURIL Take 25 mg by mouth daily.   ibuprofen 800 MG tablet Commonly known as: ADVIL Take 800 mg by mouth every 6 (six) hours as needed.   lovastatin 40 MG tablet Commonly known as: MEVACOR SMARTSIG:1 Tablet(s) By Mouth Every Evening   lovastatin 20 MG tablet Commonly known as: MEVACOR Take 20 mg by mouth at bedtime.   meloxicam 7.5 MG tablet Commonly known as: MOBIC Take  7.5 mg by mouth daily.   potassium chloride 10 MEQ tablet Commonly known as: KLOR-CON Take 10 mEq by mouth daily.               Durable Medical Equipment  (From admission, onward)           Start     Ordered   06/13/21 1717  DME 3-in-1  Once        06/13/21 1716   06/13/21 1717  DME [place]  Once       Question Answer Comment  [place]: With 5 Inch Wheels   Patient needs a [employer] to treat with the following condition Cancer ([place])      06/13/21 1716            Follow-up Information     Lloyd Huger, MD Follow up.   Specialty: Oncology Contact information: [place] [address] [phone]         Noreene Filbert, MD .   Specialty: Radiation Oncology Contact information: [address] [place]   [place] [address] [phone]                No Known Allergies  Consultations: Oncology   Procedures/Studies: CT HEAD WO CONTRAST (5MM)  Result Date: 06/12/2021 CLINICAL DATA:  Dizziness. EXAM: CT HEAD WITHOUT CONTRAST TECHNIQUE: Contiguous axial images were obtained from the base of the skull through the vertex without intravenous contrast. COMPARISON:  None. FINDINGS: Brain: Multiple mass like lesions are present with surrounding edema. Hyperdense mass in the left frontal lobe measures 18 x 18 x 17 mm. A right occipital lesion measures 13 x 11 x 15 mm with surrounding vasogenic edema mass effect. A lesion in the left cerebellum just below the tentorium measures 17 x 13 x 9 mm with vasogenic edema and mass effect in the left hemisphere. There is some mass effect on the fourth ventricle without obstruction. A left frontal lobe lesion near the vertex measures up to 12 mm. There is some surrounding edema. Hyperdense sellar/suprasellar mass measures 26 x 17 x 18 mm. This creates mass effect on the optic chiasm. Ventricles are of normal size. No significant extraaxial fluid collection is present. Vascular: No hyperdense vessel or unexpected calcification. Skull: Calvarium is intact. No focal lytic or blastic lesions are present. No significant extracranial soft tissue lesion is present. Sinuses/Orbits: The paranasal sinuses and mastoid air cells are clear. The globes and orbits are within normal limits. IMPRESSION: 1. Multiple mass like lesions with surrounding edema and mass effect in the brain, as described. The largest lesion is in the left frontal lobe measuring 18 x 18 x 17 mm. The findings are concerning for metastatic disease to the brain. MRI of the brain without and with contrast is recommended for further evaluation. 2.  Hyperdense sellar/suprasellar mass measures 26 x 17 x 18 mm. This creates mass effect on the optic chiasm. This could represent a pituitary macroadenoma. Additional metastatic lesion also considered. Electronically Signed   By: San Morelle M.D.   On: 06/12/2021 12:09   MR Brain W and Wo Contrast  Result Date: 06/12/2021 CLINICAL DATA:  Brain mass or lesion. EXAM: MRI HEAD WITHOUT AND WITH CONTRAST TECHNIQUE: Multiplanar, multiecho pulse sequences of the brain and surrounding structures were obtained without and with intravenous contrast. CONTRAST:  38mL MAGNEVIST GADOPENTETATE DIMEGLUMINE 469.01 MG/ML IV SOLN COMPARISON:  Head CT performed earlier today 06/12/2021. FINDINGS: Brain: Cerebral volume is normal. There are  numerable enhancing parenchymal metastases within the supratentorial brain, brainstem and bilateral cerebellar hemispheres compatible with intracranial metastatic disease. The 2 largest supratentorial lesions measure 2.0 x 1.9 x 2.0 cm within the mid left frontal lobe/left frontal operculum (series 19, image 95) and 2.5 x 2.0 x 2.7 cm within the right occipital lobe (for instance as seen on series 19, image 70). Additionally, there is an enhancing mass centered in the region of the anteroinferior third ventricle, hypothalamus and suprasellar region measuring 1.5 x 1.9 x 2.2 cm. This mass is located posterior to the optic chiasm, anteriorly displacing and exerting mass effect upon the optic chiasm. This lesion also encroaches upon the anteroinferior aspect of the third ventricle. The largest infratentorial lesion is located within the left cerebellum and measures 4.0 x 3.4 x 2.9 cm. There is mild edema surrounding many of these lesions. Additionally, many of these lesions demonstrate internal SWI signal loss which may reflect hemorrhage and/or mineralization. Local mass effect associated with the largest supratentorial lesions. No midline shift. Significant mass effect associated with the  dominant left cerebellar lesion with rightward deviation and partial effacement of the fourth ventricle. No evidence of hydrocephalus at this time. Background mild multifocal T2/FLAIR hyperintensity within the cerebral white matter, nonspecific but compatible with chronic small vessel ischemic disease. There is no acute infarct. No extra-axial fluid collection. Vascular: Maintained flow voids within the proximal large arterial vessels. Skull and upper cervical spine: No focal suspicious marrow lesion. Incompletely assessed cervical spondylosis. Sinuses/Orbits: Visualized orbits show no acute finding. No significant paranasal sinus disease. Other: Nonspecific 10 mm lesion associated with the left external ear. IMPRESSION: Innumerable intracranial parenchymal metastases within the supratentorial and infratentorial brain, as described. Measurements for the largest supratentorial and infratentorial lesions have been provided. Additional 2.2 x 1.9 cm enhancing mass centered in the region of the anteroinferior third ventricle, hypothalamus and suprasellar region which is also favored to reflect a metastasis. This mass is located posterior to the optic chiasm, anteriorly displacing and exerting mass effect upon the optic chiasm. This lesion also encroaches upon the anteroinferior aspect of the third ventricle. There is mild edema surrounding many of the lesions. Additionally, there is SWI signal loss associated with many of the lesions which may reflect mineralization and/or hemorrhage. Mass effect associated with the dominant left cerebellar lesion with rightward deviation and partial effacement of the fourth ventricle. No evidence of hydrocephalus at this time. Background mild chronic small vessel ischemic changes within cerebral white matter. Nonspecific 10 mm round lesion associated with the left external ear. Direct visualization recommended. Electronically Signed   By: Kellie Simmering D.O.   On: 06/12/2021 14:35   CT  CHEST ABDOMEN PELVIS W CONTRAST  Result Date: 06/12/2021 CLINICAL DATA:  Dizziness. Vomiting. Abdominal pain. Abnormal chest radiograph. EXAM: CT CHEST, ABDOMEN, AND PELVIS WITH CONTRAST TECHNIQUE: Multidetector CT imaging of the chest, abdomen and pelvis was performed following the standard protocol during bolus administration of intravenous contrast. CONTRAST:  49mL OMNIPAQUE IOHEXOL 350 MG/ML SOLN COMPARISON:  Chest radiograph from earlier today. 02/19/2017 CT abdomen/pelvis. FINDINGS: CT CHEST FINDINGS Cardiovascular: Normal heart size. No significant pericardial effusion/thickening. Mildly atherosclerotic nonaneurysmal thoracic aorta. Normal caliber pulmonary arteries. No central pulmonary emboli. Mediastinum/Nodes: Hypodense 1.0 cm right thyroid nodule. Unremarkable esophagus. Heterogeneously enhancing bilateral supraclavicular adenopathy measuring 2.1 cm short axis diameter on the left (series 2/image 7) and 1.4 cm on the right (series 2/image 8). No right axillary adenopathy. Enlarged heterogeneous 2.7 cm left axillary node (series 2/image 14). Enlarged 1.1  cm left retropectoral node (series 2/image 13). Enlarged heterogeneous bilateral prevascular mediastinal nodes, largest 2.0 cm on the left (series 2/image 19). Enlarged 2.9 cm AP window heterogeneous node (series 2/image 21). Bulky confluent heterogeneous right paratracheal adenopathy up to 3.0 cm (series 2/image 19). Enlarged heterogeneous 2.9 cm subcarinal node (series 2/image 25). Enlarged heterogeneous 2.6 cm right hilar node (series 2/image 21). No pathologically enlarged left hilar nodes. Lungs/Pleura: No pneumothorax. No pleural effusion. Obstructing central right upper lobe lung mass with abrupt cut off of the right upper lobe bronchus, poorly delineated given complete right upper lobe collapse, approximately 6.0 x 5.6 cm (series 2/image 14). Predominantly solid 2.5 x 2.1 cm peripheral right lower lobe pulmonary nodule (series 4/image 76).  Several (at least 8) additional subcentimeter solid pulmonary nodules in both lungs, largest 0.6 cm in the posterior right middle lobe (series 4/image 77). Musculoskeletal: No aggressive appearing focal osseous lesions. Marked thoracic spondylosis. CT ABDOMEN PELVIS FINDINGS Hepatobiliary: Normal liver with no liver mass. Cholecystectomy. No biliary ductal dilatation. Pancreas: Normal, with no mass or duct dilation. Spleen: Normal size. No mass. Adrenals/Urinary Tract: Normal adrenals. Nonobstructing 5 mm lower left renal stone. Subcentimeter hypodense bilateral renal cortical lesions are too small to characterize and require no follow-up. No hydronephrosis. Normal bladder. Stomach/Bowel: Small hiatal hernia. Otherwise normal nondistended stomach. Normal caliber small bowel with no small bowel wall thickening. Normal appendix. Mild left colonic diverticulosis with no large bowel wall thickening or significant pericolonic fat stranding. Vascular/Lymphatic: Atherosclerotic nonaneurysmal abdominal aorta. Patent portal, splenic, hepatic and renal veins. No pathologically enlarged lymph nodes in the abdomen or pelvis. Reproductive: Status post hysterectomy, with no abnormal findings at the vaginal cuff. No adnexal mass. Other: No pneumoperitoneum, ascites or focal fluid collection. Musculoskeletal: No aggressive appearing focal osseous lesions. Moderate lumbar spondylosis. IMPRESSION: 1. Suspected obstructing central right upper lobe lung mass with abrupt cut off of the right upper lobe bronchus, poorly delineated given complete right upper lobe collapse, estimated at 6.0 cm size, highly suspicious for primary bronchogenic carcinoma. 2. Bilateral supraclavicular, bilateral mediastinal, right hilar, left retropectoral and left axillary lymphadenopathy with heterogeneous enhancement, compatible with metastatic disease. 3. Predominantly solid 2.5 cm right lower lobe pulmonary nodule is suspicious for metastasis versus a  second primary lung neoplasm. Several (at least 8) additional subcentimeter solid pulmonary nodules scattered in both lungs, suspect pulmonary metastases. 4. Multi disciplinary thoracic oncology consultation suggested. Consider PET-CT for further staging evaluation. 5. No evidence of metastatic disease in the abdomen or pelvis. 6. Small hiatal hernia. 7. Mild left colonic diverticulosis. 8. Nonobstructing left nephrolithiasis. 9. Aortic Atherosclerosis (ICD10-I70.0). Electronically Signed   By: Ilona Sorrel M.D.   On: 06/12/2021 13:00   DG Chest Portable 1 View  Result Date: 06/12/2021 CLINICAL DATA:  Infiltrate.  Dizziness.  Emesis. EXAM: PORTABLE CHEST 1 VIEW COMPARISON:  None. FINDINGS: Heart size is normal. Right upper lobe collapse noted. Opacity in the right upper hemithorax. Patchy opacity noted in the right lower lobe. The left lung is clear. Axial skeleton unremarkable. IMPRESSION: 1. Right upper lobe collapse and patchy right lower lobe airspace disease. This raises concern for underlying obstruction. Recommend CT of the chest with contrast for further evaluation. 2. The left lung is clear. These results were called by telephone at the time of interpretation on 06/12/2021 at 11:51 am to provider Merlyn Lot , who verbally acknowledged these results. Electronically Signed   By: San Morelle M.D.   On: 06/12/2021 11:54      Subjective: Feeling  well.  Dizziness improved.  No vomiting, no confusion, no seizures.  Discharge Exam: Vitals:   06/13/21 0734 06/13/21 1156  BP: 121/84 (!) 128/100  Pulse: 97 (!) 101  Resp: 18 18  Temp: 98.2 F (36.8 C) 98.7 F (37.1 C)  SpO2: 100%    Vitals:   06/13/21 0016 06/13/21 0559 06/13/21 0734 06/13/21 1156  BP: 125/85 135/89 121/84 (!) 128/100  Pulse: (!) 104 95 97 (!) 101  Resp: 16 16 18 18   Temp: 98.7 F (37.1 C) 97.6 F (36.4 C) 98.2 F (36.8 C) 98.7 F (37.1 C)  TempSrc:      SpO2: 98% 99% 100%   Weight:      Height:         General: Pt is alert, awake, not in acute distress Cardiovascular: RRR, nl S1-S2, no murmurs appreciated.   No LE edema.   Respiratory: Normal respiratory rate and rhythm.  CTAB without rales or wheezes. Abdominal: Abdomen soft and non-tender.  No distension or HSM.   Neuro/Psych: Strength symmetric in upper and lower extremities.  Judgment and insight appear normal.  Impairment of smooth pursuits.  Some ataxia to FTN on the left.   The results of significant diagnostics from this hospitalization (including imaging, microbiology, ancillary and laboratory) are listed below for reference.     Microbiology: Recent Results (from the past 240 hour(s))  Resp Panel by RT-PCR (Flu A&B, Covid) Nasopharyngeal Swab     Status: None   Collection Time: 06/12/21 11:57 AM   Specimen: Nasopharyngeal Swab; Nasopharyngeal(NP) swabs in vial transport medium  Result Value Ref Range Status   SARS Coronavirus 2 by RT PCR NEGATIVE NEGATIVE Final    Comment: (NOTE) SARS-CoV-2 target nucleic acids are NOT DETECTED.  The SARS-CoV-2 RNA is generally detectable in upper respiratory specimens during the acute phase of infection. The lowest concentration of SARS-CoV-2 viral copies this assay can detect is 138 copies/mL. A negative result does not preclude SARS-Cov-2 infection and should not be used as the sole basis for treatment or other patient management decisions. A negative result may occur with  improper specimen collection/handling, submission of specimen other than nasopharyngeal swab, presence of viral mutation(s) within the areas targeted by this assay, and inadequate number of viral copies(<138 copies/mL). A negative result must be combined with clinical observations, patient history, and epidemiological information. The expected result is Negative.  Fact Sheet for Patients:  EntrepreneurPulse.com.au  Fact Sheet for Healthcare Providers:   IncredibleEmployment.be  This test is no t yet approved or cleared by the [place] FDA and  has been authorized for detection and/or diagnosis of SARS-CoV-2 by FDA under an Emergency Use Authorization (EUA). This EUA will remain  in effect (meaning this test can be used) for the duration of the COVID-19 declaration under Section 564(b)(1) of the Act, 21 U.S.C.section 360bbb-3(b)(1), unless the authorization is terminated  or revoked sooner.       Influenza A by PCR NEGATIVE NEGATIVE Final   Influenza B by PCR NEGATIVE NEGATIVE Final    Comment: (NOTE) The Xpert Xpress SARS-CoV-2/FLU/RSV plus assay is intended as an aid in the diagnosis of influenza from Nasopharyngeal swab specimens and should not be used as a sole basis for treatment. Nasal washings and aspirates are unacceptable for Xpert Xpress SARS-CoV-2/FLU/RSV testing.  Fact Sheet for Patients: EntrepreneurPulse.com.au  Fact Sheet for Healthcare Providers: IncredibleEmployment.be  This test is not yet approved or cleared by the [place] FDA and has been authorized for detection and/or diagnosis  of SARS-CoV-2 by FDA under an Emergency Use Authorization (EUA). This EUA will remain in effect (meaning this test can be used) for the duration of the COVID-19 declaration under Section 564(b)(1) of the Act, 21 U.S.C. section 360bbb-3(b)(1), unless the authorization is terminated or revoked.  Performed at [facility], [address] [place], [place] [address]      Labs: BNP (last 3 results) No results for input(s): BNP in the last 8760 hours. Basic Metabolic Panel: Recent Labs  Lab 06/12/21 1056 06/13/21 0446  NA 138 144  K 2.7* 4.9  CL 100 110  CO2 27 27  GLUCOSE 114* 126*  BUN 8 8  CREATININE 0.80 0.73  CALCIUM 9.6 10.0  MG 2.1  --    Liver Function Tests: Recent Labs  Lab 06/12/21 1056  AST 36  ALT 41  ALKPHOS 100  BILITOT 0.9   PROT 7.7  ALBUMIN 4.0   Recent Labs  Lab 06/12/21 1056  LIPASE 45   No results for input(s): AMMONIA in the last 168 hours. CBC: Recent Labs  Lab 06/12/21 1056  WBC 8.1  HGB 14.6  HCT 43.4  MCV 84.3  PLT 352   Cardiac Enzymes: No results for input(s): CKTOTAL, CKMB, CKMBINDEX, TROPONINI in the last 168 hours. BNP: Invalid input(s): POCBNP CBG: Recent Labs  Lab 06/12/21 1543 06/13/21 0735  GLUCAP 157* 145*   D-Dimer No results for input(s): DDIMER in the last 72 hours. Hgb A1c Recent Labs    06/12/21 1527  HGBA1C 5.9*   Lipid Profile No results for input(s): CHOL, HDL, LDLCALC, TRIG, CHOLHDL, LDLDIRECT in the last 72 hours. Thyroid function studies No results for input(s): TSH, T4TOTAL, T3FREE, THYROIDAB in the last 72 hours.  Invalid input(s): FREET3 Anemia work up No results for input(s): VITAMINB12, FOLATE, FERRITIN, TIBC, IRON, RETICCTPCT in the last 72 hours. Urinalysis    Component Value Date/Time   APPEARANCEUR Cloudy (A) 03/08/2017 1419   GLUCOSEU Negative 03/08/2017 1419   BILIRUBINUR Negative 03/08/2017 1419   PROTEINUR Negative 03/08/2017 1419   NITRITE Negative 03/08/2017 1419   LEUKOCYTESUR Trace (A) 03/08/2017 1419   Sepsis Labs Invalid input(s): PROCALCITONIN,  WBC,  LACTICIDVEN Microbiology Recent Results (from the past 240 hour(s))  Resp Panel by RT-PCR (Flu A&B, Covid) Nasopharyngeal Swab     Status: None   Collection Time: 06/12/21 11:57 AM   Specimen: Nasopharyngeal Swab; Nasopharyngeal(NP) swabs in vial transport medium  Result Value Ref Range Status   SARS Coronavirus 2 by RT PCR NEGATIVE NEGATIVE Final    Comment: (NOTE) SARS-CoV-2 target nucleic acids are NOT DETECTED.  The SARS-CoV-2 RNA is generally detectable in upper respiratory specimens during the acute phase of infection. The lowest concentration of SARS-CoV-2 viral copies this assay can detect is 138 copies/mL. A negative result does not preclude  SARS-Cov-2 infection and should not be used as the sole basis for treatment or other patient management decisions. A negative result may occur with  improper specimen collection/handling, submission of specimen other than nasopharyngeal swab, presence of viral mutation(s) within the areas targeted by this assay, and inadequate number of viral copies(<138 copies/mL). A negative result must be combined with clinical observations, patient history, and epidemiological information. The expected result is Negative.  Fact Sheet for Patients:  EntrepreneurPulse.com.au  Fact Sheet for Healthcare Providers:  IncredibleEmployment.be  This test is no t yet approved or cleared by the [place] FDA and  has been authorized for detection and/or diagnosis of SARS-CoV-2 by FDA under  an Emergency Use Authorization (EUA). This EUA will remain  in effect (meaning this test can be used) for the duration of the COVID-19 declaration under Section 564(b)(1) of the Act, 21 U.S.C.section 360bbb-3(b)(1), unless the authorization is terminated  or revoked sooner.       Influenza A by PCR NEGATIVE NEGATIVE Final   Influenza B by PCR NEGATIVE NEGATIVE Final    Comment: (NOTE) The Xpert Xpress SARS-CoV-2/FLU/RSV plus assay is intended as an aid in the diagnosis of influenza from Nasopharyngeal swab specimens and should not be used as a sole basis for treatment. Nasal washings and aspirates are unacceptable for Xpert Xpress SARS-CoV-2/FLU/RSV testing.  Fact Sheet for Patients: EntrepreneurPulse.com.au  Fact Sheet for Healthcare Providers: IncredibleEmployment.be  This test is not yet approved or cleared by the [place] FDA and has been authorized for detection and/or diagnosis of SARS-CoV-2 by FDA under an Emergency Use Authorization (EUA). This EUA will remain in effect (meaning this test can be used) for the duration of  the COVID-19 declaration under Section 564(b)(1) of the Act, 21 U.S.C. section 360bbb-3(b)(1), unless the authorization is terminated or revoked.  Performed at [facility], [address] [place], [place] [address]      Time coordinating discharge: 30 minutes The [place] controlled substances registry was reviewed for this patient prior to filling the <5 days supply controlled substances script.         SIGNED:   Edwin Dada, MD  [facility] 06/13/2021, 5:16 PM

## 2021-06-13 NOTE — Evaluation (Signed)
Physical Therapy Evaluation Patient Details Name: Sandra Jordan MRN: [id number] DOB: 04-Nov-1950 Today's Date: 06/13/2021   History of Present Illness  70 year old female former smoker who was presented to the emergency room with progressive dizziness nausea and vomiting with hypokalemia.  CT scan of the chest showed obstructing central right upper lobe mass with cut off of the right upper lobe bronchus with complete right upper lobe collapse highly suspicious for primary bronchogenic carcinoma.  Clinical Impression  Pt did relatively well with PT exam.  She showed some general unsteadiness - actually did well with testing but had intermittent stagger stepping/lateral lean and unsteadiness t/o the prolonged ambulation bout.  She did ultimately ambulate ~250 ft and negotiate up down 6 steps w/o needing direct assist but close supervision due to this unsteadiness.  Good overall effort, has had some falls, showed some safety concerns would benefit from HHPT and DME listed below.      Follow Up Recommendations Home health PT;Supervision - Intermittent    Equipment Recommendations  Rolling Scriven with 5" wheels;3in1 (PT)    Recommendations for Other Services       Precautions / Restrictions Precautions Precautions: None Restrictions Weight Bearing Restrictions: No      Mobility  Bed Mobility Overal bed mobility: Modified Independent             General bed mobility comments: Pt able to rise w/o assist, mild unsteadiness with initial rising    Transfers Overall transfer level: Modified independent Equipment used: Rolling Leary (2 wheeled) Transfers: Sit to/from Stand Sit to Stand: Supervision            Ambulation/Gait Ambulation/Gait assistance: Supervision Gait Distance (Feet): 250 Feet Assistive device: Rolling Marschall (2 wheeled)       General Gait Details: Pt showed good confidence and only minimal fatigue with ambulatin.  She generally showed good  confidence and stability but had unpredictable moments of stagger stepping that she did self correct with the Shave.  Pt managed ~30 ft w/o AD with concuent slower more guarded effort and minimal stagger stepping.  Pt more stable with the Loh and subjectively reports feeling much better with it.  Stairs Stairs: Yes Stairs assistance: Min guard Stair Management: Two rails Number of Stairs: 6 General stair comments: Pt was able to negotiate up/down 6 steps reciprocally, definite need of UEs but no physical assist required  Wheelchair Mobility    Modified Rankin (Stroke Patients Only)       Balance Overall balance assessment: Mild deficits observed, not formally tested (reports 2 falls in the last 6 months, increased frequency of unsteadiness recently)                                           Pertinent Vitals/Pain Pain Assessment: No/denies pain    Home Living Family/patient expects to be discharged to:: Private residence Living Arrangements: Other relatives Available Help at Discharge: Family;Available 24 hours/day (lives with brother)   Home Access: Stairs to enter Entrance Stairs-Rails: Can reach both Technical brewer of Steps: 6 Home Layout: One level        Prior Function Level of Independence: Independent         Comments: Pt reports being able to be active in community with brother, has had increased unsteadiness and more recently falls/staggering.     Hand Dominance        Extremity/Trunk Assessment  Upper Extremity Assessment Upper Extremity Assessment: Overall WFL for tasks assessed;Generalized weakness    Lower Extremity Assessment Lower Extremity Assessment: Overall WFL for tasks assessed;Generalized weakness       Communication   Communication: No difficulties  Cognition Arousal/Alertness: Awake/alert Behavior During Therapy: WFL for tasks assessed/performed Overall Cognitive Status: Within Functional Limits for  tasks assessed                                        General Comments      Exercises     Assessment/Plan    PT Assessment Patient needs continued PT services  PT Problem List Decreased activity tolerance;Decreased balance;Decreased safety awareness;Decreased knowledge of use of DME       PT Treatment Interventions DME instruction;Gait training;Stair training;Functional mobility training;Therapeutic activities;Balance training;Neuromuscular re-education;Patient/family education    PT Goals (Current goals can be found in the Care Plan section)  Acute Rehab PT Goals Patient Stated Goal: go home PT Goal Formulation: With patient Time For Goal Achievement: 06/27/21 Potential to Achieve Goals: Good    Frequency Min 2X/week   Barriers to discharge        Co-evaluation               AM-PAC PT "6 Clicks" Mobility  Outcome Measure Help needed turning from your back to your side while in a flat bed without using bedrails?: None Help needed moving from lying on your back to sitting on the side of a flat bed without using bedrails?: None Help needed moving to and from a bed to a chair (including a wheelchair)?: None Help needed standing up from a chair using your arms (e.g., wheelchair or bedside chair)?: None Help needed to walk in hospital room?: A Little Help needed climbing 3-5 steps with a railing? : A Little 6 Click Score: 22    End of Session Equipment Utilized During Treatment: Gait belt Activity Tolerance: Patient tolerated treatment well Patient left: in bed;with call bell/phone within reach;with family/visitor present Nurse Communication: Mobility status PT Visit Diagnosis: Muscle weakness (generalized) (M62.81);Unsteadiness on feet (R26.81)    Time: 3235-5732 PT Time Calculation (min) (ACUTE ONLY): 15 min   Charges:   PT Evaluation $PT Eval Low Complexity: 1 Low          Kreg Shropshire, DPT 06/13/2021, 4:08 PM

## 2021-06-13 NOTE — Progress Notes (Signed)
   06/13/21 0935  Clinical Encounter Type  Visited With Patient  Visit Type Follow-up  Referral From Nurse  Consult/Referral To Chaplain  Spiritual Encounters  Spiritual Needs Brochure  Chaplain Gordan Grell provided information about AD document. The pt received the pamphlet and stated, she would like to take it home and let her nephew review the document.

## 2021-06-13 NOTE — Consult Note (Signed)
NEW PATIENT EVALUATION  Name: Sandra Jordan  MRN: [id number]  Date:   06/12/2021     DOB: [birth year]   This 70 y.o. female patient presents t for evaluation of probable stage IV lung cancer with brain metastasis  REFERRING PHYSICIAN: No ref. provider found  CHIEF COMPLAINT:  Chief Complaint  Patient presents with   Dizziness    DIAGNOSIS: The primary encounter diagnosis was Hilar mass. Diagnoses of Nausea & vomiting, Mass of brain, and Hypokalemia were also pertinent to this visit.   PREVIOUS INVESTIGATIONS:  MRI scan of brain CT scan of chest reviewed Clinical notes reviewed Labs reviewed  HPI: Patient is a 70 year old female former smoker who was presented to the emergency room with progressive dizziness nausea and vomiting with hypokalemia.  CT scan of the chest showed obstructing central right upper lobe mass with cut off of the right upper lobe bronchus with complete right upper lobe collapse highly suspicious for primary bronchogenic carcinoma.  There was bilateral supraclavicular and mediastinal adenopathyWith a predominantly solid 2.5 cm right lower lobe pulmonary nodule suspicious for metastasis.  MRI scan of the brain showed innumerable intracranial parenchymal metastasis within the subtentorial and infratentorial brain.  There were larger lesions measuring up to 2 cm.  There is mild vasogenic edema all consistent with metastatic disease to the brain.  She has been put on steroids and her neurologic complaints have subsided.  I been asked to evaluate her for possible palliative radiation therapy to her brain.  She is seen today in her hospital room she is having no focal neurologic deficits no change in visual fields.  PLANNED TREATMENT REGIMEN: Whole brain radiation  PAST MEDICAL HISTORY:  has a past medical history of Allergy, Colon polyp, Hyperlipidemia, and Hypertension.    PAST SURGICAL HISTORY:  Past Surgical History:  Procedure Laterality Date   ABDOMINAL  HYSTERECTOMY  1984   BREAST BIOPSY Bilateral 1990   fibrocystic breast   BREAST BIOPSY Right August 2014   fibrocystic breast   CHOLECYSTECTOMY  2008   COLONOSCOPY  2012, 2017   [facility]    FAMILY HISTORY: family history includes Cancer in her mother and sister; Colon cancer (age of onset: 8) in her father; Prostate cancer in her father.  SOCIAL HISTORY:  reports that she quit smoking about 16 years ago. She has never used smokeless tobacco. She reports that she does not drink alcohol and does not use drugs.  ALLERGIES: Patient has no known allergies.  MEDICATIONS:  Current Facility-Administered Medications  Medication Dose Route Frequency Provider Last Rate Last Admin   dexamethasone (DECADRON) injection 10 mg  10 mg Intravenous Q24H Tu, Ching T, DO   10 mg at 06/13/21 0916   ondansetron (ZOFRAN) injection 4 mg  4 mg Intravenous Q6H PRN Tu, Ching T, DO        ECOG PERFORMANCE STATUS:  1 - Symptomatic but completely ambulatory  REVIEW OF SYSTEMS: Patient denies any weight loss, fatigue, weakness, fever, chills or night sweats. Patient denies any loss of vision, blurred vision. Patient denies any ringing  of the ears or hearing loss. No irregular heartbeat. Patient denies heart murmur or history of fainting. Patient denies any chest pain or pain radiating to her upper extremities. Patient denies any shortness of breath, difficulty breathing at night, cough or hemoptysis. Patient denies any swelling in the lower legs. Patient denies any nausea vomiting, vomiting of blood, or coffee ground material in the vomitus. Patient denies any stomach pain. Patient states has  had normal bowel movements no significant constipation or diarrhea. Patient denies any dysuria, hematuria or significant nocturia. Patient denies any problems walking, swelling in the joints or loss of balance. Patient denies any skin changes, loss of hair or loss of weight. Patient denies any excessive worrying or anxiety or  significant depression. Patient denies any problems with insomnia. Patient denies excessive thirst, polyuria, polydipsia. Patient denies any swollen glands, patient denies easy bruising or easy bleeding. Patient denies any recent infections, allergies or URI. Patient "s visual fields have not changed significantly in recent time.   PHYSICAL EXAM: BP (!) 128/100 (BP Location: Right Arm)   Pulse (!) 101   Temp 98.7 F (37.1 C)   Resp 18   Ht 5\' 3"  (1.6 m)   Wt 138 lb 7.2 oz (62.8 kg)   SpO2 100%   BMI 24.53 kg/m  Well-developed well-nourished patient in NAD. HEENT reveals PERLA, EOMI, discs not visualized.  Oral cavity is clear. No oral mucosal lesions are identified. Neck is clear without evidence of cervical or supraclavicular adenopathy. Lungs are clear to A&P. Cardiac examination is essentially unremarkable with regular rate and rhythm without murmur rub or thrill. Abdomen is benign with no organomegaly or masses noted. Motor sensory and DTR levels are equal and symmetric in the upper and lower extremities. Cranial nerves II through XII are grossly intact. Proprioception is intact. No peripheral adenopathy or edema is identified. No motor or sensory levels are noted. Crude visual fields are within normal range.  LABORATORY DATA: Labs reviewed    RADIOLOGY RESULTS: CT scan of chest and MRI of brain reviewed compatible with above-stated findings   IMPRESSION: Stage IV lung cancer with brain metastasis in 70 year old female  PLAN: At this time elect to start treatment planning on whole brain radiation plan on delivering 30 Gray in 10 fractions.  In the meantime she will probably be reviewed by medical oncology and ordered to have tissue diagnosis for confirmation of malignancy.  Risks and benefits of whole brain radiation including skin reaction fatigue loss of hair all were reviewed in detail with the patient.  Her brother was present during my consultation.  I have personally set up and  ordered CT simulation for tomorrow.  Consent was signed.  I would like to take this opportunity to thank you for allowing me to participate in the care of your patient.Noreene Filbert, MD

## 2021-06-14 ENCOUNTER — Telehealth: Payer: Self-pay

## 2021-06-14 ENCOUNTER — Ambulatory Visit
Admission: RE | Admit: 2021-06-14 | Discharge: 2021-06-14 | Disposition: A | Discharge status: Home / Self Care | Payer: Medicare Other | Source: Ambulatory Visit | Attending: Radiation Oncology | Admitting: Radiation Oncology

## 2021-06-14 ENCOUNTER — Encounter: Payer: Self-pay | Admitting: *Deleted

## 2021-06-14 DIAGNOSIS — C7931 Secondary malignant neoplasm of brain: Secondary | ICD-10-CM | POA: Insufficient documentation

## 2021-06-14 DIAGNOSIS — R590 Localized enlarged lymph nodes: Secondary | ICD-10-CM

## 2021-06-14 DIAGNOSIS — Z510 Encounter for antineoplastic radiation therapy: Secondary | ICD-10-CM | POA: Diagnosis present

## 2021-06-14 DIAGNOSIS — R918 Other nonspecific abnormal finding of lung field: Secondary | ICD-10-CM

## 2021-06-14 DIAGNOSIS — C3411 Malignant neoplasm of upper lobe, right bronchus or lung: Secondary | ICD-10-CM | POA: Insufficient documentation

## 2021-06-14 NOTE — Progress Notes (Signed)
Referral received to help coordinate outpatient follow up for patient after discharge. Per Dr. Grayland Ormond, pt will need PET scan, biopsy, and port placement. Will help schedule appts and hospital follow up with Dr. Grayland Ormond a few days after biopsy. Orders placed and will call pt with appts once scheduled.

## 2021-06-14 NOTE — Telephone Encounter (Signed)
Patient discharged from the hospital yesterday evening after [facility] team hours.  RNCM reached out to patient this morning to arrange home health services and DME.   PT recommended home health and patient is in agreement with services being set up.  Patient does not have a preference and is okay with [facility].  Corene Cornea with [facility] given referral.  [facility] and 3 in 1 also recommended and patient could use at home.  [facility] given referral for [facility] and [facility].  [facility] reports that the DME will be drop shipped and should be there within 3-5 days.    Patient and family given this RNCM number if they have any further questions.

## 2021-06-15 ENCOUNTER — Other Ambulatory Visit: Payer: Self-pay | Admitting: *Deleted

## 2021-06-15 DIAGNOSIS — C7931 Secondary malignant neoplasm of brain: Secondary | ICD-10-CM

## 2021-06-15 NOTE — Addendum Note (Signed)
Addended by: Telford Nab on: 06/15/2021 10:29 AM   Modules accepted: Orders

## 2021-06-18 DIAGNOSIS — Z510 Encounter for antineoplastic radiation therapy: Secondary | ICD-10-CM | POA: Diagnosis not present

## 2021-06-19 ENCOUNTER — Other Ambulatory Visit: Payer: Self-pay | Admitting: Radiology

## 2021-06-19 NOTE — Progress Notes (Signed)
Patient on schedule for [facility] placement/Supraclavicular LN biopsy 9-/13, called and spoke with patient on phone with pre procedure instructions given. Made aware to be here @ 0830, NPO after MN tonight as well as driver post procedure/ recovery/discharge. Stated understanding.

## 2021-06-20 ENCOUNTER — Ambulatory Visit: Admission: RE | Admit: 2021-06-20 | Payer: Medicare Other | Source: Ambulatory Visit

## 2021-06-20 ENCOUNTER — Other Ambulatory Visit: Payer: Self-pay

## 2021-06-20 ENCOUNTER — Encounter: Payer: Self-pay | Admitting: Radiology

## 2021-06-20 ENCOUNTER — Ambulatory Visit
Admission: RE | Admit: 2021-06-20 | Discharge: 2021-06-20 | Disposition: A | Discharge status: Home / Self Care | Payer: Medicare Other | Source: Ambulatory Visit | Attending: Oncology | Admitting: Oncology

## 2021-06-20 ENCOUNTER — Ambulatory Visit: Payer: Medicare Other | Admitting: Radiology

## 2021-06-20 DIAGNOSIS — Z452 Encounter for adjustment and management of vascular access device: Secondary | ICD-10-CM | POA: Diagnosis not present

## 2021-06-20 DIAGNOSIS — Z87891 Personal history of nicotine dependence: Secondary | ICD-10-CM | POA: Diagnosis not present

## 2021-06-20 DIAGNOSIS — R590 Localized enlarged lymph nodes: Secondary | ICD-10-CM | POA: Insufficient documentation

## 2021-06-20 DIAGNOSIS — R918 Other nonspecific abnormal finding of lung field: Secondary | ICD-10-CM

## 2021-06-20 DIAGNOSIS — Z510 Encounter for antineoplastic radiation therapy: Secondary | ICD-10-CM | POA: Diagnosis not present

## 2021-06-20 HISTORY — PX: IR IMAGING GUIDED PORT INSERTION: IMG5740

## 2021-06-20 MED ORDER — LIDOCAINE-EPINEPHRINE 1 %-1:100000 IJ SOLN
INTRAMUSCULAR | Status: AC
  Administered 2021-06-20: 10 mL
  Filled 2021-06-20: qty 1

## 2021-06-20 MED ORDER — MIDAZOLAM HCL 2 MG/2ML IJ SOLN
INTRAMUSCULAR | Status: AC
  Filled 2021-06-20: qty 4

## 2021-06-20 MED ORDER — MIDAZOLAM HCL 2 MG/2ML IJ SOLN
INTRAMUSCULAR | Status: DC | PRN
  Administered 2021-06-20 (×2): 1 mg via INTRAVENOUS
  Administered 2021-06-20 (×2): .5 mg via INTRAVENOUS

## 2021-06-20 MED ORDER — FENTANYL CITRATE (PF) 100 MCG/2ML IJ SOLN
INTRAMUSCULAR | Status: AC
  Filled 2021-06-20: qty 2

## 2021-06-20 MED ORDER — FENTANYL CITRATE (PF) 100 MCG/2ML IJ SOLN
INTRAMUSCULAR | Status: DC | PRN
  Administered 2021-06-20: 50 ug via INTRAVENOUS
  Administered 2021-06-20 (×2): 25 ug via INTRAVENOUS

## 2021-06-20 MED ORDER — HEPARIN SOD (PORK) LOCK FLUSH 100 UNIT/ML IV SOLN
INTRAVENOUS | Status: AC
  Administered 2021-06-20: 500 [IU]
  Filled 2021-06-20: qty 5

## 2021-06-20 MED ORDER — SODIUM CHLORIDE 0.9 % IV SOLN
INTRAVENOUS | Status: DC
  Filled 2021-06-20: qty 1000

## 2021-06-20 NOTE — Procedures (Signed)
Interventional Radiology Procedure Note  Procedure: 1. Left chest port 2. Right supraclavicular lymph node biopsy  Indication: Lung mass with metastatic lymph adenopathy  Findings: Please refer to procedural dictation for full description.  Complications: None  EBL: < 10 mL  Miachel Roux, MD [phone]

## 2021-06-20 NOTE — H&P (Signed)
Chief Complaint: Patient was seen in consultation today for right upper lung mass and lymphadenopathy at the request of Finnegan,Timothy J  Referring Physician(s): Finnegan,Timothy J  Supervising Physician: Mir, Sharen Heck  Patient Status: [facility] - Out-pt  History of Present Illness: Sandra Jordan is a 70 y.o. female with recently found right upper lung mass with brain metastasis and lymphadenopathy, concern for stage IV lung cancer. The patient has been seen by oncology, Dr. Grayland Ormond on 9/6 and IR received request for image guided port a catheter placement for initiation of treatment and image guided supraclavicular biopsy for diagnosis. The patient has had a H&P performed within the last 30 days, all history, medications, and exam have been reviewed. The patient denies any interval changes since the H&P.  The patient denies any chest pain, shortness of breath or palpitations. The patient denies any recent infections, fever or chills. The patient denies any history of sleep apnea or chronic oxygen use. She has no known complications to sedation.    Past Medical History:  Diagnosis Date   Allergy    Colon polyp    Hyperlipidemia    Hypertension     Past Surgical History:  Procedure Laterality Date   ABDOMINAL HYSTERECTOMY  1984   BREAST BIOPSY Bilateral 1990   fibrocystic breast   BREAST BIOPSY Right August 2014   fibrocystic breast   CHOLECYSTECTOMY  2008   COLONOSCOPY  2012, 2017   [facility]    Allergies: Patient has no known allergies.  Medications: Prior to Admission medications   Medication Sig Start Date End Date Taking? Authorizing Provider  cetirizine (ZYRTEC) 10 MG tablet Take 10 mg by mouth daily.    [provider]  cyanocobalamin 1000 MCG tablet Take by mouth.    [provider]  dexamethasone (DECADRON) 4 MG tablet Take 1 tablet (4 mg total) by mouth 2 (two) times daily with a meal. 06/13/21   Danford, Suann Larry, MD  fluticasone  (FLONASE) 50 MCG/ACT nasal spray Place into both nostrils. 06/07/21   [provider]  hydrALAZINE (APRESOLINE) 25 MG tablet Take 25 mg by mouth 2 (two) times daily. 06/07/21   [provider]  hydrochlorothiazide (HYDRODIURIL) 25 MG tablet Take 25 mg by mouth daily.    [provider]  ibuprofen (ADVIL) 800 MG tablet Take 800 mg by mouth every 6 (six) hours as needed. 03/29/21   [provider]  lovastatin (MEVACOR) 20 MG tablet Take 20 mg by mouth at bedtime.    [provider]  lovastatin (MEVACOR) 40 MG tablet SMARTSIG:1 Tablet(s) By Mouth Every Evening 04/14/21   [provider]  meloxicam (MOBIC) 7.5 MG tablet Take 7.5 mg by mouth daily. 06/12/21   [provider]  potassium chloride (KLOR-CON) 10 MEQ tablet Take 10 mEq by mouth daily. 06/07/21   [provider]     Family History  Problem Relation Age of Onset   Cancer Mother        lung   Cancer Sister        lung   Colon cancer Father 63   Prostate cancer Father     Social History   Socioeconomic History   Marital status: Single    Spouse name: Not on file   Number of children: Not on file   Years of education: Not on file   Highest education level: Not on file  Occupational History   Not on file  Tobacco Use   Smoking status: Former  Years: 15.00    Types: Cigarettes    Quit date: 10/08/2004    Years since quitting: 16.7   Smokeless tobacco: Never  Substance and Sexual Activity   Alcohol use: No    Alcohol/week: 0.0 standard drinks   Drug use: No   Sexual activity: Not on file  Other Topics Concern   Not on file  Social History Narrative   Not on file   Social Determinants of Health   Financial Resource Strain: Not on file  Food Insecurity: Not on file  Transportation Needs: Not on file  Physical Activity: Not on file  Stress: Not on file  Social Connections: Not on file   Review of Systems: A 12 point ROS discussed and pertinent  positives are indicated in the HPI above.  All other systems are negative.  Review of Systems  Vital Signs: BP (!) 143/87   Pulse 93   Temp 98.2 F (36.8 C) (Oral)   Resp 15   Ht 5' 3.5" (1.613 m)   Wt 141 lb (64 kg)   SpO2 99%   BMI 24.59 kg/m   Physical Exam Constitutional:      Appearance: Normal appearance.  HENT:     Head: Normocephalic and atraumatic.  Cardiovascular:     Rate and Rhythm: Normal rate and regular rhythm.  Pulmonary:     Effort: Pulmonary effort is normal. No respiratory distress.  Neurological:     Mental Status: She is alert and oriented to person, place, and time.    Imaging: CT HEAD WO CONTRAST (5MM)  Result Date: 06/12/2021 CLINICAL DATA:  Dizziness. EXAM: CT HEAD WITHOUT CONTRAST TECHNIQUE: Contiguous axial images were obtained from the base of the skull through the vertex without intravenous contrast. COMPARISON:  None. FINDINGS: Brain: Multiple mass like lesions are present with surrounding edema. Hyperdense mass in the left frontal lobe measures 18 x 18 x 17 mm. A right occipital lesion measures 13 x 11 x 15 mm with surrounding vasogenic edema mass effect. A lesion in the left cerebellum just below the tentorium measures 17 x 13 x 9 mm with vasogenic edema and mass effect in the left hemisphere. There is some mass effect on the fourth ventricle without obstruction. A left frontal lobe lesion near the vertex measures up to 12 mm. There is some surrounding edema. Hyperdense sellar/suprasellar mass measures 26 x 17 x 18 mm. This creates mass effect on the optic chiasm. Ventricles are of normal size. No significant extraaxial fluid collection is present. Vascular: No hyperdense vessel or unexpected calcification. Skull: Calvarium is intact. No focal lytic or blastic lesions are present. No significant extracranial soft tissue lesion is present. Sinuses/Orbits: The paranasal sinuses and mastoid air cells are clear. The globes and orbits are within normal  limits. IMPRESSION: 1. Multiple mass like lesions with surrounding edema and mass effect in the brain, as described. The largest lesion is in the left frontal lobe measuring 18 x 18 x 17 mm. The findings are concerning for metastatic disease to the brain. MRI of the brain without and with contrast is recommended for further evaluation. 2. Hyperdense sellar/suprasellar mass measures 26 x 17 x 18 mm. This creates mass effect on the optic chiasm. This could represent a pituitary macroadenoma. Additional metastatic lesion also considered. Electronically Signed   By: San Morelle M.D.   On: 06/12/2021 12:09   MR Brain W and Wo Contrast  Result Date: 06/12/2021 CLINICAL DATA:  Brain mass or lesion. EXAM: MRI HEAD WITHOUT AND  WITH CONTRAST TECHNIQUE: Multiplanar, multiecho pulse sequences of the brain and surrounding structures were obtained without and with intravenous contrast. CONTRAST:  62mL MAGNEVIST GADOPENTETATE DIMEGLUMINE 469.01 MG/ML IV SOLN COMPARISON:  Head CT performed earlier today 06/12/2021. FINDINGS: Brain: Cerebral volume is normal. There are numerable enhancing parenchymal metastases within the supratentorial brain, brainstem and bilateral cerebellar hemispheres compatible with intracranial metastatic disease. The 2 largest supratentorial lesions measure 2.0 x 1.9 x 2.0 cm within the mid left frontal lobe/left frontal operculum (series 19, image 95) and 2.5 x 2.0 x 2.7 cm within the right occipital lobe (for instance as seen on series 19, image 70). Additionally, there is an enhancing mass centered in the region of the anteroinferior third ventricle, hypothalamus and suprasellar region measuring 1.5 x 1.9 x 2.2 cm. This mass is located posterior to the optic chiasm, anteriorly displacing and exerting mass effect upon the optic chiasm. This lesion also encroaches upon the anteroinferior aspect of the third ventricle. The largest infratentorial lesion is located within the left cerebellum and  measures 4.0 x 3.4 x 2.9 cm. There is mild edema surrounding many of these lesions. Additionally, many of these lesions demonstrate internal SWI signal loss which may reflect hemorrhage and/or mineralization. Local mass effect associated with the largest supratentorial lesions. No midline shift. Significant mass effect associated with the dominant left cerebellar lesion with rightward deviation and partial effacement of the fourth ventricle. No evidence of hydrocephalus at this time. Background mild multifocal T2/FLAIR hyperintensity within the cerebral white matter, nonspecific but compatible with chronic small vessel ischemic disease. There is no acute infarct. No extra-axial fluid collection. Vascular: Maintained flow voids within the proximal large arterial vessels. Skull and upper cervical spine: No focal suspicious marrow lesion. Incompletely assessed cervical spondylosis. Sinuses/Orbits: Visualized orbits show no acute finding. No significant paranasal sinus disease. Other: Nonspecific 10 mm lesion associated with the left external ear. IMPRESSION: Innumerable intracranial parenchymal metastases within the supratentorial and infratentorial brain, as described. Measurements for the largest supratentorial and infratentorial lesions have been provided. Additional 2.2 x 1.9 cm enhancing mass centered in the region of the anteroinferior third ventricle, hypothalamus and suprasellar region which is also favored to reflect a metastasis. This mass is located posterior to the optic chiasm, anteriorly displacing and exerting mass effect upon the optic chiasm. This lesion also encroaches upon the anteroinferior aspect of the third ventricle. There is mild edema surrounding many of the lesions. Additionally, there is SWI signal loss associated with many of the lesions which may reflect mineralization and/or hemorrhage. Mass effect associated with the dominant left cerebellar lesion with rightward deviation and partial  effacement of the fourth ventricle. No evidence of hydrocephalus at this time. Background mild chronic small vessel ischemic changes within cerebral white matter. Nonspecific 10 mm round lesion associated with the left external ear. Direct visualization recommended. Electronically Signed   By: Kellie Simmering D.O.   On: 06/12/2021 14:35   CT CHEST ABDOMEN PELVIS W CONTRAST  Result Date: 06/12/2021 CLINICAL DATA:  Dizziness. Vomiting. Abdominal pain. Abnormal chest radiograph. EXAM: CT CHEST, ABDOMEN, AND PELVIS WITH CONTRAST TECHNIQUE: Multidetector CT imaging of the chest, abdomen and pelvis was performed following the standard protocol during bolus administration of intravenous contrast. CONTRAST:  15mL OMNIPAQUE IOHEXOL 350 MG/ML SOLN COMPARISON:  Chest radiograph from earlier today. 02/19/2017 CT abdomen/pelvis. FINDINGS: CT CHEST FINDINGS Cardiovascular: Normal heart size. No significant pericardial effusion/thickening. Mildly atherosclerotic nonaneurysmal thoracic aorta. Normal caliber pulmonary arteries. No central pulmonary emboli. Mediastinum/Nodes: Hypodense 1.0 cm right  thyroid nodule. Unremarkable esophagus. Heterogeneously enhancing bilateral supraclavicular adenopathy measuring 2.1 cm short axis diameter on the left (series 2/image 7) and 1.4 cm on the right (series 2/image 8). No right axillary adenopathy. Enlarged heterogeneous 2.7 cm left axillary node (series 2/image 14). Enlarged 1.1 cm left retropectoral node (series 2/image 13). Enlarged heterogeneous bilateral prevascular mediastinal nodes, largest 2.0 cm on the left (series 2/image 19). Enlarged 2.9 cm AP window heterogeneous node (series 2/image 21). Bulky confluent heterogeneous right paratracheal adenopathy up to 3.0 cm (series 2/image 19). Enlarged heterogeneous 2.9 cm subcarinal node (series 2/image 25). Enlarged heterogeneous 2.6 cm right hilar node (series 2/image 21). No pathologically enlarged left hilar nodes. Lungs/Pleura: No  pneumothorax. No pleural effusion. Obstructing central right upper lobe lung mass with abrupt cut off of the right upper lobe bronchus, poorly delineated given complete right upper lobe collapse, approximately 6.0 x 5.6 cm (series 2/image 14). Predominantly solid 2.5 x 2.1 cm peripheral right lower lobe pulmonary nodule (series 4/image 76). Several (at least 8) additional subcentimeter solid pulmonary nodules in both lungs, largest 0.6 cm in the posterior right middle lobe (series 4/image 77). Musculoskeletal: No aggressive appearing focal osseous lesions. Marked thoracic spondylosis. CT ABDOMEN PELVIS FINDINGS Hepatobiliary: Normal liver with no liver mass. Cholecystectomy. No biliary ductal dilatation. Pancreas: Normal, with no mass or duct dilation. Spleen: Normal size. No mass. Adrenals/Urinary Tract: Normal adrenals. Nonobstructing 5 mm lower left renal stone. Subcentimeter hypodense bilateral renal cortical lesions are too small to characterize and require no follow-up. No hydronephrosis. Normal bladder. Stomach/Bowel: Small hiatal hernia. Otherwise normal nondistended stomach. Normal caliber small bowel with no small bowel wall thickening. Normal appendix. Mild left colonic diverticulosis with no large bowel wall thickening or significant pericolonic fat stranding. Vascular/Lymphatic: Atherosclerotic nonaneurysmal abdominal aorta. Patent portal, splenic, hepatic and renal veins. No pathologically enlarged lymph nodes in the abdomen or pelvis. Reproductive: Status post hysterectomy, with no abnormal findings at the vaginal cuff. No adnexal mass. Other: No pneumoperitoneum, ascites or focal fluid collection. Musculoskeletal: No aggressive appearing focal osseous lesions. Moderate lumbar spondylosis. IMPRESSION: 1. Suspected obstructing central right upper lobe lung mass with abrupt cut off of the right upper lobe bronchus, poorly delineated given complete right upper lobe collapse, estimated at 6.0 cm size,  highly suspicious for primary bronchogenic carcinoma. 2. Bilateral supraclavicular, bilateral mediastinal, right hilar, left retropectoral and left axillary lymphadenopathy with heterogeneous enhancement, compatible with metastatic disease. 3. Predominantly solid 2.5 cm right lower lobe pulmonary nodule is suspicious for metastasis versus a second primary lung neoplasm. Several (at least 8) additional subcentimeter solid pulmonary nodules scattered in both lungs, suspect pulmonary metastases. 4. Multi disciplinary thoracic oncology consultation suggested. Consider PET-CT for further staging evaluation. 5. No evidence of metastatic disease in the abdomen or pelvis. 6. Small hiatal hernia. 7. Mild left colonic diverticulosis. 8. Nonobstructing left nephrolithiasis. 9. Aortic Atherosclerosis (ICD10-I70.0). Electronically Signed   By: Ilona Sorrel M.D.   On: 06/12/2021 13:00   DG Chest Portable 1 View  Result Date: 06/12/2021 CLINICAL DATA:  Infiltrate.  Dizziness.  Emesis. EXAM: PORTABLE CHEST 1 VIEW COMPARISON:  None. FINDINGS: Heart size is normal. Right upper lobe collapse noted. Opacity in the right upper hemithorax. Patchy opacity noted in the right lower lobe. The left lung is clear. Axial skeleton unremarkable. IMPRESSION: 1. Right upper lobe collapse and patchy right lower lobe airspace disease. This raises concern for underlying obstruction. Recommend CT of the chest with contrast for further evaluation. 2. The left lung is clear. These  results were called by telephone at the time of interpretation on 06/12/2021 at 11:51 am to provider Merlyn Lot , who verbally acknowledged these results. Electronically Signed   By: San Morelle M.D.   On: 06/12/2021 11:54    Labs:  CBC: Recent Labs    06/12/21 1056  WBC 8.1  HGB 14.6  HCT 43.4  PLT 352    COAGS: No results for input(s): INR, APTT in the last 8760 hours.  BMP: Recent Labs    06/12/21 1056 06/13/21 0446  NA 138 144  K 2.7*  4.9  CL 100 110  CO2 27 27  GLUCOSE 114* 126*  BUN 8 8  CALCIUM 9.6 10.0  CREATININE 0.80 0.73  GFRNONAA >60 >60    LIVER FUNCTION TESTS: Recent Labs    06/12/21 1056  BILITOT 0.9  AST 36  ALT 41  ALKPHOS 100  PROT 7.7  ALBUMIN 4.0   Assessment and Plan: Right upper lung mass with brain metastasis and lymphadenopathy  Seen by oncology, Dr. Grayland Ormond 9/6  IR received request for image guided port a catheter placement for treatment and image guided supraclavicular biopsy for diagnosis   The patient has been NPO, no blood thinners taken, imaging, labs and vitals have been reviewed.  Risks and benefits of image guided port-a-catheter placement was discussed with the patient including, but not limited to bleeding, infection, pneumothorax, or fibrin sheath development and need for additional procedures.  Risks and benefits of supraclavicular biopsy was discussed with the patient and/or patient's family including, but not limited to bleeding, infection, damage to adjacent structures or low yield requiring additional tests.  All of the questions were answered and there is agreement to proceed.  Consent signed and in chart.  Thank you for this interesting consult.  I greatly enjoyed meeting Sandra Jordan and look forward to participating in their care.  A copy of this report was sent to the requesting provider on this date.  Electronically Signed: Hedy Jacob, PA-C 06/20/2021, 9:03 AM   I spent a total of 15 Minutes  in face to face in clinical consultation, greater than 50% of which was counseling/coordinating care for lymphadenopathy and lung mass.

## 2021-06-21 ENCOUNTER — Ambulatory Visit
Admission: RE | Admit: 2021-06-21 | Discharge: 2021-06-21 | Disposition: A | Discharge status: Home / Self Care | Payer: Medicare Other | Source: Ambulatory Visit | Attending: Radiation Oncology | Admitting: Radiation Oncology

## 2021-06-21 ENCOUNTER — Encounter: Payer: Self-pay | Admitting: *Deleted

## 2021-06-21 ENCOUNTER — Ambulatory Visit
Admission: RE | Admit: 2021-06-21 | Discharge: 2021-06-21 | Disposition: A | Discharge status: Home / Self Care | Payer: Medicare Other | Source: Ambulatory Visit | Attending: Oncology | Admitting: Oncology

## 2021-06-21 DIAGNOSIS — K5730 Diverticulosis of large intestine without perforation or abscess without bleeding: Secondary | ICD-10-CM | POA: Insufficient documentation

## 2021-06-21 DIAGNOSIS — R590 Localized enlarged lymph nodes: Secondary | ICD-10-CM | POA: Diagnosis present

## 2021-06-21 DIAGNOSIS — C3491 Malignant neoplasm of unspecified part of right bronchus or lung: Secondary | ICD-10-CM | POA: Insufficient documentation

## 2021-06-21 DIAGNOSIS — R918 Other nonspecific abnormal finding of lung field: Secondary | ICD-10-CM | POA: Diagnosis not present

## 2021-06-21 DIAGNOSIS — I700 Atherosclerosis of aorta: Secondary | ICD-10-CM | POA: Insufficient documentation

## 2021-06-21 DIAGNOSIS — K449 Diaphragmatic hernia without obstruction or gangrene: Secondary | ICD-10-CM | POA: Insufficient documentation

## 2021-06-21 DIAGNOSIS — N200 Calculus of kidney: Secondary | ICD-10-CM | POA: Diagnosis not present

## 2021-06-21 DIAGNOSIS — Z510 Encounter for antineoplastic radiation therapy: Secondary | ICD-10-CM | POA: Diagnosis not present

## 2021-06-21 LAB — GLUCOSE, CAPILLARY: Glucose-Capillary: 94 mg/dL (ref 70–99)

## 2021-06-21 MED ORDER — FLUDEOXYGLUCOSE F - 18 (FDG) INJECTION
7.3000 | Freq: Once | INTRAVENOUS | Status: AC
  Administered 2021-06-21: 7.35 via INTRAVENOUS

## 2021-06-21 NOTE — Progress Notes (Signed)
Met with patient and her family in the lobby after receiving radiation treatment today. Pt had PET scan this morning and noticed that her dressing from port placement on 9/13 was bloody. Dr. Grayland Ormond made aware. Dressing removed and area assessed. Incision site was clean, dry, and intact. No drainage noted from site. Site was left open to air and did not need another dressing. Reviewed all upcoming appts with pt and her family. All questions answered during visit. Instructed pt to call with any further questions or needs.

## 2021-06-22 ENCOUNTER — Other Ambulatory Visit: Payer: Self-pay | Admitting: Anatomic Pathology & Clinical Pathology

## 2021-06-22 ENCOUNTER — Ambulatory Visit
Admission: RE | Admit: 2021-06-22 | Discharge: 2021-06-22 | Disposition: A | Discharge status: Home / Self Care | Payer: Medicare Other | Source: Ambulatory Visit | Attending: Radiation Oncology | Admitting: Radiation Oncology

## 2021-06-22 DIAGNOSIS — Z510 Encounter for antineoplastic radiation therapy: Secondary | ICD-10-CM | POA: Diagnosis not present

## 2021-06-22 LAB — SURGICAL PATHOLOGY

## 2021-06-23 ENCOUNTER — Ambulatory Visit
Admission: RE | Admit: 2021-06-23 | Discharge: 2021-06-23 | Disposition: A | Discharge status: Home / Self Care | Payer: Medicare Other | Source: Ambulatory Visit | Attending: Radiation Oncology | Admitting: Radiation Oncology

## 2021-06-23 DIAGNOSIS — Z510 Encounter for antineoplastic radiation therapy: Secondary | ICD-10-CM | POA: Diagnosis not present

## 2021-06-25 NOTE — Progress Notes (Signed)
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  INTERVAL HISTORY: Patient returns to clinic today for hospital follow-up and further evaluation.  She continues to have persistent nausea and vomiting, but this is improved after receiving IV fluids and antiemetics yesterday.  She is otherwise tolerating XRT to her brain well.  She has chronic weakness and fatigue.  She has no neurologic complaints.  She denies any recent fevers or illnesses.  She has a poor appetite.  She has no chest pain, shortness of breath, cough, or hemoptysis.  She denies any nausea, vomiting, constipation, or diarrhea.  She has no urinary complaints.  Patient offers no further specific complaints today.  REVIEW OF SYSTEMS:   Review of Systems  Constitutional:  Positive for malaise/fatigue. Negative for fever and weight loss.  Respiratory: Negative.  Negative for cough, hemoptysis and shortness of breath.   Cardiovascular: Negative.  Negative for chest pain and leg swelling.  Gastrointestinal:  Positive for nausea and vomiting.  Genitourinary: Negative.  Negative for dysuria.  Musculoskeletal: Negative.  Negative for back pain.  Skin: Negative.  Negative for rash.  Neurological:  Positive for weakness. Negative for dizziness, focal weakness and headaches.  Psychiatric/Behavioral: Negative.  The patient is not nervous/anxious.    As per HPI. Otherwise, a complete review of systems is negative.  PAST MEDICAL HISTORY: Past Medical History:   Diagnosis Date   Allergy    Colon polyp    Hyperlipidemia    Hypertension     PAST SURGICAL HISTORY: Past Surgical History:  Procedure Laterality Date   ABDOMINAL HYSTERECTOMY  1984   BREAST BIOPSY Bilateral 1990   fibrocystic breast   BREAST BIOPSY Right August 2014   fibrocystic breast   CHOLECYSTECTOMY  2008   COLONOSCOPY  2012, 2017   [facility]   IR IMAGING GUIDED PORT INSERTION  06/20/2021    FAMILY HISTORY: Family History  Problem Relation Age of Onset   Lung cancer Mother    Colon cancer Father 53   Prostate cancer Father    Lung cancer Sister    Lung cancer Maternal Uncle    Stomach cancer Maternal Grandfather    Breast cancer Niece     ADVANCED DIRECTIVES (Y/N):  N  HEALTH MAINTENANCE: Social History   Tobacco Use   Smoking status: Former    Years: 15.00    Types: Cigarettes    Quit date: 10/08/2004    Years since quitting: 16.7   Smokeless tobacco: Never  Substance Use Topics   Alcohol use: No    Alcohol/week: 0.0 standard drinks   Drug use: No     Colonoscopy:  PAP:  Bone density:  Lipid panel:  No Known Allergies  Current Outpatient Medications  Medication Sig Dispense Refill   cetirizine (ZYRTEC) 10 MG tablet Take 10 mg by mouth daily.     dexamethasone (DECADRON) 4 MG tablet Take 1 tablet (4 mg total) by mouth 2 (two) times daily with a meal. 60 tablet 1   hydrALAZINE (APRESOLINE) 25 MG tablet Take 25 mg by mouth 2 (two) times daily.  hydrochlorothiazide (HYDRODIURIL) 25 MG tablet Take 25 mg by mouth daily. Prn if bp above 150/90     lidocaine-prilocaine (EMLA) cream Apply 1 application topically as needed. 30 g 0   lovastatin (MEVACOR) 40 MG tablet SMARTSIG:1 Tablet(s) By Mouth Every Evening     ondansetron (ZOFRAN-ODT) 8 MG disintegrating tablet Take 1 tablet (8 mg total) by mouth every 8 (eight) hours as needed for nausea or vomiting. 60 tablet 0   potassium chloride 20 MEQ/15ML (10%) SOLN Take 15 mLs (20 mEq total) by mouth 2 (two)  times daily. 473 mL 0   cyanocobalamin 1000 MCG tablet Take by mouth. (Patient not taking: Reported on 06/27/2021)     fluticasone (FLONASE) 50 MCG/ACT nasal spray Place into both nostrils. (Patient not taking: Reported on 06/27/2021)     ibuprofen (ADVIL) 800 MG tablet Take 800 mg by mouth every 6 (six) hours as needed. (Patient not taking: Reported on 06/27/2021)     lovastatin (MEVACOR) 20 MG tablet Take 20 mg by mouth at bedtime. (Patient not taking: Reported on 06/27/2021)     meloxicam (MOBIC) 7.5 MG tablet Take 7.5 mg by mouth daily. (Patient not taking: Reported on 06/27/2021)     potassium chloride (KLOR-CON) 10 MEQ tablet Take 10 mEq by mouth daily. (Patient not taking: Reported on 06/27/2021)     prochlorperazine (COMPAZINE) 10 MG tablet Take 1 tablet (10 mg total) by mouth every 6 (six) hours as needed for nausea or vomiting. (Patient not taking: Reported on 06/27/2021) 60 tablet 0   No current facility-administered medications for this visit.   Facility-Administered Medications Ordered in Other Visits  Medication Dose Route Frequency Provider Last Rate Last Admin   heparin lock flush 100 unit/mL  500 Units Intravenous Once Borders, Kirt Boys, NP        OBJECTIVE: Vitals:   06/27/21 1140  BP: 127/82  Pulse: (!) 110  Resp: 18  Temp: 98.1 F (36.7 C)     Body mass index is 22.06 kg/m.    ECOG FS:2 - Symptomatic, <50% confined to bed  General: Well-developed, well-nourished, no acute distress. Eyes: Pink conjunctiva, anicteric sclera. HEENT: Normocephalic, moist mucous membranes. Lungs: No audible wheezing or coughing. Heart: Regular rate and rhythm. Abdomen: Soft, nontender, no obvious distention. Musculoskeletal: No edema, cyanosis, or clubbing. Neuro: Alert, answering all questions appropriately. Cranial nerves grossly intact. Skin: No rashes or petechiae noted. Psych: Normal affect.  LAB RESULTS:  Lab Results  Component Value Date   NA 141 06/26/2021   K 2.9 (L)  06/26/2021   CL 95 (L) 06/26/2021   CO2 32 06/26/2021   GLUCOSE 114 (H) 06/26/2021   BUN 23 06/26/2021   CREATININE 0.98 06/26/2021   CALCIUM 9.0 06/26/2021   PROT 7.7 06/26/2021   ALBUMIN 3.9 06/26/2021   AST 64 (H) 06/26/2021   ALT 168 (H) 06/26/2021   ALKPHOS 94 06/26/2021   BILITOT 0.9 06/26/2021   GFRNONAA >60 06/26/2021   GFRAA >60 01/26/2013    Lab Results  Component Value Date   WBC 16.3 (H) 06/27/2021   NEUTROABS 10.1 (H) 06/26/2021   HGB 14.8 06/27/2021   HCT 45.5 06/27/2021   MCV 85.7 06/27/2021   PLT 244 06/27/2021     STUDIES: CT HEAD WO CONTRAST (5MM)  Result Date: 06/12/2021 CLINICAL DATA:  Dizziness. EXAM: CT HEAD WITHOUT CONTRAST TECHNIQUE: Contiguous axial images were obtained from the base of the skull through the vertex without intravenous contrast. COMPARISON:  None. FINDINGS: Brain: Multiple mass like  lesions are present with surrounding edema. Hyperdense mass in the left frontal lobe measures 18 x 18 x 17 mm. A right occipital lesion measures 13 x 11 x 15 mm with surrounding vasogenic edema mass effect. A lesion in the left cerebellum just below the tentorium measures 17 x 13 x 9 mm with vasogenic edema and mass effect in the left hemisphere. There is some mass effect on the fourth ventricle without obstruction. A left frontal lobe lesion near the vertex measures up to 12 mm. There is some surrounding edema. Hyperdense sellar/suprasellar mass measures 26 x 17 x 18 mm. This creates mass effect on the optic chiasm. Ventricles are of normal size. No significant extraaxial fluid collection is present. Vascular: No hyperdense vessel or unexpected calcification. Skull: Calvarium is intact. No focal lytic or blastic lesions are present. No significant extracranial soft tissue lesion is present. Sinuses/Orbits: The paranasal sinuses and mastoid air cells are clear. The globes and orbits are within normal limits. IMPRESSION: 1. Multiple mass like lesions with surrounding  edema and mass effect in the brain, as described. The largest lesion is in the left frontal lobe measuring 18 x 18 x 17 mm. The findings are concerning for metastatic disease to the brain. MRI of the brain without and with contrast is recommended for further evaluation. 2. Hyperdense sellar/suprasellar mass measures 26 x 17 x 18 mm. This creates mass effect on the optic chiasm. This could represent a pituitary macroadenoma. Additional metastatic lesion also considered. Electronically Signed   By: San Morelle M.D.   On: 06/12/2021 12:09   MR Brain W and Wo Contrast  Result Date: 06/12/2021 CLINICAL DATA:  Brain mass or lesion. EXAM: MRI HEAD WITHOUT AND WITH CONTRAST TECHNIQUE: Multiplanar, multiecho pulse sequences of the brain and surrounding structures were obtained without and with intravenous contrast. CONTRAST:  58mL MAGNEVIST GADOPENTETATE DIMEGLUMINE 469.01 MG/ML IV SOLN COMPARISON:  Head CT performed earlier today 06/12/2021. FINDINGS: Brain: Cerebral volume is normal. There are numerable enhancing parenchymal metastases within the supratentorial brain, brainstem and bilateral cerebellar hemispheres compatible with intracranial metastatic disease. The 2 largest supratentorial lesions measure 2.0 x 1.9 x 2.0 cm within the mid left frontal lobe/left frontal operculum (series 19, image 95) and 2.5 x 2.0 x 2.7 cm within the right occipital lobe (for instance as seen on series 19, image 70). Additionally, there is an enhancing mass centered in the region of the anteroinferior third ventricle, hypothalamus and suprasellar region measuring 1.5 x 1.9 x 2.2 cm. This mass is located posterior to the optic chiasm, anteriorly displacing and exerting mass effect upon the optic chiasm. This lesion also encroaches upon the anteroinferior aspect of the third ventricle. The largest infratentorial lesion is located within the left cerebellum and measures 4.0 x 3.4 x 2.9 cm. There is mild edema surrounding many of  these lesions. Additionally, many of these lesions demonstrate internal SWI signal loss which may reflect hemorrhage and/or mineralization. Local mass effect associated with the largest supratentorial lesions. No midline shift. Significant mass effect associated with the dominant left cerebellar lesion with rightward deviation and partial effacement of the fourth ventricle. No evidence of hydrocephalus at this time. Background mild multifocal T2/FLAIR hyperintensity within the cerebral white matter, nonspecific but compatible with chronic small vessel ischemic disease. There is no acute infarct. No extra-axial fluid collection. Vascular: Maintained flow voids within the proximal large arterial vessels. Skull and upper cervical spine: No focal suspicious marrow lesion. Incompletely assessed cervical spondylosis. Sinuses/Orbits: Visualized orbits show no acute  finding. No significant paranasal sinus disease. Other: Nonspecific 10 mm lesion associated with the left external ear. IMPRESSION: Innumerable intracranial parenchymal metastases within the supratentorial and infratentorial brain, as described. Measurements for the largest supratentorial and infratentorial lesions have been provided. Additional 2.2 x 1.9 cm enhancing mass centered in the region of the anteroinferior third ventricle, hypothalamus and suprasellar region which is also favored to reflect a metastasis. This mass is located posterior to the optic chiasm, anteriorly displacing and exerting mass effect upon the optic chiasm. This lesion also encroaches upon the anteroinferior aspect of the third ventricle. There is mild edema surrounding many of the lesions. Additionally, there is SWI signal loss associated with many of the lesions which may reflect mineralization and/or hemorrhage. Mass effect associated with the dominant left cerebellar lesion with rightward deviation and partial effacement of the fourth ventricle. No evidence of hydrocephalus at  this time. Background mild chronic small vessel ischemic changes within cerebral white matter. Nonspecific 10 mm round lesion associated with the left external ear. Direct visualization recommended. Electronically Signed   By: Kellie Simmering D.O.   On: 06/12/2021 14:35   CT CHEST ABDOMEN PELVIS W CONTRAST  Result Date: 06/12/2021 CLINICAL DATA:  Dizziness. Vomiting. Abdominal pain. Abnormal chest radiograph. EXAM: CT CHEST, ABDOMEN, AND PELVIS WITH CONTRAST TECHNIQUE: Multidetector CT imaging of the chest, abdomen and pelvis was performed following the standard protocol during bolus administration of intravenous contrast. CONTRAST:  59mL OMNIPAQUE IOHEXOL 350 MG/ML SOLN COMPARISON:  Chest radiograph from earlier today. 02/19/2017 CT abdomen/pelvis. FINDINGS: CT CHEST FINDINGS Cardiovascular: Normal heart size. No significant pericardial effusion/thickening. Mildly atherosclerotic nonaneurysmal thoracic aorta. Normal caliber pulmonary arteries. No central pulmonary emboli. Mediastinum/Nodes: Hypodense 1.0 cm right thyroid nodule. Unremarkable esophagus. Heterogeneously enhancing bilateral supraclavicular adenopathy measuring 2.1 cm short axis diameter on the left (series 2/image 7) and 1.4 cm on the right (series 2/image 8). No right axillary adenopathy. Enlarged heterogeneous 2.7 cm left axillary node (series 2/image 14). Enlarged 1.1 cm left retropectoral node (series 2/image 13). Enlarged heterogeneous bilateral prevascular mediastinal nodes, largest 2.0 cm on the left (series 2/image 19). Enlarged 2.9 cm AP window heterogeneous node (series 2/image 21). Bulky confluent heterogeneous right paratracheal adenopathy up to 3.0 cm (series 2/image 19). Enlarged heterogeneous 2.9 cm subcarinal node (series 2/image 25). Enlarged heterogeneous 2.6 cm right hilar node (series 2/image 21). No pathologically enlarged left hilar nodes. Lungs/Pleura: No pneumothorax. No pleural effusion. Obstructing central right upper lobe  lung mass with abrupt cut off of the right upper lobe bronchus, poorly delineated given complete right upper lobe collapse, approximately 6.0 x 5.6 cm (series 2/image 14). Predominantly solid 2.5 x 2.1 cm peripheral right lower lobe pulmonary nodule (series 4/image 76). Several (at least 8) additional subcentimeter solid pulmonary nodules in both lungs, largest 0.6 cm in the posterior right middle lobe (series 4/image 77). Musculoskeletal: No aggressive appearing focal osseous lesions. Marked thoracic spondylosis. CT ABDOMEN PELVIS FINDINGS Hepatobiliary: Normal liver with no liver mass. Cholecystectomy. No biliary ductal dilatation. Pancreas: Normal, with no mass or duct dilation. Spleen: Normal size. No mass. Adrenals/Urinary Tract: Normal adrenals. Nonobstructing 5 mm lower left renal stone. Subcentimeter hypodense bilateral renal cortical lesions are too small to characterize and require no follow-up. No hydronephrosis. Normal bladder. Stomach/Bowel: Small hiatal hernia. Otherwise normal nondistended stomach. Normal caliber small bowel with no small bowel wall thickening. Normal appendix. Mild left colonic diverticulosis with no large bowel wall thickening or significant pericolonic fat stranding. Vascular/Lymphatic: Atherosclerotic nonaneurysmal abdominal aorta. Patent portal, splenic,  hepatic and renal veins. No pathologically enlarged lymph nodes in the abdomen or pelvis. Reproductive: Status post hysterectomy, with no abnormal findings at the vaginal cuff. No adnexal mass. Other: No pneumoperitoneum, ascites or focal fluid collection. Musculoskeletal: No aggressive appearing focal osseous lesions. Moderate lumbar spondylosis. IMPRESSION: 1. Suspected obstructing central right upper lobe lung mass with abrupt cut off of the right upper lobe bronchus, poorly delineated given complete right upper lobe collapse, estimated at 6.0 cm size, highly suspicious for primary bronchogenic carcinoma. 2. Bilateral  supraclavicular, bilateral mediastinal, right hilar, left retropectoral and left axillary lymphadenopathy with heterogeneous enhancement, compatible with metastatic disease. 3. Predominantly solid 2.5 cm right lower lobe pulmonary nodule is suspicious for metastasis versus a second primary lung neoplasm. Several (at least 8) additional subcentimeter solid pulmonary nodules scattered in both lungs, suspect pulmonary metastases. 4. Multi disciplinary thoracic oncology consultation suggested. Consider PET-CT for further staging evaluation. 5. No evidence of metastatic disease in the abdomen or pelvis. 6. Small hiatal hernia. 7. Mild left colonic diverticulosis. 8. Nonobstructing left nephrolithiasis. 9. Aortic Atherosclerosis (ICD10-I70.0). Electronically Signed   By: Ilona Sorrel M.D.   On: 06/12/2021 13:00   NM PET Image Initial (PI) Skull Base To Thigh  Result Date: 06/22/2021 CLINICAL DATA:  Initial treatment strategy for lung nodules. EXAM: NUCLEAR MEDICINE PET SKULL BASE TO THIGH TECHNIQUE: 7.4 mCi F-18 FDG was injected intravenously. Full-ring PET imaging was performed from the skull base to thigh after the radiotracer. CT data was obtained and used for attenuation correction and anatomic localization. Fasting blood glucose: 94 mg/dl COMPARISON:  06/12/2021 FINDINGS: Mediastinal blood pool activity: SUV max 2.8 Liver activity: SUV max NA NECK: Abnormal hypoactivity in a mildly hyperdense 3.4 by 2.2 cm left cerebellar mass on image 4 series 3. 1.6 cm suprasellar mass. 1.0 cm right cerebellar mass on image 8 series 3. The patient has numerous additional known parenchymal masses involving the cerebrum, cerebellum, and brainstem based on the MRI of 06/12/2021, although most of these are too small for accurate PET-CT characterization. Incidental CT findings: none CHEST: Hypermetabolic right upper lobe mass partially surrounded by atelectatic lung, maximum SUV of the mass is 11.2. The dominant hypermetabolic  associated activity associated with this mass measures about 4.4 by 3.6 cm, but the mass is felt to be larger and extend further towards the right hilum based on the recent CT appearance on 06/12/2021. There is truncation and complete obstruction of the right upper lobe bronchus medial to this mass, possibly due to a less hypermetabolic component of the mass or adjacent adenopathy. The overall region of including the mass and surrounding atelectatic lung measures about 6.8 by 6.1 cm on image 62 series 3 and extends down to the left hilum and the truncated left upper lobe bronchus. Substantial bilateral supraclavicular, prevascular, paratracheal, AP window, subcarinal, right hilar, left axillary adenopathy. Index left supraclavicular node 1.9 cm in short axis on image 54 series 3 with maximum SUV 5.1. Index subcarinal node 2.5 cm in short axis on image 83 series 3, maximum SUV 3.5. Index left axillary lymph node 2.3 cm in short axis on image 82 series 3 with maximum SUV 5.0. Index AP window lymph node 2.7 cm in short axis on image 77 series 3 with maximum SUV 3.7. The 2.3 by 1.9 cm right lower lobe nodule on image 88 series 3 has maximum SUV of 1.4, and synchronous low-grade adenocarcinoma cannot be excluded. The other tiny pulmonary nodules in the right lung are too small to characterize.  Incidental CT findings: Mild aortic arch atherosclerotic calcification. Small type 1 hiatal hernia. Recently placed Port-A-Cath with some surrounding gas which is normal in the short-term postoperative setting, tip in the SVC. ABDOMEN/PELVIS: No significant abnormal hypermetabolic activity in this region. Incidental CT findings: Atherosclerosis is present, including aortoiliac atherosclerotic disease. Cholecystectomy. Nonobstructive left nephrolithiasis. Scattered colonic diverticula colonic diverticula. SKELETON: No significant abnormal hypermetabolic activity in this region. Incidental left antecubital activity is thought to be  injection related. Incidental CT findings: Spondylosis. IMPRESSION: 1. Dominant right upper lobe mass with truncation of the right upper lobe bronchus, with extensive associated thoracic adenopathy as well as intracranial metastatic lesions. 2. A 2.3 by 1.9 cm somewhat irregular right lower lobe nodule could represent a metastatic lesion or synchronous low-grade adenocarcinoma, but has only low-grade activity with maximum SUV of 1.4. The other tiny pulmonary nodules scattered in the right lung are too small to characterize. 3. No compelling findings of metastatic involvement of the abdomen/pelvis or visualized skeleton. 4. Other imaging findings of potential clinical significance: Aortic Atherosclerosis (ICD10-I70.0). Nonobstructive left nephrolithiasis. Scattered small type 1 hiatal hernia. Electronically Signed   By: Van Clines M.D.   On: 06/22/2021 07:36   DG Chest Portable 1 View  Result Date: 06/12/2021 CLINICAL DATA:  Infiltrate.  Dizziness.  Emesis. EXAM: PORTABLE CHEST 1 VIEW COMPARISON:  None. FINDINGS: Heart size is normal. Right upper lobe collapse noted. Opacity in the right upper hemithorax. Patchy opacity noted in the right lower lobe. The left lung is clear. Axial skeleton unremarkable. IMPRESSION: 1. Right upper lobe collapse and patchy right lower lobe airspace disease. This raises concern for underlying obstruction. Recommend CT of the chest with contrast for further evaluation. 2. The left lung is clear. These results were called by telephone at the time of interpretation on 06/12/2021 at 11:51 am to provider Merlyn Lot , who verbally acknowledged these results. Electronically Signed   By: San Morelle M.D.   On: 06/12/2021 11:54   [place] CORE BIOPSY (LYMPH NODES)  Result Date: 06/20/2021 INDICATION: Lung mass with lymphadenopathy EXAM: ULTRASOUND GUIDED CORE NEEDLE BIOPSY BIOPSY OF RIGHT SUPRACLAVICULAR LYMPH NODE MEDICATIONS: None. ANESTHESIA/SEDATION: None PROCEDURE: The  procedure, risks, benefits, and alternatives were explained to the patient. Questions regarding the procedure were encouraged and answered. The patient understands and consents to the procedure. The right supraclavicular region was prepped with chlorhexidine in a sterile fashion, and a sterile drape was applied covering the operative field. A sterile gown and sterile gloves were used for the procedure. Local anesthesia was provided with 1% Lidocaine. Utilizing continuous ultrasound guidance, 6-18 gauge cores were obtained from the enlarged right supraclavicular lymph node. All samples were sent to pathology in sterile saline. COMPLICATIONS: None immediate. IMPRESSION: Ultrasound-guided biopsy of enlarged right supraclavicular lymph node. Electronically Signed   By: Miachel Roux M.D.   On: 06/20/2021 11:54   IR IMAGING GUIDED PORT INSERTION  Result Date: 06/20/2021 INDICATION: Lung mass with metastatic lymphadenopathy EXAM: IMPLANTED PORT A CATH PLACEMENT WITH ULTRASOUND AND FLUOROSCOPIC GUIDANCE MEDICATIONS: None ANESTHESIA/SEDATION: Moderate (conscious) sedation was employed during this procedure. A total of Versed 3 mg and Fentanyl 100 mcg was administered intravenously. Moderate Sedation Time: 27 minutes. The patient's level of consciousness and vital signs were monitored continuously by radiology nursing throughout the procedure under my direct supervision. FLUOROSCOPY TIME:  Two minutes, 48 seconds (15 mGy) COMPLICATIONS: None immediate. PROCEDURE: The procedure, risks, benefits, and alternatives were explained to the patient. Questions regarding the procedure were encouraged and answered. The  patient understands and consents to the procedure. A timeout was performed prior to the initiation of the procedure. Patient positioned supine on the angiography table. Left neck and anterior upper chest prepped and draped in the usual sterile fashion. All elements of maximal sterile barrier were utilized including,  cap, mask, sterile gown, sterile gloves, large sterile drape, hand scrubbing and 2% Chlorhexidine for skin cleaning. The left internal jugular vein was evaluated with ultrasound and shown to be patent. A permanent ultrasound image was obtained and placed in the patient's medical record. Local anesthesia was provided with 1% lidocaine with epinephrine. Using sterile gel and a sterile probe cover, the left internal jugular vein was entered with a 21 ga needle during real time ultrasound guidance. 0.018 inch guidewire placed and 21 ga needle exchanged for transitional dilator set. Utilizing fluoroscopy, 0.035 inch guidewire advanced through the needle without difficulty. Attention then turned to the left anterior upper chest. Following local lidocaine administration, a port pocket was created. The catheter was connected to the port and brought from the pocket to the venotomy site through a subcutaneous tunnel. The catheter was cut to size and inserted through the peel-away sheath. The catheter tip was positioned at the cavoatrial junction using fluoroscopic guidance. The port aspirated and flushed well. The port pocket was closed with deep and superficial absorbable suture. The port pocket incision and venotomy sites were also sealed with Dermabond. IMPRESSION: Successful placement of a left internal jugular approach power injectable Port-A-Cath. The catheter is ready for immediate use. Electronically Signed   By: Miachel Roux M.D.   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  PLAN:    Stage IVa adenocarcinoma of the right upper lobe lung with innumerable brain metastasis: Patient initiated whole brain XRT last week and is tolerating treatment well.  She has approximately 1 week left of treatment.  Patient has had port placement.  PET scan results from June 21, 2021 reviewed independently in part as above with multiple lung  metastasis, but no evidence of systemic disease other than patient's known lesions in her brain.  Molecular studies have been sent and are pending at time of dictation.  Plan to initiate chemotherapy with carboplatinum AUC 5, Taxol 175 mg per metered squared, and Avastin 15 mg/kg unless molecular studies indicate alternative treatment.  Patient also require Udenyca support.  Return to clinic in 1 week for consideration of cycle 1 of 4.  Plan to reimage with PET scan at the conclusion of cycle 4. Brain metastasis: Continue Decadron and XRT. Nausea, poor appetite: Continue Decadron 4 mg twice daily and ondansetron ODT.  Patient also return to clinic later this week for IV fluids and IV antiemetics. Hypokalemia: Patient received IV potassium yesterday. Leukocytosis: Likely secondary to Decadron, monitor. Elevated liver enzymes: Mild, monitor.  Patient expressed understanding and was in agreement with this plan. She also understands that She can call clinic at any time with any questions, concerns, or complaints.   Cancer Staging Primary adenocarcinoma of upper lobe of right lung [facility]) Staging form: Lung, AJCC 8th Edition - Clinical stage from 06/27/2021: Stage IVA (cT3, cN3, pM1b) - Signed by Lloyd Huger, MD on 06/27/2021 Stage prefix: Initial diagnosis  Lloyd Huger, MD   06/27/2021 7:06 PM

## 2021-06-26 ENCOUNTER — Inpatient Hospital Stay: Payer: Medicare Other

## 2021-06-26 ENCOUNTER — Ambulatory Visit: Payer: Medicare Other

## 2021-06-26 ENCOUNTER — Other Ambulatory Visit: Payer: Self-pay

## 2021-06-26 ENCOUNTER — Inpatient Hospital Stay (HOSPITAL_BASED_OUTPATIENT_CLINIC_OR_DEPARTMENT_OTHER): Payer: Medicare Other | Admitting: Hospice and Palliative Medicine

## 2021-06-26 ENCOUNTER — Ambulatory Visit: Admission: RE | Admit: 2021-06-26 | Payer: Medicare Other | Source: Ambulatory Visit

## 2021-06-26 ENCOUNTER — Inpatient Hospital Stay: Payer: Medicare Other | Attending: Hospice and Palliative Medicine

## 2021-06-26 VITALS — BP 122/81 | HR 103 | Temp 98.8°F | Resp 18

## 2021-06-26 DIAGNOSIS — M47812 Spondylosis without myelopathy or radiculopathy, cervical region: Secondary | ICD-10-CM | POA: Insufficient documentation

## 2021-06-26 DIAGNOSIS — R110 Nausea: Secondary | ICD-10-CM

## 2021-06-26 DIAGNOSIS — K5900 Constipation, unspecified: Secondary | ICD-10-CM | POA: Diagnosis not present

## 2021-06-26 DIAGNOSIS — D72829 Elevated white blood cell count, unspecified: Secondary | ICD-10-CM | POA: Insufficient documentation

## 2021-06-26 DIAGNOSIS — R531 Weakness: Secondary | ICD-10-CM | POA: Insufficient documentation

## 2021-06-26 DIAGNOSIS — Z5189 Encounter for other specified aftercare: Secondary | ICD-10-CM | POA: Diagnosis not present

## 2021-06-26 DIAGNOSIS — R140 Abdominal distension (gaseous): Secondary | ICD-10-CM | POA: Insufficient documentation

## 2021-06-26 DIAGNOSIS — C3411 Malignant neoplasm of upper lobe, right bronchus or lung: Secondary | ICD-10-CM | POA: Diagnosis present

## 2021-06-26 DIAGNOSIS — R42 Dizziness and giddiness: Secondary | ICD-10-CM | POA: Diagnosis not present

## 2021-06-26 DIAGNOSIS — Z800 Family history of malignant neoplasm of digestive organs: Secondary | ICD-10-CM | POA: Insufficient documentation

## 2021-06-26 DIAGNOSIS — C7931 Secondary malignant neoplasm of brain: Secondary | ICD-10-CM | POA: Insufficient documentation

## 2021-06-26 DIAGNOSIS — Z79899 Other long term (current) drug therapy: Secondary | ICD-10-CM | POA: Insufficient documentation

## 2021-06-26 DIAGNOSIS — R5383 Other fatigue: Secondary | ICD-10-CM | POA: Insufficient documentation

## 2021-06-26 DIAGNOSIS — Z8719 Personal history of other diseases of the digestive system: Secondary | ICD-10-CM | POA: Insufficient documentation

## 2021-06-26 DIAGNOSIS — R918 Other nonspecific abnormal finding of lung field: Secondary | ICD-10-CM | POA: Diagnosis not present

## 2021-06-26 DIAGNOSIS — J9819 Other pulmonary collapse: Secondary | ICD-10-CM | POA: Diagnosis not present

## 2021-06-26 DIAGNOSIS — K5730 Diverticulosis of large intestine without perforation or abscess without bleeding: Secondary | ICD-10-CM | POA: Diagnosis not present

## 2021-06-26 DIAGNOSIS — Z801 Family history of malignant neoplasm of trachea, bronchus and lung: Secondary | ICD-10-CM | POA: Insufficient documentation

## 2021-06-26 DIAGNOSIS — E876 Hypokalemia: Secondary | ICD-10-CM

## 2021-06-26 DIAGNOSIS — M47814 Spondylosis without myelopathy or radiculopathy, thoracic region: Secondary | ICD-10-CM | POA: Diagnosis not present

## 2021-06-26 DIAGNOSIS — Z5112 Encounter for antineoplastic immunotherapy: Secondary | ICD-10-CM | POA: Diagnosis not present

## 2021-06-26 DIAGNOSIS — Z923 Personal history of irradiation: Secondary | ICD-10-CM | POA: Insufficient documentation

## 2021-06-26 DIAGNOSIS — M47816 Spondylosis without myelopathy or radiculopathy, lumbar region: Secondary | ICD-10-CM | POA: Diagnosis not present

## 2021-06-26 DIAGNOSIS — E041 Nontoxic single thyroid nodule: Secondary | ICD-10-CM | POA: Insufficient documentation

## 2021-06-26 DIAGNOSIS — I700 Atherosclerosis of aorta: Secondary | ICD-10-CM | POA: Diagnosis not present

## 2021-06-26 DIAGNOSIS — N200 Calculus of kidney: Secondary | ICD-10-CM | POA: Diagnosis not present

## 2021-06-26 DIAGNOSIS — R10819 Abdominal tenderness, unspecified site: Secondary | ICD-10-CM | POA: Diagnosis not present

## 2021-06-26 DIAGNOSIS — Z87891 Personal history of nicotine dependence: Secondary | ICD-10-CM | POA: Insufficient documentation

## 2021-06-26 DIAGNOSIS — Z8042 Family history of malignant neoplasm of prostate: Secondary | ICD-10-CM | POA: Insufficient documentation

## 2021-06-26 DIAGNOSIS — R109 Unspecified abdominal pain: Secondary | ICD-10-CM | POA: Insufficient documentation

## 2021-06-26 DIAGNOSIS — K449 Diaphragmatic hernia without obstruction or gangrene: Secondary | ICD-10-CM | POA: Insufficient documentation

## 2021-06-26 DIAGNOSIS — R630 Anorexia: Secondary | ICD-10-CM | POA: Insufficient documentation

## 2021-06-26 DIAGNOSIS — I6782 Cerebral ischemia: Secondary | ICD-10-CM | POA: Insufficient documentation

## 2021-06-26 DIAGNOSIS — Z5111 Encounter for antineoplastic chemotherapy: Secondary | ICD-10-CM | POA: Insufficient documentation

## 2021-06-26 DIAGNOSIS — Z9049 Acquired absence of other specified parts of digestive tract: Secondary | ICD-10-CM | POA: Insufficient documentation

## 2021-06-26 DIAGNOSIS — Z803 Family history of malignant neoplasm of breast: Secondary | ICD-10-CM | POA: Insufficient documentation

## 2021-06-26 LAB — COMPREHENSIVE METABOLIC PANEL
ALT: 168 U/L — ABNORMAL HIGH (ref 0–44)
AST: 64 U/L — ABNORMAL HIGH (ref 15–41)
Albumin: 3.9 g/dL (ref 3.5–5.0)
Alkaline Phosphatase: 94 U/L (ref 38–126)
Anion gap: 14 (ref 5–15)
BUN: 23 mg/dL (ref 8–23)
CO2: 32 mmol/L (ref 22–32)
Calcium: 9 mg/dL (ref 8.9–10.3)
Chloride: 95 mmol/L — ABNORMAL LOW (ref 98–111)
Creatinine, Ser: 0.98 mg/dL (ref 0.44–1.00)
GFR, Estimated: >60 mL/min (ref >60)
Glucose, Bld: 114 mg/dL — ABNORMAL HIGH (ref 70–99)
Potassium: 2.9 mmol/L — ABNORMAL LOW (ref 3.5–5.1)
Sodium: 141 mmol/L (ref 135–145)
Total Bilirubin: 0.9 mg/dL (ref 0.3–1.2)
Total Protein: 7.7 g/dL (ref 6.5–8.1)

## 2021-06-26 LAB — CBC WITH DIFFERENTIAL/PLATELET
Abs Immature Granulocytes: 0.17 10*3/uL — ABNORMAL HIGH (ref 0.00–0.07)
Basophils Absolute: 0 10*3/uL (ref 0.0–0.1)
Basophils Relative: 0 %
Eosinophils Absolute: 0.3 10*3/uL (ref 0.0–0.5)
Eosinophils Relative: 2 %
HCT: 50.2 % — ABNORMAL HIGH (ref 36.0–46.0)
Hemoglobin: 16.2 g/dL — ABNORMAL HIGH (ref 12.0–15.0)
Immature Granulocytes: 1 %
Lymphocytes Relative: 18 %
Lymphs Abs: 2.7 10*3/uL (ref 0.7–4.0)
MCH: 27.6 pg (ref 26.0–34.0)
MCHC: 32.3 g/dL (ref 30.0–36.0)
MCV: 85.7 fL (ref 80.0–100.0)
Monocytes Absolute: 1.5 10*3/uL — ABNORMAL HIGH (ref 0.1–1.0)
Monocytes Relative: 10 %
Neutro Abs: 10.1 10*3/uL — ABNORMAL HIGH (ref 1.7–7.7)
Neutrophils Relative %: 69 %
Platelets: 257 10*3/uL (ref 150–400)
RBC: 5.86 MIL/uL — ABNORMAL HIGH (ref 3.87–5.11)
RDW: 14.5 % (ref 11.5–15.5)
WBC: 14.8 10*3/uL — ABNORMAL HIGH (ref 4.0–10.5)
nRBC: 0 % (ref 0.0–0.2)

## 2021-06-26 MED ORDER — DEXAMETHASONE SODIUM PHOSPHATE 10 MG/ML IJ SOLN
10.0000 mg | Freq: Once | INTRAMUSCULAR | Status: AC
  Administered 2021-06-26: 10 mg via INTRAVENOUS
  Filled 2021-06-26: qty 1

## 2021-06-26 MED ORDER — PROCHLORPERAZINE MALEATE 10 MG PO TABS: 10.0000 mg | ORAL_TABLET | Freq: Four times a day (QID) | ORAL | 0 refills | Status: DC | PRN

## 2021-06-26 MED ORDER — ONDANSETRON HCL 4 MG/2ML IJ SOLN
4.0000 mg | Freq: Once | INTRAMUSCULAR | Status: AC
  Administered 2021-06-26: 4 mg via INTRAVENOUS

## 2021-06-26 MED ORDER — POTASSIUM CHLORIDE 10 MEQ/100ML IV SOLN
10.0000 meq | Freq: Once | INTRAVENOUS | Status: AC
  Administered 2021-06-26: 10 meq via INTRAVENOUS
  Filled 2021-06-26: qty 100

## 2021-06-26 MED ORDER — ONDANSETRON 8 MG PO TBDP
8.0000 mg | ORAL_TABLET | Freq: Three times a day (TID) | ORAL | 0 refills | Status: DC | PRN
Start: 2021-06-26 — End: 2021-07-15

## 2021-06-26 MED ORDER — SODIUM CHLORIDE 0.9 % IV SOLN
INTRAVENOUS | Status: DC
  Filled 2021-06-26 (×2): qty 250

## 2021-06-26 MED ORDER — SODIUM CHLORIDE 0.9 % IV SOLN: 10.0000 mg | Freq: Once | INTRAVENOUS | Status: DC

## 2021-06-26 MED ORDER — POTASSIUM CHLORIDE 20 MEQ/15ML (10%) PO SOLN: 20.0000 meq | Freq: Two times a day (BID) | ORAL | 0 refills | Status: DC

## 2021-06-26 MED ORDER — ONDANSETRON HCL 4 MG/2ML IJ SOLN
4.0000 mg | Freq: Once | INTRAMUSCULAR | Status: DC
  Filled 2021-06-26: qty 2

## 2021-06-26 MED ORDER — SODIUM CHLORIDE 0.9 % IV SOLN
INTRAVENOUS | Status: DC
  Filled 2021-06-26: qty 250

## 2021-06-26 MED ORDER — ONDANSETRON HCL 4 MG/2ML IJ SOLN
INTRAMUSCULAR | Status: AC
  Filled 2021-06-26: qty 2

## 2021-06-26 MED ORDER — LIDOCAINE-PRILOCAINE 2.5-2.5 % EX CREA: 1.0000 "application " | TOPICAL_CREAM | CUTANEOUS | 0 refills | Status: DC | PRN

## 2021-06-26 NOTE — Progress Notes (Signed)
Attempted to access port. Swelling noted to site. No blood return noted. Port access attempted by 2nd Therapist, sports. Again, no blood return. Pt verbalized discomfort with this, so procedure was terminated. Port deaccessed and peripheral IV placed in right [facility].

## 2021-06-26 NOTE — Patient Instructions (Signed)

## 2021-06-26 NOTE — Progress Notes (Signed)
Symptom Management [place]  [phone] [phone]  Patient Care Team: Marinda Elk, MD as PCP - General (Physician Assistant) Christene Lye, MD as Consulting Physician (General Surgery) Ellamae Sia, MD (Inactive) as Referring Physician (Internal Medicine) Telford Nab, RN as Oncology Nurse Navigator   Name of the patient: Sandra Jordan  [id number]  [birth year]   Date of visit: 06/26/21  Reason for Consult:  Ms. Sandra Jordan is a 70 year old woman with multiple medical problems who was hospitalized 06/12/2021 to 06/13/2021 with progressive weakness, nausea, and vomiting.  She was sent to the ER by her PCP due to hypokalemia.  Unfortunately, work-up revealed widely metastatic disease with innumerable brain metastases and vasogenic edema on CT of the brain.  CT of the chest, abdomen, and pelvis revealed an obstructing central right upper lobe mass, bilateral supraclavicular/mediastinal/retropectoral and left axillary lymphadenopathy, and a right lower lobe nodule concerning for metastatic lung cancer.  Patient has been receiving whole brain radiation.  She was referred to [facility] by radiation oncology for concern of persistent nausea.  Symptoms improved with steroids and antiemetics.    Patient presents to [facility] today with persistent nausea and unable to keep down oral food/fluids over the past several days.  She has been unable to take her dexamethasone and did not have any antiemetics prescribed.  Patient denies fever or chills.  No abdominal pain.  She does endorse some constipation has been several days since she last had a bowel movement.  That she is passing gas and has no abdominal distention.  Denies any neurologic complaints. Denies recent fevers or illnesses. Denies any easy bleeding or bruising.  Denies chest pain. Denies urinary complaints. Patient offers no further specific complaints today.  PAST MEDICAL  HISTORY: Past Medical History:  Diagnosis Date   Allergy    Colon polyp    Hyperlipidemia    Hypertension     PAST SURGICAL HISTORY:  Past Surgical History:  Procedure Laterality Date   ABDOMINAL HYSTERECTOMY  1984   BREAST BIOPSY Bilateral 1990   fibrocystic breast   BREAST BIOPSY Right August 2014   fibrocystic breast   CHOLECYSTECTOMY  2008   COLONOSCOPY  2012, 2017   [facility]   IR IMAGING GUIDED PORT INSERTION  06/20/2021    HEMATOLOGY/ONCOLOGY HISTORY:  Oncology History   No history exists.    ALLERGIES:  has No Known Allergies.  MEDICATIONS:  Current Outpatient Medications  Medication Sig Dispense Refill   cetirizine (ZYRTEC) 10 MG tablet Take 10 mg by mouth daily.     cyanocobalamin 1000 MCG tablet Take by mouth.     dexamethasone (DECADRON) 4 MG tablet Take 1 tablet (4 mg total) by mouth 2 (two) times daily with a meal. 60 tablet 1   fluticasone (FLONASE) 50 MCG/ACT nasal spray Place into both nostrils.     hydrALAZINE (APRESOLINE) 25 MG tablet Take 25 mg by mouth 2 (two) times daily.     hydrochlorothiazide (HYDRODIURIL) 25 MG tablet Take 25 mg by mouth daily.     ibuprofen (ADVIL) 800 MG tablet Take 800 mg by mouth every 6 (six) hours as needed.     lovastatin (MEVACOR) 20 MG tablet Take 20 mg by mouth at bedtime.     lovastatin (MEVACOR) 40 MG tablet SMARTSIG:1 Tablet(s) By Mouth Every Evening     meloxicam (MOBIC) 7.5 MG tablet Take 7.5 mg by mouth daily.     potassium chloride (KLOR-CON) 10 MEQ tablet Take 10  mEq by mouth daily.     No current facility-administered medications for this visit.    VITAL SIGNS: BP 122/81   Pulse (!) 103   Temp 98.8 F (37.1 C) (Oral)   Resp 18   SpO2 97%  There were no vitals filed for this visit.  Estimated body mass index is 24.59 kg/m as calculated from the following:   Height as of 06/20/21: 5' 3.5" (1.613 m).   Weight as of 06/20/21: 141 lb (64 kg).  LABS: CBC:    Component Value Date/Time   WBC 8.1  06/12/2021 1056   HGB 14.6 06/12/2021 1056   HGB 12.4 01/18/2014 1243   HCT 43.4 06/12/2021 1056   HCT 38.6 01/18/2014 1243   PLT 352 06/12/2021 1056   PLT 287 01/18/2014 1243   MCV 84.3 06/12/2021 1056   MCV 84 01/18/2014 1243   NEUTROABS 2.9 01/18/2014 1243   LYMPHSABS 2.2 01/18/2014 1243   MONOABS 0.6 01/18/2014 1243   EOSABS 0.2 01/18/2014 1243   BASOSABS 0.1 01/18/2014 1243   Comprehensive Metabolic Panel:    Component Value Date/Time   NA 144 06/13/2021 0446   NA 139 01/26/2013 1238   K 4.9 06/13/2021 0446   K 3.3 (L) 01/26/2013 1238   CL 110 06/13/2021 0446   CL 98 01/26/2013 1238   CO2 27 06/13/2021 0446   CO2 32 01/26/2013 1238   BUN 8 06/13/2021 0446   BUN 13 01/26/2013 1238   CREATININE 0.73 06/13/2021 0446   CREATININE 0.75 01/26/2013 1238   GLUCOSE 126 (H) 06/13/2021 0446   GLUCOSE 92 01/26/2013 1238   CALCIUM 10.0 06/13/2021 0446   CALCIUM 9.5 01/26/2013 1238   AST 36 06/12/2021 1056   ALT 41 06/12/2021 1056   ALKPHOS 100 06/12/2021 1056   BILITOT 0.9 06/12/2021 1056   PROT 7.7 06/12/2021 1056   ALBUMIN 4.0 06/12/2021 1056    RADIOGRAPHIC STUDIES: CT HEAD WO CONTRAST (5MM)  Result Date: 06/12/2021 CLINICAL DATA:  Dizziness. EXAM: CT HEAD WITHOUT CONTRAST TECHNIQUE: Contiguous axial images were obtained from the base of the skull through the vertex without intravenous contrast. COMPARISON:  None. FINDINGS: Brain: Multiple mass like lesions are present with surrounding edema. Hyperdense mass in the left frontal lobe measures 18 x 18 x 17 mm. A right occipital lesion measures 13 x 11 x 15 mm with surrounding vasogenic edema mass effect. A lesion in the left cerebellum just below the tentorium measures 17 x 13 x 9 mm with vasogenic edema and mass effect in the left hemisphere. There is some mass effect on the fourth ventricle without obstruction. A left frontal lobe lesion near the vertex measures up to 12 mm. There is some surrounding edema. Hyperdense  sellar/suprasellar mass measures 26 x 17 x 18 mm. This creates mass effect on the optic chiasm. Ventricles are of normal size. No significant extraaxial fluid collection is present. Vascular: No hyperdense vessel or unexpected calcification. Skull: Calvarium is intact. No focal lytic or blastic lesions are present. No significant extracranial soft tissue lesion is present. Sinuses/Orbits: The paranasal sinuses and mastoid air cells are clear. The globes and orbits are within normal limits. IMPRESSION: 1. Multiple mass like lesions with surrounding edema and mass effect in the brain, as described. The largest lesion is in the left frontal lobe measuring 18 x 18 x 17 mm. The findings are concerning for metastatic disease to the brain. MRI of the brain without and with contrast is recommended for further evaluation. 2. Hyperdense  sellar/suprasellar mass measures 26 x 17 x 18 mm. This creates mass effect on the optic chiasm. This could represent a pituitary macroadenoma. Additional metastatic lesion also considered. Electronically Signed   By: San Morelle M.D.   On: 06/12/2021 12:09   MR Brain W and Wo Contrast  Result Date: 06/12/2021 CLINICAL DATA:  Brain mass or lesion. EXAM: MRI HEAD WITHOUT AND WITH CONTRAST TECHNIQUE: Multiplanar, multiecho pulse sequences of the brain and surrounding structures were obtained without and with intravenous contrast. CONTRAST:  16mL MAGNEVIST GADOPENTETATE DIMEGLUMINE 469.01 MG/ML IV SOLN COMPARISON:  Head CT performed earlier today 06/12/2021. FINDINGS: Brain: Cerebral volume is normal. There are numerable enhancing parenchymal metastases within the supratentorial brain, brainstem and bilateral cerebellar hemispheres compatible with intracranial metastatic disease. The 2 largest supratentorial lesions measure 2.0 x 1.9 x 2.0 cm within the mid left frontal lobe/left frontal operculum (series 19, image 95) and 2.5 x 2.0 x 2.7 cm within the right occipital lobe (for instance  as seen on series 19, image 70). Additionally, there is an enhancing mass centered in the region of the anteroinferior third ventricle, hypothalamus and suprasellar region measuring 1.5 x 1.9 x 2.2 cm. This mass is located posterior to the optic chiasm, anteriorly displacing and exerting mass effect upon the optic chiasm. This lesion also encroaches upon the anteroinferior aspect of the third ventricle. The largest infratentorial lesion is located within the left cerebellum and measures 4.0 x 3.4 x 2.9 cm. There is mild edema surrounding many of these lesions. Additionally, many of these lesions demonstrate internal SWI signal loss which may reflect hemorrhage and/or mineralization. Local mass effect associated with the largest supratentorial lesions. No midline shift. Significant mass effect associated with the dominant left cerebellar lesion with rightward deviation and partial effacement of the fourth ventricle. No evidence of hydrocephalus at this time. Background mild multifocal T2/FLAIR hyperintensity within the cerebral white matter, nonspecific but compatible with chronic small vessel ischemic disease. There is no acute infarct. No extra-axial fluid collection. Vascular: Maintained flow voids within the proximal large arterial vessels. Skull and upper cervical spine: No focal suspicious marrow lesion. Incompletely assessed cervical spondylosis. Sinuses/Orbits: Visualized orbits show no acute finding. No significant paranasal sinus disease. Other: Nonspecific 10 mm lesion associated with the left external ear. IMPRESSION: Innumerable intracranial parenchymal metastases within the supratentorial and infratentorial brain, as described. Measurements for the largest supratentorial and infratentorial lesions have been provided. Additional 2.2 x 1.9 cm enhancing mass centered in the region of the anteroinferior third ventricle, hypothalamus and suprasellar region which is also favored to reflect a metastasis. This  mass is located posterior to the optic chiasm, anteriorly displacing and exerting mass effect upon the optic chiasm. This lesion also encroaches upon the anteroinferior aspect of the third ventricle. There is mild edema surrounding many of the lesions. Additionally, there is SWI signal loss associated with many of the lesions which may reflect mineralization and/or hemorrhage. Mass effect associated with the dominant left cerebellar lesion with rightward deviation and partial effacement of the fourth ventricle. No evidence of hydrocephalus at this time. Background mild chronic small vessel ischemic changes within cerebral white matter. Nonspecific 10 mm round lesion associated with the left external ear. Direct visualization recommended. Electronically Signed   By: Kellie Simmering D.O.   On: 06/12/2021 14:35   CT CHEST ABDOMEN PELVIS W CONTRAST  Result Date: 06/12/2021 CLINICAL DATA:  Dizziness. Vomiting. Abdominal pain. Abnormal chest radiograph. EXAM: CT CHEST, ABDOMEN, AND PELVIS WITH CONTRAST TECHNIQUE: Multidetector CT imaging  of the chest, abdomen and pelvis was performed following the standard protocol during bolus administration of intravenous contrast. CONTRAST:  70mL OMNIPAQUE IOHEXOL 350 MG/ML SOLN COMPARISON:  Chest radiograph from earlier today. 02/19/2017 CT abdomen/pelvis. FINDINGS: CT CHEST FINDINGS Cardiovascular: Normal heart size. No significant pericardial effusion/thickening. Mildly atherosclerotic nonaneurysmal thoracic aorta. Normal caliber pulmonary arteries. No central pulmonary emboli. Mediastinum/Nodes: Hypodense 1.0 cm right thyroid nodule. Unremarkable esophagus. Heterogeneously enhancing bilateral supraclavicular adenopathy measuring 2.1 cm short axis diameter on the left (series 2/image 7) and 1.4 cm on the right (series 2/image 8). No right axillary adenopathy. Enlarged heterogeneous 2.7 cm left axillary node (series 2/image 14). Enlarged 1.1 cm left retropectoral node (series 2/image  13). Enlarged heterogeneous bilateral prevascular mediastinal nodes, largest 2.0 cm on the left (series 2/image 19). Enlarged 2.9 cm AP window heterogeneous node (series 2/image 21). Bulky confluent heterogeneous right paratracheal adenopathy up to 3.0 cm (series 2/image 19). Enlarged heterogeneous 2.9 cm subcarinal node (series 2/image 25). Enlarged heterogeneous 2.6 cm right hilar node (series 2/image 21). No pathologically enlarged left hilar nodes. Lungs/Pleura: No pneumothorax. No pleural effusion. Obstructing central right upper lobe lung mass with abrupt cut off of the right upper lobe bronchus, poorly delineated given complete right upper lobe collapse, approximately 6.0 x 5.6 cm (series 2/image 14). Predominantly solid 2.5 x 2.1 cm peripheral right lower lobe pulmonary nodule (series 4/image 76). Several (at least 8) additional subcentimeter solid pulmonary nodules in both lungs, largest 0.6 cm in the posterior right middle lobe (series 4/image 77). Musculoskeletal: No aggressive appearing focal osseous lesions. Marked thoracic spondylosis. CT ABDOMEN PELVIS FINDINGS Hepatobiliary: Normal liver with no liver mass. Cholecystectomy. No biliary ductal dilatation. Pancreas: Normal, with no mass or duct dilation. Spleen: Normal size. No mass. Adrenals/Urinary Tract: Normal adrenals. Nonobstructing 5 mm lower left renal stone. Subcentimeter hypodense bilateral renal cortical lesions are too small to characterize and require no follow-up. No hydronephrosis. Normal bladder. Stomach/Bowel: Small hiatal hernia. Otherwise normal nondistended stomach. Normal caliber small bowel with no small bowel wall thickening. Normal appendix. Mild left colonic diverticulosis with no large bowel wall thickening or significant pericolonic fat stranding. Vascular/Lymphatic: Atherosclerotic nonaneurysmal abdominal aorta. Patent portal, splenic, hepatic and renal veins. No pathologically enlarged lymph nodes in the abdomen or pelvis.  Reproductive: Status post hysterectomy, with no abnormal findings at the vaginal cuff. No adnexal mass. Other: No pneumoperitoneum, ascites or focal fluid collection. Musculoskeletal: No aggressive appearing focal osseous lesions. Moderate lumbar spondylosis. IMPRESSION: 1. Suspected obstructing central right upper lobe lung mass with abrupt cut off of the right upper lobe bronchus, poorly delineated given complete right upper lobe collapse, estimated at 6.0 cm size, highly suspicious for primary bronchogenic carcinoma. 2. Bilateral supraclavicular, bilateral mediastinal, right hilar, left retropectoral and left axillary lymphadenopathy with heterogeneous enhancement, compatible with metastatic disease. 3. Predominantly solid 2.5 cm right lower lobe pulmonary nodule is suspicious for metastasis versus a second primary lung neoplasm. Several (at least 8) additional subcentimeter solid pulmonary nodules scattered in both lungs, suspect pulmonary metastases. 4. Multi disciplinary thoracic oncology consultation suggested. Consider PET-CT for further staging evaluation. 5. No evidence of metastatic disease in the abdomen or pelvis. 6. Small hiatal hernia. 7. Mild left colonic diverticulosis. 8. Nonobstructing left nephrolithiasis. 9. Aortic Atherosclerosis (ICD10-I70.0). Electronically Signed   By: Ilona Sorrel M.D.   On: 06/12/2021 13:00   NM PET Image Initial (PI) Skull Base To Thigh  Result Date: 06/22/2021 CLINICAL DATA:  Initial treatment strategy for lung nodules. EXAM: NUCLEAR MEDICINE PET SKULL  BASE TO THIGH TECHNIQUE: 7.4 mCi F-18 FDG was injected intravenously. Full-ring PET imaging was performed from the skull base to thigh after the radiotracer. CT data was obtained and used for attenuation correction and anatomic localization. Fasting blood glucose: 94 mg/dl COMPARISON:  06/12/2021 FINDINGS: Mediastinal blood pool activity: SUV max 2.8 Liver activity: SUV max NA NECK: Abnormal hypoactivity in a mildly  hyperdense 3.4 by 2.2 cm left cerebellar mass on image 4 series 3. 1.6 cm suprasellar mass. 1.0 cm right cerebellar mass on image 8 series 3. The patient has numerous additional known parenchymal masses involving the cerebrum, cerebellum, and brainstem based on the MRI of 06/12/2021, although most of these are too small for accurate PET-CT characterization. Incidental CT findings: none CHEST: Hypermetabolic right upper lobe mass partially surrounded by atelectatic lung, maximum SUV of the mass is 11.2. The dominant hypermetabolic associated activity associated with this mass measures about 4.4 by 3.6 cm, but the mass is felt to be larger and extend further towards the right hilum based on the recent CT appearance on 06/12/2021. There is truncation and complete obstruction of the right upper lobe bronchus medial to this mass, possibly due to a less hypermetabolic component of the mass or adjacent adenopathy. The overall region of including the mass and surrounding atelectatic lung measures about 6.8 by 6.1 cm on image 62 series 3 and extends down to the left hilum and the truncated left upper lobe bronchus. Substantial bilateral supraclavicular, prevascular, paratracheal, AP window, subcarinal, right hilar, left axillary adenopathy. Index left supraclavicular node 1.9 cm in short axis on image 54 series 3 with maximum SUV 5.1. Index subcarinal node 2.5 cm in short axis on image 83 series 3, maximum SUV 3.5. Index left axillary lymph node 2.3 cm in short axis on image 82 series 3 with maximum SUV 5.0. Index AP window lymph node 2.7 cm in short axis on image 77 series 3 with maximum SUV 3.7. The 2.3 by 1.9 cm right lower lobe nodule on image 88 series 3 has maximum SUV of 1.4, and synchronous low-grade adenocarcinoma cannot be excluded. The other tiny pulmonary nodules in the right lung are too small to characterize. Incidental CT findings: Mild aortic arch atherosclerotic calcification. Small type 1 hiatal hernia.  Recently placed Port-A-Cath with some surrounding gas which is normal in the short-term postoperative setting, tip in the SVC. ABDOMEN/PELVIS: No significant abnormal hypermetabolic activity in this region. Incidental CT findings: Atherosclerosis is present, including aortoiliac atherosclerotic disease. Cholecystectomy. Nonobstructive left nephrolithiasis. Scattered colonic diverticula colonic diverticula. SKELETON: No significant abnormal hypermetabolic activity in this region. Incidental left antecubital activity is thought to be injection related. Incidental CT findings: Spondylosis. IMPRESSION: 1. Dominant right upper lobe mass with truncation of the right upper lobe bronchus, with extensive associated thoracic adenopathy as well as intracranial metastatic lesions. 2. A 2.3 by 1.9 cm somewhat irregular right lower lobe nodule could represent a metastatic lesion or synchronous low-grade adenocarcinoma, but has only low-grade activity with maximum SUV of 1.4. The other tiny pulmonary nodules scattered in the right lung are too small to characterize. 3. No compelling findings of metastatic involvement of the abdomen/pelvis or visualized skeleton. 4. Other imaging findings of potential clinical significance: Aortic Atherosclerosis (ICD10-I70.0). Nonobstructive left nephrolithiasis. Scattered small type 1 hiatal hernia. Electronically Signed   By: Van Clines M.D.   On: 06/22/2021 07:36   DG Chest Portable 1 View  Result Date: 06/12/2021 CLINICAL DATA:  Infiltrate.  Dizziness.  Emesis. EXAM: PORTABLE CHEST 1 VIEW  COMPARISON:  None. FINDINGS: Heart size is normal. Right upper lobe collapse noted. Opacity in the right upper hemithorax. Patchy opacity noted in the right lower lobe. The left lung is clear. Axial skeleton unremarkable. IMPRESSION: 1. Right upper lobe collapse and patchy right lower lobe airspace disease. This raises concern for underlying obstruction. Recommend CT of the chest with contrast for  further evaluation. 2. The left lung is clear. These results were called by telephone at the time of interpretation on 06/12/2021 at 11:51 am to provider Merlyn Lot , who verbally acknowledged these results. Electronically Signed   By: San Morelle M.D.   On: 06/12/2021 11:54   [place] CORE BIOPSY (LYMPH NODES)  Result Date: 06/20/2021 INDICATION: Lung mass with lymphadenopathy EXAM: ULTRASOUND GUIDED CORE NEEDLE BIOPSY BIOPSY OF RIGHT SUPRACLAVICULAR LYMPH NODE MEDICATIONS: None. ANESTHESIA/SEDATION: None PROCEDURE: The procedure, risks, benefits, and alternatives were explained to the patient. Questions regarding the procedure were encouraged and answered. The patient understands and consents to the procedure. The right supraclavicular region was prepped with chlorhexidine in a sterile fashion, and a sterile drape was applied covering the operative field. A sterile gown and sterile gloves were used for the procedure. Local anesthesia was provided with 1% Lidocaine. Utilizing continuous ultrasound guidance, 6-18 gauge cores were obtained from the enlarged right supraclavicular lymph node. All samples were sent to pathology in sterile saline. COMPLICATIONS: None immediate. IMPRESSION: Ultrasound-guided biopsy of enlarged right supraclavicular lymph node. Electronically Signed   By: Miachel Roux M.D.   On: 06/20/2021 11:54   IR IMAGING GUIDED PORT INSERTION  Result Date: 06/20/2021 INDICATION: Lung mass with metastatic lymphadenopathy EXAM: IMPLANTED PORT A CATH PLACEMENT WITH ULTRASOUND AND FLUOROSCOPIC GUIDANCE MEDICATIONS: None ANESTHESIA/SEDATION: Moderate (conscious) sedation was employed during this procedure. A total of Versed 3 mg and Fentanyl 100 mcg was administered intravenously. Moderate Sedation Time: 27 minutes. The patient's level of consciousness and vital signs were monitored continuously by radiology nursing throughout the procedure under my direct supervision. FLUOROSCOPY TIME:  Two  minutes, 48 seconds (15 mGy) COMPLICATIONS: None immediate. PROCEDURE: The procedure, risks, benefits, and alternatives were explained to the patient. Questions regarding the procedure were encouraged and answered. The patient understands and consents to the procedure. A timeout was performed prior to the initiation of the procedure. Patient positioned supine on the angiography table. Left neck and anterior upper chest prepped and draped in the usual sterile fashion. All elements of maximal sterile barrier were utilized including, cap, mask, sterile gown, sterile gloves, large sterile drape, hand scrubbing and 2% Chlorhexidine for skin cleaning. The left internal jugular vein was evaluated with ultrasound and shown to be patent. A permanent ultrasound image was obtained and placed in the patient's medical record. Local anesthesia was provided with 1% lidocaine with epinephrine. Using sterile gel and a sterile probe cover, the left internal jugular vein was entered with a 21 ga needle during real time ultrasound guidance. 0.018 inch guidewire placed and 21 ga needle exchanged for transitional dilator set. Utilizing fluoroscopy, 0.035 inch guidewire advanced through the needle without difficulty. Attention then turned to the left anterior upper chest. Following local lidocaine administration, a port pocket was created. The catheter was connected to the port and brought from the pocket to the venotomy site through a subcutaneous tunnel. The catheter was cut to size and inserted through the peel-away sheath. The catheter tip was positioned at the cavoatrial junction using fluoroscopic guidance. The port aspirated and flushed well. The port pocket was closed with deep and  superficial absorbable suture. The port pocket incision and venotomy sites were also sealed with Dermabond. IMPRESSION: Successful placement of a left internal jugular approach power injectable Port-A-Cath. The catheter is ready for immediate use.  Electronically Signed   By: Miachel Roux M.D.   On: 06/20/2021 11:52    PERFORMANCE STATUS (ECOG) : 1 - Symptomatic but completely ambulatory  Review of Systems Unless otherwise noted, a complete review of systems is negative.  Physical Exam General: NAD Cardiovascular: regular rate and rhythm Pulmonary: clear anterior/posterior fields Abdomen: soft, nontender, hypoactive bowel sounds GU: no suprapubic tenderness Extremities: no edema, no joint deformities Skin: no rashes Neurological: Weakness but otherwise nonfocal  Assessment and Plan- Patient is a 70 y.o. female newly diagnosed stage IV lung cancer with brain metastasis on whole brain radiation who presents to [facility] with nausea   Nausea and vomiting -suspect symptoms are secondary to brain metastasis/XRT.  Patient has been unable to take her dexamethasone over the past several days.  We will plan to give IV dexamethasone today in addition to fluids.  We will also give IV ondansetron.  Will send Rx for ondansetron 8 mg ODT with recommendation to take on a scheduled basis over the next several days.  We will send Rx for prochlorperazine for use if needed. Patient sees Dr. Grayland Ormond tomorrow to discuss treatment options.  Constipation -patient is taking a stool softener has not had a bowel movement in several days.  Oral intake is also been minimal.  No abdominal pain or distention.  No abdominal pathology noted on recent PET.  Discussed OTC bowel regimen including use of MiraLAX/Senokot  Hypokalemia -patient's been unable to tolerate swallowing the large potassium tablets even when mixed with applesauce.  We will give her IV KCl today. Will switch to oral elixir.  Repeat labs tomorrow.  Case and plan discussed with Dr. Grayland Ormond.  Patient to RTC tomorrow to speak with Dr. Grayland Ormond regarding treatment options.   Patient expressed understanding and was in agreement with this plan. She also understands that She can call clinic at any time with  any questions, concerns, or complaints.   Thank you for allowing me to participate in the care of this very pleasant patient.   Time Total: 20 minutes  Visit consisted of counseling and education dealing with the complex and emotionally intense issues of symptom management and palliative care in the setting of serious and potentially life-threatening illness.Greater than 50%  of this time was spent counseling and coordinating care related to the above assessment and plan.  Signed by: Altha Harm, PhD, NP-C

## 2021-06-26 NOTE — Progress Notes (Signed)
Pt reports nausea and vomiting that has persisted since Friday. She is unable to keep anything down. Only been able to drink a small amount of water. She has not checked her temperature, but felt as though she's had a fever.

## 2021-06-27 ENCOUNTER — Inpatient Hospital Stay: Payer: Medicare Other

## 2021-06-27 ENCOUNTER — Encounter: Payer: Self-pay | Admitting: *Deleted

## 2021-06-27 ENCOUNTER — Inpatient Hospital Stay (HOSPITAL_BASED_OUTPATIENT_CLINIC_OR_DEPARTMENT_OTHER): Payer: Medicare Other | Admitting: Oncology

## 2021-06-27 ENCOUNTER — Ambulatory Visit
Admission: RE | Admit: 2021-06-27 | Discharge: 2021-06-27 | Disposition: A | Discharge status: Home / Self Care | Payer: Medicare Other | Source: Ambulatory Visit | Attending: Radiation Oncology | Admitting: Radiation Oncology

## 2021-06-27 ENCOUNTER — Encounter: Payer: Self-pay | Admitting: Oncology

## 2021-06-27 VITALS — BP 127/82 | HR 110 | Temp 98.1°F | Resp 18 | Wt 126.5 lb

## 2021-06-27 DIAGNOSIS — C3411 Malignant neoplasm of upper lobe, right bronchus or lung: Secondary | ICD-10-CM | POA: Insufficient documentation

## 2021-06-27 DIAGNOSIS — Z95828 Presence of other vascular implants and grafts: Secondary | ICD-10-CM

## 2021-06-27 DIAGNOSIS — C7931 Secondary malignant neoplasm of brain: Secondary | ICD-10-CM

## 2021-06-27 DIAGNOSIS — R918 Other nonspecific abnormal finding of lung field: Secondary | ICD-10-CM

## 2021-06-27 DIAGNOSIS — Z5112 Encounter for antineoplastic immunotherapy: Secondary | ICD-10-CM | POA: Diagnosis not present

## 2021-06-27 DIAGNOSIS — Z510 Encounter for antineoplastic radiation therapy: Secondary | ICD-10-CM | POA: Diagnosis not present

## 2021-06-27 LAB — CBC
HCT: 45.5 % (ref 36.0–46.0)
Hemoglobin: 14.8 g/dL (ref 12.0–15.0)
MCH: 27.9 pg (ref 26.0–34.0)
MCHC: 32.5 g/dL (ref 30.0–36.0)
MCV: 85.7 fL (ref 80.0–100.0)
Platelets: 244 10*3/uL (ref 150–400)
RBC: 5.31 MIL/uL — ABNORMAL HIGH (ref 3.87–5.11)
RDW: 14.7 % (ref 11.5–15.5)
WBC: 16.3 10*3/uL — ABNORMAL HIGH (ref 4.0–10.5)
nRBC: 0 % (ref 0.0–0.2)

## 2021-06-27 MED ORDER — SODIUM CHLORIDE 0.9% FLUSH
10.0000 mL | Freq: Once | INTRAVENOUS | Status: AC
Start: 2021-06-27 — End: 2021-06-27
  Administered 2021-06-27: 10 mL via INTRAVENOUS
  Filled 2021-06-27: qty 10

## 2021-06-27 MED ORDER — HEPARIN SOD (PORK) LOCK FLUSH 100 UNIT/ML IV SOLN
500.0000 [IU] | Freq: Once | INTRAVENOUS | Status: AC
  Filled 2021-06-27: qty 5

## 2021-06-27 NOTE — Progress Notes (Signed)
Met with patient during follow up visit with Dr. Grayland Ormond to discuss results and treatment options. All questions answered during visit. Pt given resources regarding diagnosis and supportive services available. Reviewed upcoming appts. Instructed to call with any questions or needs. Pt verbalized understanding.

## 2021-06-27 NOTE — Progress Notes (Signed)
Patient would like to know what she can take for headaches.  What can she do for her mouth feeling sore and bad breath.  Has episodes of SOBr with last episode this morning.

## 2021-06-27 NOTE — Progress Notes (Signed)
START ON PATHWAY REGIMEN - Non-Small Cell Lung     A cycle is every 21 days:     Paclitaxel      Carboplatin      Bevacizumab-xxxx   **Always confirm dose/schedule in your pharmacy ordering system**  Patient Characteristics: Stage IV Metastatic, Nonsquamous, Awaiting Molecular Test Results and Need to Start Chemotherapy, PS = 0, 1 Therapeutic Status: Stage IV Metastatic Histology: Nonsquamous Cell Broad Molecular Profiling Status: Awaiting Molecular Test Results and Need to Start Chemotherapy ECOG Performance Status: 1 Intent of Therapy: Non-Curative / Palliative Intent, Discussed with Patient

## 2021-06-28 ENCOUNTER — Ambulatory Visit
Admission: RE | Admit: 2021-06-28 | Discharge: 2021-06-28 | Disposition: A | Discharge status: Home / Self Care | Payer: Medicare Other | Source: Ambulatory Visit | Attending: Radiation Oncology | Admitting: Radiation Oncology

## 2021-06-28 ENCOUNTER — Other Ambulatory Visit: Payer: Medicare Other

## 2021-06-28 DIAGNOSIS — Z510 Encounter for antineoplastic radiation therapy: Secondary | ICD-10-CM | POA: Diagnosis not present

## 2021-06-28 NOTE — Progress Notes (Signed)
Pharmacist Chemotherapy Monitoring - Initial Assessment    Anticipated start date: 07/05/21   The following has been reviewed per standard work regarding the patient's treatment regimen: The patient's diagnosis, treatment plan and drug doses, and organ/hematologic function Lab orders and baseline tests specific to treatment regimen  The treatment plan start date, drug sequencing, and pre-medications Prior authorization status  Patient's documented medication list, including drug-drug interaction screen and prescriptions for anti-emetics and supportive care specific to the treatment regimen The drug concentrations, fluid compatibility, administration routes, and timing of the medications to be used The patient's access for treatment and lifetime cumulative dose history, if applicable  The patient's medication allergies and previous infusion related reactions, if applicable   Changes made to treatment plan:  treatment plan date  Follow up needed:  [place], [place], 06/28/2021  1:42 PM

## 2021-06-29 ENCOUNTER — Other Ambulatory Visit: Payer: Medicare Other

## 2021-06-29 ENCOUNTER — Ambulatory Visit
Admission: RE | Admit: 2021-06-29 | Discharge: 2021-06-29 | Disposition: A | Discharge status: Home / Self Care | Payer: Medicare Other | Source: Ambulatory Visit | Attending: Radiation Oncology | Admitting: Radiation Oncology

## 2021-06-29 DIAGNOSIS — Z510 Encounter for antineoplastic radiation therapy: Secondary | ICD-10-CM | POA: Diagnosis not present

## 2021-06-29 NOTE — Progress Notes (Signed)
Tumor Board Documentation  Sandra Jordan was presented by Dr Grayland Ormond at our Tumor Board on 06/29/2021, which included representatives from medical oncology, surgical, radiology, pathology, navigation, internal medicine, radiation oncology, palliative care, research, nutrition, pharmacy, pulmonology.  Sandra Jordan currently presents as a new patient, for Sandra Jordan, for new positive pathology with history of the following treatments: surgical intervention(s).  Additionally, we reviewed previous medical and familial history, history of present illness, and recent lab results along with all available histopathologic and imaging studies. The tumor board considered available treatment options and made the following recommendations: Additional screening, Chemotherapy (PET scan, Awaiting Omniseq testing results) Radiation Therapy to Brain  The following procedures/referrals were also placed: No orders of the defined types were placed in this encounter.   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  Tumor board is a meeting of clinicians from various specialty areas who evaluate and discuss patients for whom a multidisciplinary approach is being considered. Final determinations in the plan of care are those of the provider(s). The responsibility for follow up of recommendations given during tumor board is that of the provider.   Today's extended care, comprehensive team conference, Sandra Jordan was not present for the discussion and was not examined.   Multidisciplinary Tumor Board is a multidisciplinary case peer review process.  Decisions discussed in the Multidisciplinary Tumor Board reflect the opinions of the specialists present at the conference without having examined the patient.   Ultimately, treatment and diagnostic decisions rest with the primary provider(s) and the patient.

## 2021-06-30 ENCOUNTER — Ambulatory Visit
Admission: RE | Admit: 2021-06-30 | Discharge: 2021-06-30 | Disposition: A | Discharge status: Home / Self Care | Payer: Medicare Other | Source: Ambulatory Visit | Attending: Radiation Oncology | Admitting: Radiation Oncology

## 2021-06-30 ENCOUNTER — Inpatient Hospital Stay: Payer: Medicare Other

## 2021-06-30 VITALS — BP 133/75 | HR 78 | Temp 97.5°F | Resp 18

## 2021-06-30 DIAGNOSIS — Z5112 Encounter for antineoplastic immunotherapy: Secondary | ICD-10-CM | POA: Diagnosis not present

## 2021-06-30 DIAGNOSIS — C3411 Malignant neoplasm of upper lobe, right bronchus or lung: Secondary | ICD-10-CM

## 2021-06-30 DIAGNOSIS — Z510 Encounter for antineoplastic radiation therapy: Secondary | ICD-10-CM | POA: Diagnosis not present

## 2021-06-30 MED ORDER — DEXAMETHASONE SODIUM PHOSPHATE 10 MG/ML IJ SOLN
10.0000 mg | Freq: Once | INTRAMUSCULAR | Status: AC
  Administered 2021-06-30: 10 mg via INTRAVENOUS

## 2021-06-30 MED ORDER — HEPARIN SOD (PORK) LOCK FLUSH 100 UNIT/ML IV SOLN
500.0000 [IU] | Freq: Once | INTRAVENOUS | Status: AC | PRN
Start: 2021-06-30 — End: 2021-06-30
  Administered 2021-06-30: 500 [IU]
  Filled 2021-06-30: qty 5

## 2021-06-30 MED ORDER — SODIUM CHLORIDE 0.9% FLUSH
10.0000 mL | Freq: Once | INTRAVENOUS | Status: AC | PRN
  Administered 2021-06-30: 10 mL
  Filled 2021-06-30: qty 10

## 2021-06-30 MED ORDER — SODIUM CHLORIDE 0.9 % IV SOLN
Freq: Once | INTRAVENOUS | Status: AC
  Filled 2021-06-30: qty 250

## 2021-06-30 NOTE — Progress Notes (Signed)
No blood return from port. Flushes very easily. Fluid challenge with no swelling. Receiving IV hydration plus dexamethasone. Going to radiation when she leaves this clinic.

## 2021-07-01 NOTE — Progress Notes (Signed)
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  INTERVAL HISTORY: Patient returns to clinic today for further evaluation and consideration of cycle 1 of carboplatinum, Taxol, and Avastin.  She has increased abdominal tenderness and bloating secondary to constipation, but otherwise feels well.  She has now completed XRT to her brain.  She has chronic weakness and fatigue.  She has no neurologic complaints.  She denies any recent fevers or illnesses.  She has a poor appetite.  She has no chest pain, shortness of breath, cough, or hemoptysis.  She denies any nausea, vomiting, constipation, or diarrhea.  She has no urinary complaints.  Patient offers no further specific complaints today.  REVIEW OF SYSTEMS:   Review of Systems  Constitutional:  Positive for malaise/fatigue. Negative for fever and weight loss.  Respiratory: Negative.  Negative for cough, hemoptysis and shortness of breath.   Cardiovascular: Negative.  Negative for chest pain and leg swelling.  Gastrointestinal:  Positive for abdominal pain, constipation and nausea.  Genitourinary: Negative.  Negative for dysuria.  Musculoskeletal: Negative.  Negative for back pain.  Skin: Negative.  Negative for rash.  Neurological:  Positive for weakness. Negative for dizziness, focal weakness and headaches.  Psychiatric/Behavioral: Negative.  The patient is not nervous/anxious.    As per HPI. Otherwise, a complete review of systems is negative.  PAST MEDICAL  HISTORY: Past Medical History:  Diagnosis Date   Allergy    Colon polyp    Hyperlipidemia    Hypertension     PAST SURGICAL HISTORY: Past Surgical History:  Procedure Laterality Date   ABDOMINAL HYSTERECTOMY  1984   BREAST BIOPSY Bilateral 1990   fibrocystic breast   BREAST BIOPSY Right August 2014   fibrocystic breast   CHOLECYSTECTOMY  2008   COLONOSCOPY  2012, 2017   [facility]   IR IMAGING GUIDED PORT INSERTION  06/20/2021    FAMILY HISTORY: Family History  Problem Relation Age of Onset   Lung cancer Mother    Colon cancer Father 40   Prostate cancer Father    Lung cancer Sister    Lung cancer Maternal Uncle    Stomach cancer Maternal Grandfather    Breast cancer Niece     ADVANCED DIRECTIVES (Y/N):  N  HEALTH MAINTENANCE: Social History   Tobacco Use   Smoking status: Former    Years: 15.00    Types: Cigarettes    Quit date: 10/08/2004    Years since quitting: 16.7   Smokeless tobacco: Never  Substance Use Topics   Alcohol use: No    Alcohol/week: 0.0 standard drinks   Drug use: No     Colonoscopy:  PAP:  Bone density:  Lipid panel:  No Known Allergies  Current Outpatient Medications  Medication Sig Dispense Refill   cetirizine (ZYRTEC) 10 MG tablet Take 10 mg by mouth daily.     dexamethasone (DECADRON) 4 MG tablet Take 1 tablet (4 mg total) by mouth 2 (two) times daily with a meal. 60 tablet 1   hydrALAZINE (APRESOLINE) 25 MG tablet Take 25 mg by mouth 2 (two) times daily.  hydrochlorothiazide (HYDRODIURIL) 25 MG tablet Take 25 mg by mouth daily. Prn if bp above 150/90     lidocaine-prilocaine (EMLA) cream Apply 1 application topically as needed. 30 g 0   lovastatin (MEVACOR) 40 MG tablet SMARTSIG:1 Tablet(s) By Mouth Every Evening     ondansetron (ZOFRAN-ODT) 8 MG disintegrating tablet Take 1 tablet (8 mg total) by mouth every 8 (eight) hours as needed for nausea or vomiting. 60 tablet 0   potassium chloride 20 MEQ/15ML (10%) SOLN Take 15 mLs  (20 mEq total) by mouth 2 (two) times daily. 473 mL 0   cyanocobalamin 1000 MCG tablet Take by mouth. (Patient not taking: No sig reported)     fluticasone (FLONASE) 50 MCG/ACT nasal spray Place into both nostrils. (Patient not taking: Reported on 07/05/2021)     ibuprofen (ADVIL) 800 MG tablet Take 800 mg by mouth every 6 (six) hours as needed. (Patient not taking: No sig reported)     lovastatin (MEVACOR) 20 MG tablet Take 20 mg by mouth at bedtime. (Patient not taking: No sig reported)     meloxicam (MOBIC) 7.5 MG tablet Take 7.5 mg by mouth daily. (Patient not taking: No sig reported)     potassium chloride (KLOR-CON) 10 MEQ tablet Take 10 mEq by mouth daily. (Patient not taking: No sig reported)     prochlorperazine (COMPAZINE) 10 MG tablet Take 1 tablet (10 mg total) by mouth every 6 (six) hours as needed for nausea or vomiting. (Patient not taking: No sig reported) 60 tablet 0   No current facility-administered medications for this visit.   Facility-Administered Medications Ordered in Other Visits  Medication Dose Route Frequency Provider Last Rate Last Admin   heparin lock flush 100 unit/mL  500 Units Intravenous Once Borders, Kirt Boys, NP        OBJECTIVE: Vitals:   07/05/21 0918  BP: 136/89  Pulse: 90  Resp: 16  Temp: 97.9 F (36.6 C)  SpO2: 99%     Body mass index is 21.97 kg/m.    ECOG FS:2 - Symptomatic, <50% confined to bed  General: Well-developed, well-nourished, no acute distress. Eyes: Pink conjunctiva, anicteric sclera. HEENT: Normocephalic, moist mucous membranes. Lungs: No audible wheezing or coughing. Heart: Regular rate and rhythm. Abdomen: Soft, nontender, no obvious distention. Musculoskeletal: No edema, cyanosis, or clubbing. Neuro: Alert, answering all questions appropriately. Cranial nerves grossly intact. Skin: No rashes or petechiae noted. Psych: Normal affect.   LAB RESULTS:  Lab Results  Component Value Date   NA 136 07/05/2021   K 4.0  07/05/2021   CL 96 (L) 07/05/2021   CO2 29 07/05/2021   GLUCOSE 123 (H) 07/05/2021   BUN 17 07/05/2021   CREATININE 1.02 (H) 07/05/2021   CALCIUM 9.8 07/05/2021   PROT 7.0 07/05/2021   ALBUMIN 4.0 07/05/2021   AST 30 07/05/2021   ALT 86 (H) 07/05/2021   ALKPHOS 90 07/05/2021   BILITOT 0.6 07/05/2021   GFRNONAA 60 (L) 07/05/2021   GFRAA >60 01/26/2013    Lab Results  Component Value Date   WBC 13.2 (H) 07/05/2021   NEUTROABS 11.2 (H) 07/05/2021   HGB 14.2 07/05/2021   HCT 43.8 07/05/2021   MCV 86.9 07/05/2021   PLT 231 07/05/2021     STUDIES: CT HEAD WO CONTRAST (5MM)  Result Date: 06/12/2021 CLINICAL DATA:  Dizziness. EXAM: CT HEAD WITHOUT CONTRAST TECHNIQUE: Contiguous axial images were obtained from the base of the skull through the vertex without intravenous contrast. COMPARISON:  None. FINDINGS: Brain:  Multiple mass like lesions are present with surrounding edema. Hyperdense mass in the left frontal lobe measures 18 x 18 x 17 mm. A right occipital lesion measures 13 x 11 x 15 mm with surrounding vasogenic edema mass effect. A lesion in the left cerebellum just below the tentorium measures 17 x 13 x 9 mm with vasogenic edema and mass effect in the left hemisphere. There is some mass effect on the fourth ventricle without obstruction. A left frontal lobe lesion near the vertex measures up to 12 mm. There is some surrounding edema. Hyperdense sellar/suprasellar mass measures 26 x 17 x 18 mm. This creates mass effect on the optic chiasm. Ventricles are of normal size. No significant extraaxial fluid collection is present. Vascular: No hyperdense vessel or unexpected calcification. Skull: Calvarium is intact. No focal lytic or blastic lesions are present. No significant extracranial soft tissue lesion is present. Sinuses/Orbits: The paranasal sinuses and mastoid air cells are clear. The globes and orbits are within normal limits. IMPRESSION: 1. Multiple mass like lesions with surrounding  edema and mass effect in the brain, as described. The largest lesion is in the left frontal lobe measuring 18 x 18 x 17 mm. The findings are concerning for metastatic disease to the brain. MRI of the brain without and with contrast is recommended for further evaluation. 2. Hyperdense sellar/suprasellar mass measures 26 x 17 x 18 mm. This creates mass effect on the optic chiasm. This could represent a pituitary macroadenoma. Additional metastatic lesion also considered. Electronically Signed   By: San Morelle M.D.   On: 06/12/2021 12:09   MR Brain W and Wo Contrast  Result Date: 06/12/2021 CLINICAL DATA:  Brain mass or lesion. EXAM: MRI HEAD WITHOUT AND WITH CONTRAST TECHNIQUE: Multiplanar, multiecho pulse sequences of the brain and surrounding structures were obtained without and with intravenous contrast. CONTRAST:  30mL MAGNEVIST GADOPENTETATE DIMEGLUMINE 469.01 MG/ML IV SOLN COMPARISON:  Head CT performed earlier today 06/12/2021. FINDINGS: Brain: Cerebral volume is normal. There are numerable enhancing parenchymal metastases within the supratentorial brain, brainstem and bilateral cerebellar hemispheres compatible with intracranial metastatic disease. The 2 largest supratentorial lesions measure 2.0 x 1.9 x 2.0 cm within the mid left frontal lobe/left frontal operculum (series 19, image 95) and 2.5 x 2.0 x 2.7 cm within the right occipital lobe (for instance as seen on series 19, image 70). Additionally, there is an enhancing mass centered in the region of the anteroinferior third ventricle, hypothalamus and suprasellar region measuring 1.5 x 1.9 x 2.2 cm. This mass is located posterior to the optic chiasm, anteriorly displacing and exerting mass effect upon the optic chiasm. This lesion also encroaches upon the anteroinferior aspect of the third ventricle. The largest infratentorial lesion is located within the left cerebellum and measures 4.0 x 3.4 x 2.9 cm. There is mild edema surrounding many of  these lesions. Additionally, many of these lesions demonstrate internal SWI signal loss which may reflect hemorrhage and/or mineralization. Local mass effect associated with the largest supratentorial lesions. No midline shift. Significant mass effect associated with the dominant left cerebellar lesion with rightward deviation and partial effacement of the fourth ventricle. No evidence of hydrocephalus at this time. Background mild multifocal T2/FLAIR hyperintensity within the cerebral white matter, nonspecific but compatible with chronic small vessel ischemic disease. There is no acute infarct. No extra-axial fluid collection. Vascular: Maintained flow voids within the proximal large arterial vessels. Skull and upper cervical spine: No focal suspicious marrow lesion. Incompletely assessed cervical spondylosis. Sinuses/Orbits: Visualized orbits  show no acute finding. No significant paranasal sinus disease. Other: Nonspecific 10 mm lesion associated with the left external ear. IMPRESSION: Innumerable intracranial parenchymal metastases within the supratentorial and infratentorial brain, as described. Measurements for the largest supratentorial and infratentorial lesions have been provided. Additional 2.2 x 1.9 cm enhancing mass centered in the region of the anteroinferior third ventricle, hypothalamus and suprasellar region which is also favored to reflect a metastasis. This mass is located posterior to the optic chiasm, anteriorly displacing and exerting mass effect upon the optic chiasm. This lesion also encroaches upon the anteroinferior aspect of the third ventricle. There is mild edema surrounding many of the lesions. Additionally, there is SWI signal loss associated with many of the lesions which may reflect mineralization and/or hemorrhage. Mass effect associated with the dominant left cerebellar lesion with rightward deviation and partial effacement of the fourth ventricle. No evidence of hydrocephalus at  this time. Background mild chronic small vessel ischemic changes within cerebral white matter. Nonspecific 10 mm round lesion associated with the left external ear. Direct visualization recommended. Electronically Signed   By: Kellie Simmering D.O.   On: 06/12/2021 14:35   CT CHEST ABDOMEN PELVIS W CONTRAST  Result Date: 06/12/2021 CLINICAL DATA:  Dizziness. Vomiting. Abdominal pain. Abnormal chest radiograph. EXAM: CT CHEST, ABDOMEN, AND PELVIS WITH CONTRAST TECHNIQUE: Multidetector CT imaging of the chest, abdomen and pelvis was performed following the standard protocol during bolus administration of intravenous contrast. CONTRAST:  41mL OMNIPAQUE IOHEXOL 350 MG/ML SOLN COMPARISON:  Chest radiograph from earlier today. 02/19/2017 CT abdomen/pelvis. FINDINGS: CT CHEST FINDINGS Cardiovascular: Normal heart size. No significant pericardial effusion/thickening. Mildly atherosclerotic nonaneurysmal thoracic aorta. Normal caliber pulmonary arteries. No central pulmonary emboli. Mediastinum/Nodes: Hypodense 1.0 cm right thyroid nodule. Unremarkable esophagus. Heterogeneously enhancing bilateral supraclavicular adenopathy measuring 2.1 cm short axis diameter on the left (series 2/image 7) and 1.4 cm on the right (series 2/image 8). No right axillary adenopathy. Enlarged heterogeneous 2.7 cm left axillary node (series 2/image 14). Enlarged 1.1 cm left retropectoral node (series 2/image 13). Enlarged heterogeneous bilateral prevascular mediastinal nodes, largest 2.0 cm on the left (series 2/image 19). Enlarged 2.9 cm AP window heterogeneous node (series 2/image 21). Bulky confluent heterogeneous right paratracheal adenopathy up to 3.0 cm (series 2/image 19). Enlarged heterogeneous 2.9 cm subcarinal node (series 2/image 25). Enlarged heterogeneous 2.6 cm right hilar node (series 2/image 21). No pathologically enlarged left hilar nodes. Lungs/Pleura: No pneumothorax. No pleural effusion. Obstructing central right upper lobe  lung mass with abrupt cut off of the right upper lobe bronchus, poorly delineated given complete right upper lobe collapse, approximately 6.0 x 5.6 cm (series 2/image 14). Predominantly solid 2.5 x 2.1 cm peripheral right lower lobe pulmonary nodule (series 4/image 76). Several (at least 8) additional subcentimeter solid pulmonary nodules in both lungs, largest 0.6 cm in the posterior right middle lobe (series 4/image 77). Musculoskeletal: No aggressive appearing focal osseous lesions. Marked thoracic spondylosis. CT ABDOMEN PELVIS FINDINGS Hepatobiliary: Normal liver with no liver mass. Cholecystectomy. No biliary ductal dilatation. Pancreas: Normal, with no mass or duct dilation. Spleen: Normal size. No mass. Adrenals/Urinary Tract: Normal adrenals. Nonobstructing 5 mm lower left renal stone. Subcentimeter hypodense bilateral renal cortical lesions are too small to characterize and require no follow-up. No hydronephrosis. Normal bladder. Stomach/Bowel: Small hiatal hernia. Otherwise normal nondistended stomach. Normal caliber small bowel with no small bowel wall thickening. Normal appendix. Mild left colonic diverticulosis with no large bowel wall thickening or significant pericolonic fat stranding. Vascular/Lymphatic: Atherosclerotic nonaneurysmal abdominal aorta.  Patent portal, splenic, hepatic and renal veins. No pathologically enlarged lymph nodes in the abdomen or pelvis. Reproductive: Status post hysterectomy, with no abnormal findings at the vaginal cuff. No adnexal mass. Other: No pneumoperitoneum, ascites or focal fluid collection. Musculoskeletal: No aggressive appearing focal osseous lesions. Moderate lumbar spondylosis. IMPRESSION: 1. Suspected obstructing central right upper lobe lung mass with abrupt cut off of the right upper lobe bronchus, poorly delineated given complete right upper lobe collapse, estimated at 6.0 cm size, highly suspicious for primary bronchogenic carcinoma. 2. Bilateral  supraclavicular, bilateral mediastinal, right hilar, left retropectoral and left axillary lymphadenopathy with heterogeneous enhancement, compatible with metastatic disease. 3. Predominantly solid 2.5 cm right lower lobe pulmonary nodule is suspicious for metastasis versus a second primary lung neoplasm. Several (at least 8) additional subcentimeter solid pulmonary nodules scattered in both lungs, suspect pulmonary metastases. 4. Multi disciplinary thoracic oncology consultation suggested. Consider PET-CT for further staging evaluation. 5. No evidence of metastatic disease in the abdomen or pelvis. 6. Small hiatal hernia. 7. Mild left colonic diverticulosis. 8. Nonobstructing left nephrolithiasis. 9. Aortic Atherosclerosis (ICD10-I70.0). Electronically Signed   By: Ilona Sorrel M.D.   On: 06/12/2021 13:00   NM PET Image Initial (PI) Skull Base To Thigh  Result Date: 06/22/2021 CLINICAL DATA:  Initial treatment strategy for lung nodules. EXAM: NUCLEAR MEDICINE PET SKULL BASE TO THIGH TECHNIQUE: 7.4 mCi F-18 FDG was injected intravenously. Full-ring PET imaging was performed from the skull base to thigh after the radiotracer. CT data was obtained and used for attenuation correction and anatomic localization. Fasting blood glucose: 94 mg/dl COMPARISON:  06/12/2021 FINDINGS: Mediastinal blood pool activity: SUV max 2.8 Liver activity: SUV max NA NECK: Abnormal hypoactivity in a mildly hyperdense 3.4 by 2.2 cm left cerebellar mass on image 4 series 3. 1.6 cm suprasellar mass. 1.0 cm right cerebellar mass on image 8 series 3. The patient has numerous additional known parenchymal masses involving the cerebrum, cerebellum, and brainstem based on the MRI of 06/12/2021, although most of these are too small for accurate PET-CT characterization. Incidental CT findings: none CHEST: Hypermetabolic right upper lobe mass partially surrounded by atelectatic lung, maximum SUV of the mass is 11.2. The dominant hypermetabolic  associated activity associated with this mass measures about 4.4 by 3.6 cm, but the mass is felt to be larger and extend further towards the right hilum based on the recent CT appearance on 06/12/2021. There is truncation and complete obstruction of the right upper lobe bronchus medial to this mass, possibly due to a less hypermetabolic component of the mass or adjacent adenopathy. The overall region of including the mass and surrounding atelectatic lung measures about 6.8 by 6.1 cm on image 62 series 3 and extends down to the left hilum and the truncated left upper lobe bronchus. Substantial bilateral supraclavicular, prevascular, paratracheal, AP window, subcarinal, right hilar, left axillary adenopathy. Index left supraclavicular node 1.9 cm in short axis on image 54 series 3 with maximum SUV 5.1. Index subcarinal node 2.5 cm in short axis on image 83 series 3, maximum SUV 3.5. Index left axillary lymph node 2.3 cm in short axis on image 82 series 3 with maximum SUV 5.0. Index AP window lymph node 2.7 cm in short axis on image 77 series 3 with maximum SUV 3.7. The 2.3 by 1.9 cm right lower lobe nodule on image 88 series 3 has maximum SUV of 1.4, and synchronous low-grade adenocarcinoma cannot be excluded. The other tiny pulmonary nodules in the right lung are too  small to characterize. Incidental CT findings: Mild aortic arch atherosclerotic calcification. Small type 1 hiatal hernia. Recently placed Port-A-Cath with some surrounding gas which is normal in the short-term postoperative setting, tip in the SVC. ABDOMEN/PELVIS: No significant abnormal hypermetabolic activity in this region. Incidental CT findings: Atherosclerosis is present, including aortoiliac atherosclerotic disease. Cholecystectomy. Nonobstructive left nephrolithiasis. Scattered colonic diverticula colonic diverticula. SKELETON: No significant abnormal hypermetabolic activity in this region. Incidental left antecubital activity is thought to be  injection related. Incidental CT findings: Spondylosis. IMPRESSION: 1. Dominant right upper lobe mass with truncation of the right upper lobe bronchus, with extensive associated thoracic adenopathy as well as intracranial metastatic lesions. 2. A 2.3 by 1.9 cm somewhat irregular right lower lobe nodule could represent a metastatic lesion or synchronous low-grade adenocarcinoma, but has only low-grade activity with maximum SUV of 1.4. The other tiny pulmonary nodules scattered in the right lung are too small to characterize. 3. No compelling findings of metastatic involvement of the abdomen/pelvis or visualized skeleton. 4. Other imaging findings of potential clinical significance: Aortic Atherosclerosis (ICD10-I70.0). Nonobstructive left nephrolithiasis. Scattered small type 1 hiatal hernia. Electronically Signed   By: Van Clines M.D.   On: 06/22/2021 07:36   DG Chest Portable 1 View  Result Date: 06/12/2021 CLINICAL DATA:  Infiltrate.  Dizziness.  Emesis. EXAM: PORTABLE CHEST 1 VIEW COMPARISON:  None. FINDINGS: Heart size is normal. Right upper lobe collapse noted. Opacity in the right upper hemithorax. Patchy opacity noted in the right lower lobe. The left lung is clear. Axial skeleton unremarkable. IMPRESSION: 1. Right upper lobe collapse and patchy right lower lobe airspace disease. This raises concern for underlying obstruction. Recommend CT of the chest with contrast for further evaluation. 2. The left lung is clear. These results were called by telephone at the time of interpretation on 06/12/2021 at 11:51 am to provider Merlyn Lot , who verbally acknowledged these results. Electronically Signed   By: San Morelle M.D.   On: 06/12/2021 11:54   [place] CORE BIOPSY (LYMPH NODES)  Result Date: 06/20/2021 INDICATION: Lung mass with lymphadenopathy EXAM: ULTRASOUND GUIDED CORE NEEDLE BIOPSY BIOPSY OF RIGHT SUPRACLAVICULAR LYMPH NODE MEDICATIONS: None. ANESTHESIA/SEDATION: None PROCEDURE: The  procedure, risks, benefits, and alternatives were explained to the patient. Questions regarding the procedure were encouraged and answered. The patient understands and consents to the procedure. The right supraclavicular region was prepped with chlorhexidine in a sterile fashion, and a sterile drape was applied covering the operative field. A sterile gown and sterile gloves were used for the procedure. Local anesthesia was provided with 1% Lidocaine. Utilizing continuous ultrasound guidance, 6-18 gauge cores were obtained from the enlarged right supraclavicular lymph node. All samples were sent to pathology in sterile saline. COMPLICATIONS: None immediate. IMPRESSION: Ultrasound-guided biopsy of enlarged right supraclavicular lymph node. Electronically Signed   By: Miachel Roux M.D.   On: 06/20/2021 11:54   IR IMAGING GUIDED PORT INSERTION  Result Date: 06/20/2021 INDICATION: Lung mass with metastatic lymphadenopathy EXAM: IMPLANTED PORT A CATH PLACEMENT WITH ULTRASOUND AND FLUOROSCOPIC GUIDANCE MEDICATIONS: None ANESTHESIA/SEDATION: Moderate (conscious) sedation was employed during this procedure. A total of Versed 3 mg and Fentanyl 100 mcg was administered intravenously. Moderate Sedation Time: 27 minutes. The patient's level of consciousness and vital signs were monitored continuously by radiology nursing throughout the procedure under my direct supervision. FLUOROSCOPY TIME:  Two minutes, 48 seconds (15 mGy) COMPLICATIONS: None immediate. PROCEDURE: The procedure, risks, benefits, and alternatives were explained to the patient. Questions regarding the procedure were encouraged  and answered. The patient understands and consents to the procedure. A timeout was performed prior to the initiation of the procedure. Patient positioned supine on the angiography table. Left neck and anterior upper chest prepped and draped in the usual sterile fashion. All elements of maximal sterile barrier were utilized including,  cap, mask, sterile gown, sterile gloves, large sterile drape, hand scrubbing and 2% Chlorhexidine for skin cleaning. The left internal jugular vein was evaluated with ultrasound and shown to be patent. A permanent ultrasound image was obtained and placed in the patient's medical record. Local anesthesia was provided with 1% lidocaine with epinephrine. Using sterile gel and a sterile probe cover, the left internal jugular vein was entered with a 21 ga needle during real time ultrasound guidance. 0.018 inch guidewire placed and 21 ga needle exchanged for transitional dilator set. Utilizing fluoroscopy, 0.035 inch guidewire advanced through the needle without difficulty. Attention then turned to the left anterior upper chest. Following local lidocaine administration, a port pocket was created. The catheter was connected to the port and brought from the pocket to the venotomy site through a subcutaneous tunnel. The catheter was cut to size and inserted through the peel-away sheath. The catheter tip was positioned at the cavoatrial junction using fluoroscopic guidance. The port aspirated and flushed well. The port pocket was closed with deep and superficial absorbable suture. The port pocket incision and venotomy sites were also sealed with Dermabond. IMPRESSION: Successful placement of a left internal jugular approach power injectable Port-A-Cath. The catheter is ready for immediate use. Electronically Signed   By: Miachel Roux M.D.   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  PLAN:    Stage IVa adenocarcinoma of the right upper lobe lung with innumerable brain metastasis: Patient has now completed whole brain XRT.  Patient has had port placement.  PET scan results from June 21, 2021 reviewed independently in part as above with multiple lung metastasis, but no evidence of systemic disease other than patient's known lesions in  her brain.  Molecular studies have been sent and are pending at time of dictation.  Plan to initiate chemotherapy with carboplatinum AUC 5, Taxol 175 mg per metered squared, and Avastin 15 mg/kg unless molecular studies indicate alternative treatment.  Patient also require Udenyca support.  Proceed with cycle 1 of treatment today.  Return to clinic in 2 days for Udenyca, 1 week for laboratory work and further evaluation, and then in 3 weeks for further evaluation and consideration of cycle 2. Brain metastasis: Patient is now completed XRT. Nausea, poor appetite: Continue Decadron 4 mg twice daily and ondansetron ODT.   Hypokalemia: Resolved.   Leukocytosis: Likely secondary to Decadron, monitor. Elevated liver enzymes: Nearly resolved. Constipation: Patient has been instructed to use MiraLAX as directed as well as continue stool softeners and increase fluid intake.  Patient expressed understanding and was in agreement with this plan. She also understands that She can call clinic at any time with any questions, concerns, or complaints.   Cancer Staging Primary adenocarcinoma of upper lobe of right lung [facility]) Staging form: Lung, AJCC 8th Edition - Clinical stage from 06/27/2021: Stage IVA (cT3, cN3, pM1b) - Signed by Lloyd Huger, MD on 06/27/2021 Stage prefix: Initial diagnosis  Lloyd Huger, MD   07/05/2021 10:05 AM

## 2021-07-03 ENCOUNTER — Ambulatory Visit
Admission: RE | Admit: 2021-07-03 | Discharge: 2021-07-03 | Disposition: A | Discharge status: Home / Self Care | Payer: Medicare Other | Source: Ambulatory Visit | Attending: Radiation Oncology | Admitting: Radiation Oncology

## 2021-07-03 ENCOUNTER — Inpatient Hospital Stay: Payer: Medicare Other

## 2021-07-03 DIAGNOSIS — Z510 Encounter for antineoplastic radiation therapy: Secondary | ICD-10-CM | POA: Diagnosis not present

## 2021-07-04 ENCOUNTER — Ambulatory Visit: Payer: Medicare Other

## 2021-07-04 ENCOUNTER — Ambulatory Visit
Admission: RE | Admit: 2021-07-04 | Discharge: 2021-07-04 | Disposition: A | Discharge status: Home / Self Care | Payer: Medicare Other | Source: Ambulatory Visit | Attending: Radiation Oncology | Admitting: Radiation Oncology

## 2021-07-04 DIAGNOSIS — Z510 Encounter for antineoplastic radiation therapy: Secondary | ICD-10-CM | POA: Diagnosis not present

## 2021-07-05 ENCOUNTER — Encounter: Payer: Self-pay | Admitting: *Deleted

## 2021-07-05 ENCOUNTER — Inpatient Hospital Stay: Payer: Medicare Other

## 2021-07-05 ENCOUNTER — Ambulatory Visit
Admission: RE | Admit: 2021-07-05 | Discharge: 2021-07-05 | Disposition: A | Discharge status: Home / Self Care | Payer: Medicare Other | Source: Ambulatory Visit | Attending: Radiation Oncology | Admitting: Radiation Oncology

## 2021-07-05 ENCOUNTER — Inpatient Hospital Stay (HOSPITAL_BASED_OUTPATIENT_CLINIC_OR_DEPARTMENT_OTHER): Payer: Medicare Other | Admitting: Oncology

## 2021-07-05 ENCOUNTER — Other Ambulatory Visit: Payer: Self-pay

## 2021-07-05 VITALS — BP 136/89 | HR 90 | Temp 97.9°F | Resp 16 | Wt 126.0 lb

## 2021-07-05 VITALS — BP 154/88 | HR 75 | Resp 20

## 2021-07-05 DIAGNOSIS — C3411 Malignant neoplasm of upper lobe, right bronchus or lung: Secondary | ICD-10-CM | POA: Diagnosis not present

## 2021-07-05 DIAGNOSIS — Z5112 Encounter for antineoplastic immunotherapy: Secondary | ICD-10-CM | POA: Diagnosis not present

## 2021-07-05 DIAGNOSIS — Z95828 Presence of other vascular implants and grafts: Secondary | ICD-10-CM | POA: Diagnosis not present

## 2021-07-05 DIAGNOSIS — Z510 Encounter for antineoplastic radiation therapy: Secondary | ICD-10-CM | POA: Diagnosis not present

## 2021-07-05 LAB — URINALYSIS, DIPSTICK ONLY
Bilirubin Urine: NEGATIVE
Glucose, UA: NEGATIVE mg/dL
Hgb urine dipstick: NEGATIVE
Ketones, ur: NEGATIVE mg/dL
Leukocytes,Ua: NEGATIVE
Nitrite: NEGATIVE
Protein, ur: NEGATIVE mg/dL
Specific Gravity, Urine: 1.003 — ABNORMAL LOW (ref 1.005–1.030)
pH: 7 (ref 5.0–8.0)

## 2021-07-05 LAB — COMPREHENSIVE METABOLIC PANEL
ALT: 86 U/L — ABNORMAL HIGH (ref 0–44)
AST: 30 U/L (ref 15–41)
Albumin: 4 g/dL (ref 3.5–5.0)
Alkaline Phosphatase: 90 U/L (ref 38–126)
Anion gap: 11 (ref 5–15)
BUN: 17 mg/dL (ref 8–23)
CO2: 29 mmol/L (ref 22–32)
Calcium: 9.8 mg/dL (ref 8.9–10.3)
Chloride: 96 mmol/L — ABNORMAL LOW (ref 98–111)
Creatinine, Ser: 1.02 mg/dL — ABNORMAL HIGH (ref 0.44–1.00)
GFR, Estimated: 60 mL/min — ABNORMAL LOW (ref >60)
Glucose, Bld: 123 mg/dL — ABNORMAL HIGH (ref 70–99)
Potassium: 4 mmol/L (ref 3.5–5.1)
Sodium: 136 mmol/L (ref 135–145)
Total Bilirubin: 0.6 mg/dL (ref 0.3–1.2)
Total Protein: 7 g/dL (ref 6.5–8.1)

## 2021-07-05 LAB — CBC WITH DIFFERENTIAL/PLATELET
Abs Immature Granulocytes: 0.16 10*3/uL — ABNORMAL HIGH (ref 0.00–0.07)
Basophils Absolute: 0 10*3/uL (ref 0.0–0.1)
Basophils Relative: 0 %
Eosinophils Absolute: 0.1 10*3/uL (ref 0.0–0.5)
Eosinophils Relative: 1 %
HCT: 43.8 % (ref 36.0–46.0)
Hemoglobin: 14.2 g/dL (ref 12.0–15.0)
Immature Granulocytes: 1 %
Lymphocytes Relative: 7 %
Lymphs Abs: 0.9 10*3/uL (ref 0.7–4.0)
MCH: 28.2 pg (ref 26.0–34.0)
MCHC: 32.4 g/dL (ref 30.0–36.0)
MCV: 86.9 fL (ref 80.0–100.0)
Monocytes Absolute: 0.9 10*3/uL (ref 0.1–1.0)
Monocytes Relative: 7 %
Neutro Abs: 11.2 10*3/uL — ABNORMAL HIGH (ref 1.7–7.7)
Neutrophils Relative %: 84 %
Platelets: 231 10*3/uL (ref 150–400)
RBC: 5.04 MIL/uL (ref 3.87–5.11)
RDW: 14.9 % (ref 11.5–15.5)
WBC: 13.2 10*3/uL — ABNORMAL HIGH (ref 4.0–10.5)
nRBC: 0 % (ref 0.0–0.2)

## 2021-07-05 MED ORDER — FAMOTIDINE 20 MG IN NS 100 ML IVPB
20.0000 mg | Freq: Once | INTRAVENOUS | Status: AC
  Administered 2021-07-05: 20 mg via INTRAVENOUS
  Filled 2021-07-05: qty 20

## 2021-07-05 MED ORDER — SODIUM CHLORIDE 0.9 % IV SOLN
150.0000 mg | Freq: Once | INTRAVENOUS | Status: AC
  Administered 2021-07-05: 150 mg via INTRAVENOUS
  Filled 2021-07-05: qty 150

## 2021-07-05 MED ORDER — HEPARIN SOD (PORK) LOCK FLUSH 100 UNIT/ML IV SOLN
500.0000 [IU] | Freq: Once | INTRAVENOUS | Status: DC | PRN
  Filled 2021-07-05: qty 5

## 2021-07-05 MED ORDER — ACETAMINOPHEN 325 MG PO TABS
650.0000 mg | ORAL_TABLET | Freq: Once | ORAL | Status: AC
  Administered 2021-07-05: 650 mg via ORAL
  Filled 2021-07-05: qty 2

## 2021-07-05 MED ORDER — BEVACIZUMAB-BVZR CHEMO INJECTION 400 MG/16ML
15.0000 mg/kg | Freq: Once | INTRAVENOUS | Status: AC
  Administered 2021-07-05: 900 mg via INTRAVENOUS
  Filled 2021-07-05: qty 16

## 2021-07-05 MED ORDER — SODIUM CHLORIDE 0.9 % IV SOLN
10.0000 mg | Freq: Once | INTRAVENOUS | Status: AC
  Administered 2021-07-05: 10 mg via INTRAVENOUS
  Filled 2021-07-05: qty 10

## 2021-07-05 MED ORDER — SODIUM CHLORIDE 0.9 % IV SOLN
175.0000 mg/m2 | Freq: Once | INTRAVENOUS | Status: AC
  Administered 2021-07-05: 282 mg via INTRAVENOUS
  Filled 2021-07-05: qty 47

## 2021-07-05 MED ORDER — HEPARIN SOD (PORK) LOCK FLUSH 100 UNIT/ML IV SOLN
INTRAVENOUS | Status: AC
  Administered 2021-07-05: 500 [IU]
  Filled 2021-07-05: qty 5

## 2021-07-05 MED ORDER — PALONOSETRON HCL INJECTION 0.25 MG/5ML
0.2500 mg | Freq: Once | INTRAVENOUS | Status: AC
  Administered 2021-07-05: 0.25 mg via INTRAVENOUS
  Filled 2021-07-05: qty 5

## 2021-07-05 MED ORDER — SODIUM CHLORIDE 0.9 % IV SOLN
Freq: Once | INTRAVENOUS | Status: AC
  Filled 2021-07-05: qty 250

## 2021-07-05 MED ORDER — SODIUM CHLORIDE 0.9 % IV SOLN
361.0000 mg | Freq: Once | INTRAVENOUS | Status: AC
  Administered 2021-07-05: 360 mg via INTRAVENOUS
  Filled 2021-07-05: qty 36

## 2021-07-05 MED ORDER — DIPHENHYDRAMINE HCL 50 MG/ML IJ SOLN
25.0000 mg | Freq: Once | INTRAMUSCULAR | Status: AC
  Administered 2021-07-05: 25 mg via INTRAVENOUS
  Filled 2021-07-05: qty 1

## 2021-07-05 NOTE — Progress Notes (Signed)
ALT is 86 today. Per Dr Grayland Ormond ok to proceed with treatment today. No blood return noted from port. Flushes easily, no swelling or pain noted. Per Dr Grayland Ormond okay to use for treatment today. Dye study scheduled.  Patient tolerated treatment well today. Continued to complain of abdominal pain related to her constipation. Beckey Rutter NP to follow up with patient on 9/29. Patient instructed to take Miralax 2x a day. Patient verbalized understanding.

## 2021-07-05 NOTE — Progress Notes (Signed)
Pt c/o constipation and abdominal pain. Miralax otc with no relief of symptoms. Pt port accessed but no blood return noted.

## 2021-07-05 NOTE — Addendum Note (Signed)
Addended by: Vanice Sarah on: 07/05/2021 01:32 PM   Modules accepted: Orders

## 2021-07-05 NOTE — Progress Notes (Signed)
Nutrition Assessment:  70 year old female with stage IV lung cancer with brain mets.  Past medical history of HLD, HTN.  Patient has completed whole brain radiation. Receiving carbo, taxol, avastin.    Met with patient during infusion.  Patient sleepy during visit. Reports typical day is eating oatmeal for breakfast with coffee or water.  Lunch maybe a salmon patty and mashed potatoes.  Evening maybe applesauce to take evening medication.  Has been drinking 2 boost shakes (30 g protein).  Reports nausea is better when takes medication.  Having issues with constipation.      Medications: reviewed  Labs: reviewed  Anthropometrics:   Weight 126 lb today Height: 63.5 inches 9/5 138 lb  BMI: 22  8% weight loss in the last 3 weeks, significant if accurate   Estimated Energy Needs  Kcals: 1700-2000 Protein: 85-100 g Fluid: 1.7 L  NUTRITION DIAGNOSIS: Inadequate oral intake related to cancer and cancer related treatment side effects as evidenced by 8% weight loss and poor appetite   INTERVENTION:  Discussed strategies to help with constipation. Encouraged boost plus shakes 2-3 times per day Contact information provided    MONITORING, EVALUATION, GOAL: weight trends, intake   NEXT VISIT: Wednesday, October 19 during infusion  Sharmane Dame B. Zenia Resides, [place], [place] Registered Dietitian [phone] (mobile)

## 2021-07-05 NOTE — Progress Notes (Signed)
Met with patient during follow up visit with Dr. Grayland Ormond prior to start chemotherapy. Pt was uncomfortable during visit due to constipation. Encouraged patient to follow MD recommendations to help relieve constipation. Informed niece that will provide bowel regimen instructions on AVS to help follow at home to prevent constipation. Instructed to call back with any questions or needs. Pt and niece verbalized understanding.

## 2021-07-05 NOTE — Patient Instructions (Signed)
[facility]  Discharge Instructions: Thank you for choosing [facility] to provide your oncology and hematology care.  If you have a lab appointment with the [facility], please go directly to the [facility] and check in at the registration area.  Wear comfortable clothing and clothing appropriate for easy access to any Portacath or PICC line.   We strive to give you quality time with your provider. You may need to reschedule your appointment if you arrive late (15 or more minutes).  Arriving late affects you and other patients whose appointments are after yours.  Also, if you miss three or more appointments without notifying the office, you may be dismissed from the clinic at the provider's discretion.      For prescription refill requests, have your pharmacy contact our office and allow 72 hours for refills to be completed.    Today you received the following chemotherapy and/or immunotherapy agents: Taxol, Carboplatin, Zirabev      To help prevent nausea and vomiting after your treatment, we encourage you to take your nausea medication as directed.  BELOW ARE SYMPTOMS THAT SHOULD BE REPORTED IMMEDIATELY: *FEVER GREATER THAN 100.4 F (38 C) OR HIGHER *CHILLS OR SWEATING *NAUSEA AND VOMITING THAT IS NOT CONTROLLED WITH YOUR NAUSEA MEDICATION *UNUSUAL SHORTNESS OF BREATH *UNUSUAL BRUISING OR BLEEDING *URINARY PROBLEMS (pain or burning when urinating, or frequent urination) *BOWEL PROBLEMS (unusual diarrhea, constipation, pain near the anus) TENDERNESS IN MOUTH AND THROAT WITH OR WITHOUT PRESENCE OF ULCERS (sore throat, sores in mouth, or a toothache) UNUSUAL RASH, SWELLING OR PAIN  UNUSUAL VAGINAL DISCHARGE OR ITCHING   Items with * indicate a potential emergency and should be followed up as soon as possible or go to the Emergency Department if any problems should occur.  Please show the CHEMOTHERAPY ALERT CARD or IMMUNOTHERAPY ALERT  CARD at check-in to the Emergency Department and triage nurse.  Should you have questions after your visit or need to cancel or reschedule your appointment, please contact [facility]  [phone] and follow the prompts.  Office hours are 8:00 a.m. to 4:30 p.m. Monday - Friday. Please note that voicemails left after 4:00 p.m. may not be returned until the following business day.  We are closed weekends and major holidays. You have access to a nurse at all times for urgent questions. Please call the main number to the clinic [phone] and follow the prompts.  For any non-urgent questions, you may also contact your provider using MyChart. We now offer e-Visits for anyone 3 and older to request care online for non-urgent symptoms. For details visit [email or web address].   Also download the MyChart app! Go to the app store, search "MyChart", open the app, select [facility], and log in with your MyChart username and password.  Due to Covid, a mask is required upon entering the hospital/clinic. If you do not have a mask, one will be given to you upon arrival. For doctor visits, patients may have 1 support person aged 78 or older with them. For treatment visits, patients cannot have anyone with them due to current Covid guidelines and our immunocompromised population. Bevacizumab injection What is this medication? BEVACIZUMAB (be va SIZ yoo mab) is a monoclonal antibody. It is used to treat many types of cancer. This medicine may be used for other purposes; ask your health care provider or pharmacist if you have questions. COMMON BRAND NAME(S): Avastin, MVASI, Zirabev What should I tell my  care team before I take this medication? They need to know if you have any of these conditions: diabetes heart disease high blood pressure history of coughing up blood prior anthracycline chemotherapy (e.g., doxorubicin, daunorubicin, epirubicin) recent or ongoing radiation  therapy recent or planning to have surgery stroke an unusual or allergic reaction to bevacizumab, hamster proteins, mouse proteins, other medicines, foods, dyes, or preservatives pregnant or trying to get pregnant breast-feeding How should I use this medication? This medicine is for infusion into a vein. It is given by a health care professional in a hospital or clinic setting. Talk to your pediatrician regarding the use of this medicine in children. Special care may be needed. Overdosage: If you think you have taken too much of this medicine contact a poison control center or emergency room at once. NOTE: This medicine is only for you. Do not share this medicine with others. What if I miss a dose? It is important not to miss your dose. Call your doctor or health care professional if you are unable to keep an appointment. What may interact with this medication? Interactions are not expected. This list may not describe all possible interactions. Give your health care provider a list of all the medicines, herbs, non-prescription drugs, or dietary supplements you use. Also tell them if you smoke, drink alcohol, or use illegal drugs. Some items may interact with your medicine. What should I watch for while using this medication? Your condition will be monitored carefully while you are receiving this medicine. You will need important blood work and urine testing done while you are taking this medicine. This medicine may increase your risk to bruise or bleed. Call your doctor or health care professional if you notice any unusual bleeding. Before having surgery, talk to your health care provider to make sure it is ok. This drug can increase the risk of poor healing of your surgical site or wound. You will need to stop this drug for 28 days before surgery. After surgery, wait at least 28 days before restarting this drug. Make sure the surgical site or wound is healed enough before restarting this drug.  Talk to your health care provider if questions. Do not become pregnant while taking this medicine or for 6 months after stopping it. Women should inform their doctor if they wish to become pregnant or think they might be pregnant. There is a potential for serious side effects to an unborn child. Talk to your health care professional or pharmacist for more information. Do not breast-feed an infant while taking this medicine and for 6 months after the last dose. This medicine has caused ovarian failure in some women. This medicine may interfere with the ability to have a child. You should talk to your doctor or health care professional if you are concerned about your fertility. What side effects may I notice from receiving this medication? Side effects that you should report to your doctor or health care professional as soon as possible: allergic reactions like skin rash, itching or hives, swelling of the face, lips, or tongue chest pain or chest tightness chills coughing up blood high fever seizures severe constipation signs and symptoms of bleeding such as bloody or black, tarry stools; red or dark-brown urine; spitting up blood or brown material that looks like coffee grounds; red spots on the skin; unusual bruising or bleeding from the eye, gums, or nose signs and symptoms of a blood clot such as breathing problems; chest pain; severe, sudden headache; pain, swelling,  warmth in the leg signs and symptoms of a stroke like changes in vision; confusion; trouble speaking or understanding; severe headaches; sudden numbness or weakness of the face, arm or leg; trouble walking; dizziness; loss of balance or coordination stomach pain sweating swelling of legs or ankles vomiting weight gain Side effects that usually do not require medical attention (report to your doctor or health care professional if they continue or are bothersome): back pain changes in taste decreased appetite dry  skin nausea tiredness This list may not describe all possible side effects. Call your doctor for medical advice about side effects. You may report side effects to FDA at 1-800-FDA-1088. Where should I keep my medication? This drug is given in a hospital or clinic and will not be stored at home. NOTE: This sheet is a summary. It may not cover all possible information. If you have questions about this medicine, talk to your doctor, pharmacist, or health care provider.  2022 Elsevier/Gold Standard (2019-07-22 10:50:46) Carboplatin injection What is this medication? CARBOPLATIN (KAR boe pla tin) is a chemotherapy drug. It targets fast dividing cells, like cancer cells, and causes these cells to die. This medicine is used to treat ovarian cancer and many other cancers. This medicine may be used for other purposes; ask your health care provider or pharmacist if you have questions. COMMON BRAND NAME(S): Paraplatin What should I tell my care team before I take this medication? They need to know if you have any of these conditions: blood disorders hearing problems kidney disease recent or ongoing radiation therapy an unusual or allergic reaction to carboplatin, cisplatin, other chemotherapy, other medicines, foods, dyes, or preservatives pregnant or trying to get pregnant breast-feeding How should I use this medication? This drug is usually given as an infusion into a vein. It is administered in a hospital or clinic by a specially trained health care professional. Talk to your pediatrician regarding the use of this medicine in children. Special care may be needed. Overdosage: If you think you have taken too much of this medicine contact a poison control center or emergency room at once. NOTE: This medicine is only for you. Do not share this medicine with others. What if I miss a dose? It is important not to miss a dose. Call your doctor or health care professional if you are unable to keep an  appointment. What may interact with this medication? medicines for seizures medicines to increase blood counts like filgrastim, pegfilgrastim, sargramostim some antibiotics like amikacin, gentamicin, neomycin, streptomycin, tobramycin vaccines Talk to your doctor or health care professional before taking any of these medicines: acetaminophen aspirin ibuprofen ketoprofen naproxen This list may not describe all possible interactions. Give your health care provider a list of all the medicines, herbs, non-prescription drugs, or dietary supplements you use. Also tell them if you smoke, drink alcohol, or use illegal drugs. Some items may interact with your medicine. What should I watch for while using this medication? Your condition will be monitored carefully while you are receiving this medicine. You will need important blood work done while you are taking this medicine. This drug may make you feel generally unwell. This is not uncommon, as chemotherapy can affect healthy cells as well as cancer cells. Report any side effects. Continue your course of treatment even though you feel ill unless your doctor tells you to stop. In some cases, you may be given additional medicines to help with side effects. Follow all directions for their use. Call your doctor or health  care professional for advice if you get a fever, chills or sore throat, or other symptoms of a cold or flu. Do not treat yourself. This drug decreases your body's ability to fight infections. Try to avoid being around people who are sick. This medicine may increase your risk to bruise or bleed. Call your doctor or health care professional if you notice any unusual bleeding. Be careful brushing and flossing your teeth or using a toothpick because you may get an infection or bleed more easily. If you have any dental work done, tell your dentist you are receiving this medicine. Avoid taking products that contain aspirin, acetaminophen,  ibuprofen, naproxen, or ketoprofen unless instructed by your doctor. These medicines may hide a fever. Do not become pregnant while taking this medicine. Women should inform their doctor if they wish to become pregnant or think they might be pregnant. There is a potential for serious side effects to an unborn child. Talk to your health care professional or pharmacist for more information. Do not breast-feed an infant while taking this medicine. What side effects may I notice from receiving this medication? Side effects that you should report to your doctor or health care professional as soon as possible: allergic reactions like skin rash, itching or hives, swelling of the face, lips, or tongue signs of infection - fever or chills, cough, sore throat, pain or difficulty passing urine signs of decreased platelets or bleeding - bruising, pinpoint red spots on the skin, black, tarry stools, nosebleeds signs of decreased red blood cells - unusually weak or tired, fainting spells, lightheadedness breathing problems changes in hearing changes in vision chest pain high blood pressure low blood counts - This drug may decrease the number of white blood cells, red blood cells and platelets. You may be at increased risk for infections and bleeding. nausea and vomiting pain, swelling, redness or irritation at the injection site pain, tingling, numbness in the hands or feet problems with balance, talking, walking trouble passing urine or change in the amount of urine Side effects that usually do not require medical attention (report to your doctor or health care professional if they continue or are bothersome): hair loss loss of appetite metallic taste in the mouth or changes in taste This list may not describe all possible side effects. Call your doctor for medical advice about side effects. You may report side effects to FDA at 1-800-FDA-1088. Where should I keep my medication? This drug is given in a  hospital or clinic and will not be stored at home. NOTE: This sheet is a summary. It may not cover all possible information. If you have questions about this medicine, talk to your doctor, pharmacist, or health care provider.  2022 Elsevier/Gold Standard (2007-12-30 14:38:05) Paclitaxel injection What is this medication? PACLITAXEL (PAK li TAX el) is a chemotherapy drug. It targets fast dividing cells, like cancer cells, and causes these cells to die. This medicine is used to treat ovarian cancer, breast cancer, lung cancer, Kaposi's sarcoma, and other cancers. This medicine may be used for other purposes; ask your health care provider or pharmacist if you have questions. COMMON BRAND NAME(S): Onxol, Taxol What should I tell my care team before I take this medication? They need to know if you have any of these conditions: history of irregular heartbeat liver disease low blood counts, like low white cell, platelet, or red cell counts lung or breathing disease, like asthma tingling of the fingers or toes, or other nerve disorder an unusual or  allergic reaction to paclitaxel, alcohol, polyoxyethylated castor oil, other chemotherapy, other medicines, foods, dyes, or preservatives pregnant or trying to get pregnant breast-feeding How should I use this medication? This drug is given as an infusion into a vein. It is administered in a hospital or clinic by a specially trained health care professional. Talk to your pediatrician regarding the use of this medicine in children. Special care may be needed. Overdosage: If you think you have taken too much of this medicine contact a poison control center or emergency room at once. NOTE: This medicine is only for you. Do not share this medicine with others. What if I miss a dose? It is important not to miss your dose. Call your doctor or health care professional if you are unable to keep an appointment. What may interact with this medication? Do not take  this medicine with any of the following medications: live virus vaccines This medicine may also interact with the following medications: antiviral medicines for hepatitis, HIV or AIDS certain antibiotics like erythromycin and clarithromycin certain medicines for fungal infections like ketoconazole and itraconazole certain medicines for seizures like carbamazepine, phenobarbital, phenytoin gemfibrozil nefazodone rifampin St. John's wort This list may not describe all possible interactions. Give your health care provider a list of all the medicines, herbs, non-prescription drugs, or dietary supplements you use. Also tell them if you smoke, drink alcohol, or use illegal drugs. Some items may interact with your medicine. What should I watch for while using this medication? Your condition will be monitored carefully while you are receiving this medicine. You will need important blood work done while you are taking this medicine. This medicine can cause serious allergic reactions. To reduce your risk you will need to take other medicine(s) before treatment with this medicine. If you experience allergic reactions like skin rash, itching or hives, swelling of the face, lips, or tongue, tell your doctor or health care professional right away. In some cases, you may be given additional medicines to help with side effects. Follow all directions for their use. This drug may make you feel generally unwell. This is not uncommon, as chemotherapy can affect healthy cells as well as cancer cells. Report any side effects. Continue your course of treatment even though you feel ill unless your doctor tells you to stop. Call your doctor or health care professional for advice if you get a fever, chills or sore throat, or other symptoms of a cold or flu. Do not treat yourself. This drug decreases your body's ability to fight infections. Try to avoid being around people who are sick. This medicine may increase your risk  to bruise or bleed. Call your doctor or health care professional if you notice any unusual bleeding. Be careful brushing and flossing your teeth or using a toothpick because you may get an infection or bleed more easily. If you have any dental work done, tell your dentist you are receiving this medicine. Avoid taking products that contain aspirin, acetaminophen, ibuprofen, naproxen, or ketoprofen unless instructed by your doctor. These medicines may hide a fever. Do not become pregnant while taking this medicine. Women should inform their doctor if they wish to become pregnant or think they might be pregnant. There is a potential for serious side effects to an unborn child. Talk to your health care professional or pharmacist for more information. Do not breast-feed an infant while taking this medicine. Men are advised not to father a child while receiving this medicine. This product may contain alcohol.  Ask your pharmacist or healthcare provider if this medicine contains alcohol. Be sure to tell all healthcare providers you are taking this medicine. Certain medicines, like metronidazole and disulfiram, can cause an unpleasant reaction when taken with alcohol. The reaction includes flushing, headache, nausea, vomiting, sweating, and increased thirst. The reaction can last from 30 minutes to several hours. What side effects may I notice from receiving this medication? Side effects that you should report to your doctor or health care professional as soon as possible: allergic reactions like skin rash, itching or hives, swelling of the face, lips, or tongue breathing problems changes in vision fast, irregular heartbeat high or low blood pressure mouth sores pain, tingling, numbness in the hands or feet signs of decreased platelets or bleeding - bruising, pinpoint red spots on the skin, black, tarry stools, blood in the urine signs of decreased red blood cells - unusually weak or tired, feeling faint or  lightheaded, falls signs of infection - fever or chills, cough, sore throat, pain or difficulty passing urine signs and symptoms of liver injury like dark yellow or brown urine; general ill feeling or flu-like symptoms; light-colored stools; loss of appetite; nausea; right upper belly pain; unusually weak or tired; yellowing of the eyes or skin swelling of the ankles, feet, hands unusually slow heartbeat Side effects that usually do not require medical attention (report to your doctor or health care professional if they continue or are bothersome): diarrhea hair loss loss of appetite muscle or joint pain nausea, vomiting pain, redness, or irritation at site where injected tiredness This list may not describe all possible side effects. Call your doctor for medical advice about side effects. You may report side effects to FDA at 1-800-FDA-1088. Where should I keep my medication? This drug is given in a hospital or clinic and will not be stored at home. NOTE: This sheet is a summary. It may not cover all possible information. If you have questions about this medicine, talk to your doctor, pharmacist, or health care provider.  2022 Elsevier/Gold Standard (2019-08-26 13:37:23)

## 2021-07-06 ENCOUNTER — Inpatient Hospital Stay (HOSPITAL_BASED_OUTPATIENT_CLINIC_OR_DEPARTMENT_OTHER): Payer: Medicare Other | Admitting: Nurse Practitioner

## 2021-07-06 ENCOUNTER — Telehealth: Payer: Self-pay | Admitting: *Deleted

## 2021-07-06 ENCOUNTER — Telehealth: Payer: Self-pay

## 2021-07-06 DIAGNOSIS — C3490 Malignant neoplasm of unspecified part of unspecified bronchus or lung: Secondary | ICD-10-CM

## 2021-07-06 DIAGNOSIS — K5900 Constipation, unspecified: Secondary | ICD-10-CM

## 2021-07-06 NOTE — Telephone Encounter (Signed)
Telephone call to patient for follow up after receiving first infusion.   Patient niece states infusion went great.  States eating good and drinking plenty of fluids.   Denies any nausea or vomiting but is having a problem with constipation.   Encouraged niece to give patient Mir-a-lax up to 3 times a day.   Encouraged patient to call for any concerns or questions.

## 2021-07-06 NOTE — Telephone Encounter (Signed)
Pt's niece called to get update on palliative care referral. Referral previously placed by pt's PCP. Per ref coordinator at [facility], referral has been received but requested additional referral from Dr. Grayland Ormond as well to keep on pt's chart. Referral placed and [facility] will call pt's niece to set up initial visit.

## 2021-07-06 NOTE — Progress Notes (Signed)
Patient did not answer for virtual visit x2. Will send msg to staff to reschedule.

## 2021-07-07 ENCOUNTER — Other Ambulatory Visit: Payer: Self-pay

## 2021-07-07 ENCOUNTER — Telehealth: Payer: Self-pay | Admitting: Nurse Practitioner

## 2021-07-07 ENCOUNTER — Encounter: Payer: Self-pay | Admitting: Oncology

## 2021-07-07 ENCOUNTER — Inpatient Hospital Stay: Payer: Medicare Other

## 2021-07-07 ENCOUNTER — Ambulatory Visit
Admission: RE | Admit: 2021-07-07 | Discharge: 2021-07-07 | Disposition: A | Discharge status: Home / Self Care | Payer: Medicare Other | Source: Ambulatory Visit | Attending: Oncology | Admitting: Oncology

## 2021-07-07 DIAGNOSIS — T82514A Breakdown (mechanical) of infusion catheter, initial encounter: Secondary | ICD-10-CM | POA: Diagnosis not present

## 2021-07-07 DIAGNOSIS — Z5112 Encounter for antineoplastic immunotherapy: Secondary | ICD-10-CM | POA: Diagnosis not present

## 2021-07-07 DIAGNOSIS — Y848 Other medical procedures as the cause of abnormal reaction of the patient, or of later complication, without mention of misadventure at the time of the procedure: Secondary | ICD-10-CM | POA: Diagnosis not present

## 2021-07-07 DIAGNOSIS — C3411 Malignant neoplasm of upper lobe, right bronchus or lung: Secondary | ICD-10-CM | POA: Insufficient documentation

## 2021-07-07 HISTORY — PX: IR CV LINE INJECTION: IMG2294

## 2021-07-07 MED ORDER — HEPARIN SOD (PORK) LOCK FLUSH 100 UNIT/ML IV SOLN
INTRAVENOUS | Status: AC
  Administered 2021-07-07: 500 [IU]
  Filled 2021-07-07: qty 5

## 2021-07-07 MED ORDER — IOHEXOL 350 MG/ML SOLN
10.0000 mL | Freq: Once | INTRAVENOUS | Status: AC | PRN
  Administered 2021-07-07: 10 mL
  Filled 2021-07-07: qty 10

## 2021-07-07 MED ORDER — PEGFILGRASTIM-BMEZ 6 MG/0.6ML ~~LOC~~ SOSY
6.0000 mg | PREFILLED_SYRINGE | Freq: Once | SUBCUTANEOUS | Status: AC
  Administered 2021-07-07: 6 mg via SUBCUTANEOUS
  Filled 2021-07-07: qty 0.6

## 2021-07-07 NOTE — Telephone Encounter (Signed)
Spoke with patient's niece, Hedwig Morton, regarding the Palliative referral/services and she was in agreement with scheduling visit, niece stated that patient is aware of the referral and was in agreement as well.  I have scheduled an In-home Consult for 07/27/21 @ 9 AM.

## 2021-07-07 NOTE — Telephone Encounter (Signed)
Spoke with patient's niece, Jaynie Collins and she was in agreement with scheduling a Virtual Palliative Consult for 07/11/21 @ 3:30 PM

## 2021-07-10 ENCOUNTER — Encounter: Payer: Self-pay | Admitting: Oncology

## 2021-07-10 NOTE — Progress Notes (Signed)
[place]  [phone] [phone]) [phone]  ID: Sandra Jordan OB: Feb 18, 1951  MR#: [id number]  JAS#:[id number]  Patient Care Team: Marinda Elk, MD as PCP - General (Physician Assistant) Christene Lye, MD as Consulting Physician (General Surgery) Ellamae Sia, MD (Inactive) as Referring Physician (Internal Medicine) Telford Nab, RN as Oncology Nurse Navigator  CHIEF COMPLAINT: Stage IVa adenocarcinoma of the right upper lobe lung with innumerable brain metastasis.  INTERVAL HISTORY: Patient returns to clinic today for further evaluation and to assess her toleration of cycle 1 of carboplatinum, Taxol, and Avastin.  Her constipation has resolved and she no longer has abdominal bloating or tenderness.  She tolerated her treatment well without significant side effects. She has chronic weakness and fatigue.  She has no neurologic complaints.  She denies any recent fevers or illnesses.  Her appetite has improved and she has gained weight in the interim.  She has no chest pain, shortness of breath, cough, or hemoptysis.  She denies any nausea, vomiting, constipation, or diarrhea.  She has no urinary complaints.  Patient offers no further specific complaints today.  REVIEW OF SYSTEMS:   Review of Systems  Constitutional:  Positive for malaise/fatigue. Negative for fever and weight loss.  Respiratory: Negative.  Negative for cough, hemoptysis and shortness of breath.   Cardiovascular: Negative.  Negative for chest pain and leg swelling.  Gastrointestinal: Negative.  Negative for abdominal pain, constipation and nausea.  Genitourinary: Negative.  Negative for dysuria.  Musculoskeletal: Negative.  Negative for back pain.  Skin: Negative.  Negative for rash.  Neurological:  Positive for weakness. Negative for dizziness, focal weakness and headaches.  Psychiatric/Behavioral: Negative.  The patient is not nervous/anxious.    As per HPI.  Otherwise, a complete review of systems is negative.  PAST MEDICAL HISTORY: Past Medical History:  Diagnosis Date   Allergy    Colon polyp    Hyperlipidemia    Hypertension     PAST SURGICAL HISTORY: Past Surgical History:  Procedure Laterality Date   ABDOMINAL HYSTERECTOMY  1984   BREAST BIOPSY Bilateral 1990   fibrocystic breast   BREAST BIOPSY Right August 2014   fibrocystic breast   CHOLECYSTECTOMY  2008   COLONOSCOPY  2012, 2017   [facility]   IR CV LINE INJECTION  07/07/2021   IR IMAGING GUIDED PORT INSERTION  06/20/2021    FAMILY HISTORY: Family History  Problem Relation Age of Onset   Lung cancer Mother    Colon cancer Father 29   Prostate cancer Father    Lung cancer Sister    Lung cancer Maternal Uncle    Stomach cancer Maternal Grandfather    Breast cancer Niece     ADVANCED DIRECTIVES (Y/N):  N  HEALTH MAINTENANCE: Social History   Tobacco Use   Smoking status: Former    Years: 15.00    Types: Cigarettes    Quit date: 10/08/2004    Years since quitting: 16.7   Smokeless tobacco: Never  Substance Use Topics   Alcohol use: No    Alcohol/week: 0.0 standard drinks   Drug use: No     Colonoscopy:  PAP:  Bone density:  Lipid panel:  No Known Allergies  Current Outpatient Medications  Medication Sig Dispense Refill   dexamethasone (DECADRON) 4 MG tablet Take 1 tablet (4 mg total) by mouth 2 (two) times daily with a meal. 60 tablet 1   hydrALAZINE (APRESOLINE) 25 MG tablet Take 25 mg by mouth 2 (two)  times daily.     hydrochlorothiazide (HYDRODIURIL) 25 MG tablet Take 25 mg by mouth daily. Prn if bp above 150/90     lidocaine-prilocaine (EMLA) cream Apply 1 application topically as needed. 30 g 0   lovastatin (MEVACOR) 40 MG tablet SMARTSIG:1 Tablet(s) By Mouth Every Evening     ondansetron (ZOFRAN-ODT) 8 MG disintegrating tablet Take 1 tablet (8 mg total) by mouth every 8 (eight) hours as needed for nausea or vomiting. 60 tablet 0   potassium  chloride 20 MEQ/15ML (10%) SOLN Take 15 mLs (20 mEq total) by mouth 2 (two) times daily. 473 mL 0   cetirizine (ZYRTEC) 10 MG tablet Take 10 mg by mouth daily. (Patient not taking: Reported on 07/13/2021)     cyanocobalamin 1000 MCG tablet Take by mouth. (Patient not taking: No sig reported)     fluticasone (FLONASE) 50 MCG/ACT nasal spray Place into both nostrils. (Patient not taking: No sig reported)     ibuprofen (ADVIL) 800 MG tablet Take 800 mg by mouth every 6 (six) hours as needed. (Patient not taking: No sig reported)     lovastatin (MEVACOR) 20 MG tablet Take 20 mg by mouth at bedtime. (Patient not taking: No sig reported)     meloxicam (MOBIC) 7.5 MG tablet Take 7.5 mg by mouth daily. (Patient not taking: No sig reported)     potassium chloride (KLOR-CON) 10 MEQ tablet Take 10 mEq by mouth daily. (Patient not taking: No sig reported)     prochlorperazine (COMPAZINE) 10 MG tablet Take 1 tablet (10 mg total) by mouth every 6 (six) hours as needed for nausea or vomiting. (Patient not taking: No sig reported) 60 tablet 0   No current facility-administered medications for this visit.   Facility-Administered Medications Ordered in Other Visits  Medication Dose Route Frequency Provider Last Rate Last Admin   heparin lock flush 100 unit/mL  500 Units Intravenous Once Borders, Kirt Boys, NP        OBJECTIVE: Vitals:   07/13/21 0936  BP: 125/87  Pulse: 88  Resp: 16  Temp: (!) 96 F (35.6 C)     Body mass index is 22.51 kg/m.    ECOG FS:0 - Asymptomatic  General: Well-developed, well-nourished, no acute distress. Eyes: Pink conjunctiva, anicteric sclera. HEENT: Normocephalic, moist mucous membranes. Lungs: No audible wheezing or coughing. Heart: Regular rate and rhythm. Abdomen: Soft, nontender, no obvious distention. Musculoskeletal: No edema, cyanosis, or clubbing. Neuro: Alert, answering all questions appropriately. Cranial nerves grossly intact. Skin: No rashes or petechiae  noted. Psych: Normal affect.   LAB RESULTS:  Lab Results  Component Value Date   NA 136 07/13/2021   K 3.8 07/13/2021   CL 95 (L) 07/13/2021   CO2 30 07/13/2021   GLUCOSE 109 (H) 07/13/2021   BUN 17 07/13/2021   CREATININE 0.71 07/13/2021   CALCIUM 9.5 07/13/2021   PROT 6.4 (L) 07/13/2021   ALBUMIN 3.4 (L) 07/13/2021   AST 41 07/13/2021   ALT 104 (H) 07/13/2021   ALKPHOS 160 (H) 07/13/2021   BILITOT 0.2 (L) 07/13/2021   GFRNONAA >60 07/13/2021   GFRAA >60 01/26/2013    Lab Results  Component Value Date   WBC 26.8 (H) 07/13/2021   NEUTROABS 20.6 (H) 07/13/2021   HGB 12.5 07/13/2021   HCT 38.9 07/13/2021   MCV 87.4 07/13/2021   PLT 151 07/13/2021     STUDIES: NM PET Image Initial (PI) Skull Base To Thigh  Result Date: 06/22/2021 CLINICAL DATA:  Initial treatment  strategy for lung nodules. EXAM: NUCLEAR MEDICINE PET SKULL BASE TO THIGH TECHNIQUE: 7.4 mCi F-18 FDG was injected intravenously. Full-ring PET imaging was performed from the skull base to thigh after the radiotracer. CT data was obtained and used for attenuation correction and anatomic localization. Fasting blood glucose: 94 mg/dl COMPARISON:  06/12/2021 FINDINGS: Mediastinal blood pool activity: SUV max 2.8 Liver activity: SUV max NA NECK: Abnormal hypoactivity in a mildly hyperdense 3.4 by 2.2 cm left cerebellar mass on image 4 series 3. 1.6 cm suprasellar mass. 1.0 cm right cerebellar mass on image 8 series 3. The patient has numerous additional known parenchymal masses involving the cerebrum, cerebellum, and brainstem based on the MRI of 06/12/2021, although most of these are too small for accurate PET-CT characterization. Incidental CT findings: none CHEST: Hypermetabolic right upper lobe mass partially surrounded by atelectatic lung, maximum SUV of the mass is 11.2. The dominant hypermetabolic associated activity associated with this mass measures about 4.4 by 3.6 cm, but the mass is felt to be larger and extend  further towards the right hilum based on the recent CT appearance on 06/12/2021. There is truncation and complete obstruction of the right upper lobe bronchus medial to this mass, possibly due to a less hypermetabolic component of the mass or adjacent adenopathy. The overall region of including the mass and surrounding atelectatic lung measures about 6.8 by 6.1 cm on image 62 series 3 and extends down to the left hilum and the truncated left upper lobe bronchus. Substantial bilateral supraclavicular, prevascular, paratracheal, AP window, subcarinal, right hilar, left axillary adenopathy. Index left supraclavicular node 1.9 cm in short axis on image 54 series 3 with maximum SUV 5.1. Index subcarinal node 2.5 cm in short axis on image 83 series 3, maximum SUV 3.5. Index left axillary lymph node 2.3 cm in short axis on image 82 series 3 with maximum SUV 5.0. Index AP window lymph node 2.7 cm in short axis on image 77 series 3 with maximum SUV 3.7. The 2.3 by 1.9 cm right lower lobe nodule on image 88 series 3 has maximum SUV of 1.4, and synchronous low-grade adenocarcinoma cannot be excluded. The other tiny pulmonary nodules in the right lung are too small to characterize. Incidental CT findings: Mild aortic arch atherosclerotic calcification. Small type 1 hiatal hernia. Recently placed Port-A-Cath with some surrounding gas which is normal in the short-term postoperative setting, tip in the SVC. ABDOMEN/PELVIS: No significant abnormal hypermetabolic activity in this region. Incidental CT findings: Atherosclerosis is present, including aortoiliac atherosclerotic disease. Cholecystectomy. Nonobstructive left nephrolithiasis. Scattered colonic diverticula colonic diverticula. SKELETON: No significant abnormal hypermetabolic activity in this region. Incidental left antecubital activity is thought to be injection related. Incidental CT findings: Spondylosis. IMPRESSION: 1. Dominant right upper lobe mass with truncation of  the right upper lobe bronchus, with extensive associated thoracic adenopathy as well as intracranial metastatic lesions. 2. A 2.3 by 1.9 cm somewhat irregular right lower lobe nodule could represent a metastatic lesion or synchronous low-grade adenocarcinoma, but has only low-grade activity with maximum SUV of 1.4. The other tiny pulmonary nodules scattered in the right lung are too small to characterize. 3. No compelling findings of metastatic involvement of the abdomen/pelvis or visualized skeleton. 4. Other imaging findings of potential clinical significance: Aortic Atherosclerosis (ICD10-I70.0). Nonobstructive left nephrolithiasis. Scattered small type 1 hiatal hernia. Electronically Signed   By: Van Clines M.D.   On: 06/22/2021 07:36   IR CV Line Injection  Result Date: 07/07/2021 CLINICAL DATA:  Lung carcinoma  and status post placement left jugular implanted Port-A-Cath on 06/20/2021. There has been difficulty in ability to aspirate blood from the port. EXAM: CONTRAST INJECTION OF PORT A CATH UNDER FLUOROSCOPY CONTRAST:  10 mL Omnipaque 350 FLUOROSCOPY TIME:  1 minute and 30 seconds.  6.8 mGy. PROCEDURE: Contrast was administered via the indwelling port after it was accessed. Fluoroscopic spot images were obtained of the catheter during injection. FINDINGS: The port under fluoroscopy shows slight retraction since placement with the catheter tip now in the upper SVC compared to the lower SVC at the time of placement. The port and attached catheter are intact without evidence of contrast extravasation. Contrast injection demonstrates a prominent fibrin sheath surrounding the distal catheter which is occlusive at the tip of the catheter. Injected contrast material exits a perforation in the superior aspect of the sheath and flows freely in the SVC. IMPRESSION: Prominent fibrin sheath surrounding the distal catheter within the upper SVC. The sheath is occlusive at the tip of the catheter with injected  contrast material exiting a perforation in the superior aspect of the sheath. Injected contrast does flow freely in the SVC. Recommend tPA dwell initially to treat the fibrin sheath. Electronically Signed   By: Aletta Edouard M.D.   On: 07/07/2021 13:17   [place] CORE BIOPSY (LYMPH NODES)  Result Date: 06/20/2021 INDICATION: Lung mass with lymphadenopathy EXAM: ULTRASOUND GUIDED CORE NEEDLE BIOPSY BIOPSY OF RIGHT SUPRACLAVICULAR LYMPH NODE MEDICATIONS: None. ANESTHESIA/SEDATION: None PROCEDURE: The procedure, risks, benefits, and alternatives were explained to the patient. Questions regarding the procedure were encouraged and answered. The patient understands and consents to the procedure. The right supraclavicular region was prepped with chlorhexidine in a sterile fashion, and a sterile drape was applied covering the operative field. A sterile gown and sterile gloves were used for the procedure. Local anesthesia was provided with 1% Lidocaine. Utilizing continuous ultrasound guidance, 6-18 gauge cores were obtained from the enlarged right supraclavicular lymph node. All samples were sent to pathology in sterile saline. COMPLICATIONS: None immediate. IMPRESSION: Ultrasound-guided biopsy of enlarged right supraclavicular lymph node. Electronically Signed   By: Miachel Roux M.D.   On: 06/20/2021 11:54   IR IMAGING GUIDED PORT INSERTION  Result Date: 06/20/2021 INDICATION: Lung mass with metastatic lymphadenopathy EXAM: IMPLANTED PORT A CATH PLACEMENT WITH ULTRASOUND AND FLUOROSCOPIC GUIDANCE MEDICATIONS: None ANESTHESIA/SEDATION: Moderate (conscious) sedation was employed during this procedure. A total of Versed 3 mg and Fentanyl 100 mcg was administered intravenously. Moderate Sedation Time: 27 minutes. The patient's level of consciousness and vital signs were monitored continuously by radiology nursing throughout the procedure under my direct supervision. FLUOROSCOPY TIME:  Two minutes, 48 seconds (15 mGy)  COMPLICATIONS: None immediate. PROCEDURE: The procedure, risks, benefits, and alternatives were explained to the patient. Questions regarding the procedure were encouraged and answered. The patient understands and consents to the procedure. A timeout was performed prior to the initiation of the procedure. Patient positioned supine on the angiography table. Left neck and anterior upper chest prepped and draped in the usual sterile fashion. All elements of maximal sterile barrier were utilized including, cap, mask, sterile gown, sterile gloves, large sterile drape, hand scrubbing and 2% Chlorhexidine for skin cleaning. The left internal jugular vein was evaluated with ultrasound and shown to be patent. A permanent ultrasound image was obtained and placed in the patient's medical record. Local anesthesia was provided with 1% lidocaine with epinephrine. Using sterile gel and a sterile probe cover, the left internal jugular vein was entered with a 21  ga needle during real time ultrasound guidance. 0.018 inch guidewire placed and 21 ga needle exchanged for transitional dilator set. Utilizing fluoroscopy, 0.035 inch guidewire advanced through the needle without difficulty. Attention then turned to the left anterior upper chest. Following local lidocaine administration, a port pocket was created. The catheter was connected to the port and brought from the pocket to the venotomy site through a subcutaneous tunnel. The catheter was cut to size and inserted through the peel-away sheath. The catheter tip was positioned at the cavoatrial junction using fluoroscopic guidance. The port aspirated and flushed well. The port pocket was closed with deep and superficial absorbable suture. The port pocket incision and venotomy sites were also sealed with Dermabond. IMPRESSION: Successful placement of a left internal jugular approach power injectable Port-A-Cath. The catheter is ready for immediate use. Electronically Signed   By:  Miachel Roux M.D.   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  PLAN:    Stage IVa adenocarcinoma of the right upper lobe lung with innumerable brain metastasis: Patient has now completed whole brain XRT.  Patient has had port placement.  PET scan results from June 21, 2021 reviewed independently in part as above with multiple lung metastasis, but no evidence of systemic disease other than patient's known lesions in her brain.  Omnisec testing did not reveal any significant actionable mutations.  Patient received cycle 1 of carboplatinum AUC 5, Taxol 175 mg/m2, and Avastin 15 mg/kg with Udenyca support last week without significant side effects.  Return to clinic in 2 weeks for further evaluation and consideration of cycle 2.  Plan to reimage at the conclusion of cycle 4.   Brain metastasis: Patient has now completed XRT. Nausea, poor appetite: Significantly improved.  Continue Decadron and Zofran as needed. Hypokalemia: Resolved.   Leukocytosis: Secondary to Udenyca. Elevated liver enzymes: Chronic and unchanged, continue to monitor. Constipation: Resolved.  Continue regular bowel regimen as instructed.  Patient expressed understanding and was in agreement with this plan. She also understands that She can call clinic at any time with any questions, concerns, or complaints.   Cancer Staging Primary adenocarcinoma of upper lobe of right lung [facility]) Staging form: Lung, AJCC 8th Edition - Clinical stage from 06/27/2021: Stage IVA (cT3, cN3, pM1b) - Signed by Lloyd Huger, MD on 06/27/2021 Stage prefix: Initial diagnosis  Lloyd Huger, MD   07/13/2021 1:18 PM

## 2021-07-11 ENCOUNTER — Other Ambulatory Visit: Payer: Self-pay

## 2021-07-11 ENCOUNTER — Encounter: Payer: Self-pay | Admitting: Nurse Practitioner

## 2021-07-11 ENCOUNTER — Other Ambulatory Visit: Payer: Medicare Other | Admitting: Nurse Practitioner

## 2021-07-11 DIAGNOSIS — R630 Anorexia: Secondary | ICD-10-CM

## 2021-07-11 DIAGNOSIS — Z515 Encounter for palliative care: Secondary | ICD-10-CM

## 2021-07-11 DIAGNOSIS — R531 Weakness: Secondary | ICD-10-CM

## 2021-07-11 NOTE — Progress Notes (Signed)
[facility] Consult Note Telephone: [phone]  Fax: [phone]   Date of encounter: 07/11/21 6:10 PM PATIENT NAME: Sandra Jordan [id number]   [phone] (home) [phone] (work) DOB: September 12, 1951 MRN: [id number] PRIMARY CARE PROVIDER:    Marinda Elk, MD,  [place] [address] [phone]  RESPONSIBLE PARTY:    Contact Information     Name Relation Home Work Mobile   [place]   [phone]   Sandra, Jordan [phone]     Sandra Jordan   [phone]      Due to the COVID-19 crisis, this visit was done via telemedicine from my office and it was initiated and consent by this patient and or family.  I connected with  Sandra Jordan and  Sandra Jordan (Jordan) on 07/11/21 by a video enabled telemedicine application and verified that I am speaking with the correct person.   I discussed the limitations of evaluation and management by telemedicine. The patient expressed understanding and agreed to proceed.  Palliative Care was asked to follow this patient by consultation request of  Sandra Elk, MD to address advance care planning and complex medical decision making. This is the initial visit.                            ASSESSMENT AND PLAN / RECOMMENDATIONS: Symptom Management/Plan: 1. Advance Care Planning;   2. Goals of Care: Goals include to maximize quality of life and symptom management. Our advance care planning conversation included a discussion about:    The value and importance of advance care planning  Exploration of personal, cultural or spiritual beliefs that might influence medical decisions  Exploration of goals of care in the event of a sudden injury or illness  Identification and preparation of a healthcare agent  Review and updating or creation of an advance directive document.  3.  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 discussed nutrition, continue to snack, supplements, monitor weights; encourage comfort foods; may consider remeron  4. Generalized weakness secondary to Stage IVa adenocarcinoma of the right upper lobe lung with innumerable brain metastasis.chemotherapy; continue to rest when able, sleep hygiene; we talked about strengthening, fall risk  5. Palliative care encounter; Palliative care encounter; Palliative medicine team will continue to support patient, patient's family, and medical team. Visit consisted of counseling and education dealing with the complex and emotionally intense issues of symptom management and palliative care in the setting of serious and potentially life-threatening illness  6. f/u 1 week for ongoing monitoring chronic disease progression, ongoing discussions complex medical decision making  Follow up Palliative Care Visit: Palliative care will continue to follow for complex medical decision making, advance care planning, and clarification of goals. Return 1 weeks or prn.  I spent 62 minutes providing this consultation. More than 50% of the time in this consultation was spent in counseling and care coordination. PPS: 50%  HOSPICE ELIGIBILITY/DIAGNOSIS: TBD  Chief Complaint: Initial palliative consult for complex medical decision making  HISTORY OF PRESENT ILLNESS:  Sandra Jordan is a 69 y.o. year old female  with multiple medical problems including Stage IVa adenocarcinoma of the right upper lobe lung with innumerable brain metastasis, HTN, HLD, h/o colon polyp, allergy. Sandra Jordan was dx Stage IVa adenocarcinoma of the right upper lobe lung with innumerable brain  metastasis, completed whole brain XRT. PET scan with multiple lung metastasis, no evidence of systemic disease aside from lesions in brain.   Plan to initiate chemotherapy with carboplatinum AUC 5, Taxol  175 mg per metered squared, and Avastin 15 mg/kg unless molecular studies indicate alternative treatment.  Patient also require Udenyca support.  Proceed with cycle 1 of treatment 07/05/2021 which she received, tolerated. Palliative care consult for complex medical decision making. Telemedicine by video with Sandra Jordan and her [name]. We talked about purpose of palliative consult. We talked about past medical history, life review. Sandra Jordan has a mentally ill brother with whom she lives with and is his primary caregiver. Sandra Jordan had a sister who passed. Sandra Jordan sisters nieces help her with her appointments, goc, decision making. We talked about dx cancer with metastasis, recent completion of radiation with initiation of chemotherapy. We talked about how she has been feeling, weak, fatigue. We talked about importance of resting, sleep patterns, sleep hygiene. We talked about appetite which is declined. We talked about nutrition, supplements. We talked about possible appetite stimulant. We talked about medical goals. Sandra Jordan Jordan endorses she and Sandra Jordan are hoping with palliative can help with resources. Discussed PC RN and PC SW. We talked about PC Social Worker referral for options for resources, long term planning for herself and brother, care needs planning for the future.  We talked about PC consult for Sandra Jordan brother. Sandra Jordan both in agreement. Will request consult. Will complete PC SW consult. We talked about importance of long term planning, care needs currently and realistic expectations for the future. We talked about chemotherapy, side effects, prognosis which Sandra. Jordan endorses she was not sure of. We talked about symptoms, currently comfortable. Sandra. Jordan endorses at times she does get confused about things including medications. Sandra Jordan and Sandra. Jordan in agreement to have PC RN make a visit to help sort medications. In the meantime Sandra Jordan has been fixing her medications, then calling her when it is time to take them, staying on the phone until it is completed. Sandra Jordan endorses there are times she says she takes them and when she visits she finds the medications in the box. We talked about quality of life, medical goals, will visit code status at next pc visit. We talked about role pc in poc. We talked about f/u pc visit, scheduled. Therapeutic listening, emotional support provided. Questions answered.   History obtained from review of EMR, discussion with Sandra Jordan (NieceMs. Jordan.  I reviewed available labs, medications, imaging, studies and related documents from the EMR.  Records reviewed and summarized above.   ROS Full 10 system review of systems performed and negative with exception of: as per HPI.   Physical Exam: deferred CURRENT PROBLEM LIST:  Patient Active Problem List   Diagnosis Date Noted   Primary adenocarcinoma of upper lobe of right lung ([place]) 06/27/2021   Brain mass 06/12/2021   Mass of upper lobe of right lung 06/12/2021   Hypokalemia 06/12/2021   Hyperlipidemia 03/08/2017   Hypertension 03/08/2017   AR (allergic rhinitis) 05/17/2016   Colon polyps 05/17/2016   Fibrocystic breast changes, bilateral 05/11/2014   PAST MEDICAL HISTORY:  Active Ambulatory Problems    Diagnosis Date Noted   Fibrocystic breast changes, bilateral 05/11/2014   AR (allergic rhinitis) 05/17/2016   Colon polyps 05/17/2016   Hyperlipidemia 03/08/2017   Hypertension 03/08/2017   Brain mass 06/12/2021   Mass  of upper lobe of right lung 06/12/2021   Hypokalemia 06/12/2021   Primary adenocarcinoma of upper lobe of right lung ([place]) 06/27/2021   Resolved Ambulatory Problems    Diagnosis Date Noted   Lump or mass in breast 05/25/2013   Past Medical History:  Diagnosis Date   Allergy    Colon polyp    SOCIAL HX:  Social History   Tobacco Use   Smoking status: Former    Years: 15.00    Types:  Cigarettes    Quit date: 10/08/2004    Years since quitting: 16.7   Smokeless tobacco: Never  Substance Use Topics   Alcohol use: No    Alcohol/week: 0.0 standard drinks   FAMILY HX:  Family History  Problem Relation Age of Onset   Lung cancer Mother    Colon cancer Father 23   Prostate cancer Father    Lung cancer Sister    Lung cancer Maternal Uncle    Stomach cancer Maternal Grandfather    Breast cancer Jordan     reviewed  ALLERGIES: No Known Allergies   PERTINENT MEDICATIONS:  Outpatient Encounter Medications as of 07/11/2021  Medication Sig   cetirizine (ZYRTEC) 10 MG tablet Take 10 mg by mouth daily.   cyanocobalamin 1000 MCG tablet Take by mouth. (Patient not taking: No sig reported)   dexamethasone (DECADRON) 4 MG tablet Take 1 tablet (4 mg total) by mouth 2 (two) times daily with a meal.   fluticasone (FLONASE) 50 MCG/ACT nasal spray Place into both nostrils. (Patient not taking: Reported on 07/05/2021)   hydrALAZINE (APRESOLINE) 25 MG tablet Take 25 mg by mouth 2 (two) times daily.   hydrochlorothiazide (HYDRODIURIL) 25 MG tablet Take 25 mg by mouth daily. Prn if bp above 150/90   ibuprofen (ADVIL) 800 MG tablet Take 800 mg by mouth every 6 (six) hours as needed. (Patient not taking: No sig reported)   lidocaine-prilocaine (EMLA) cream Apply 1 application topically as needed.   lovastatin (MEVACOR) 20 MG tablet Take 20 mg by mouth at bedtime. (Patient not taking: No sig reported)   lovastatin (MEVACOR) 40 MG tablet SMARTSIG:1 Tablet(s) By Mouth Every Evening   meloxicam (MOBIC) 7.5 MG tablet Take 7.5 mg by mouth daily. (Patient not taking: No sig reported)   ondansetron (ZOFRAN-ODT) 8 MG disintegrating tablet Take 1 tablet (8 mg total) by mouth every 8 (eight) hours as needed for nausea or vomiting.   potassium chloride (KLOR-CON) 10 MEQ tablet Take 10 mEq by mouth daily. (Patient not taking: No sig reported)   potassium chloride 20 MEQ/15ML (10%) SOLN Take 15 mLs (20 mEq  total) by mouth 2 (two) times daily.   prochlorperazine (COMPAZINE) 10 MG tablet Take 1 tablet (10 mg total) by mouth every 6 (six) hours as needed for nausea or vomiting. (Patient not taking: No sig reported)   Facility-Administered Encounter Medications as of 07/11/2021  Medication   heparin lock flush 100 unit/mL  Questions and concerns were addressed. The patient/family was encouraged to call with questions and/or concerns. My contact information was provided. Provided general support and encouragement, no other unmet needs identified   Thank you for the opportunity to participate in the care of Sandra. Jeschke.  The palliative care team will continue to follow. Please call our office at [phone] if we can be of additional assistance.   This chart was dictated using voice recognition software.  Despite best efforts to proofread,  errors can occur which can change the documentation meaning.  Ahren Pettinger Z Bemnet Trovato, NP ,

## 2021-07-12 ENCOUNTER — Inpatient Hospital Stay: Payer: Medicare Other

## 2021-07-12 ENCOUNTER — Encounter: Payer: Self-pay | Admitting: Oncology

## 2021-07-12 ENCOUNTER — Inpatient Hospital Stay: Payer: Medicare Other | Admitting: Oncology

## 2021-07-13 ENCOUNTER — Inpatient Hospital Stay: Payer: Medicare Other

## 2021-07-13 ENCOUNTER — Encounter: Payer: Self-pay | Admitting: *Deleted

## 2021-07-13 ENCOUNTER — Inpatient Hospital Stay: Payer: Medicare Other | Attending: Hospice and Palliative Medicine

## 2021-07-13 ENCOUNTER — Inpatient Hospital Stay (HOSPITAL_BASED_OUTPATIENT_CLINIC_OR_DEPARTMENT_OTHER): Payer: Medicare Other | Admitting: Oncology

## 2021-07-13 ENCOUNTER — Encounter: Payer: Self-pay | Admitting: Oncology

## 2021-07-13 VITALS — BP 125/87 | HR 88 | Temp 96.0°F | Resp 16 | Wt 129.1 lb

## 2021-07-13 DIAGNOSIS — C3411 Malignant neoplasm of upper lobe, right bronchus or lung: Secondary | ICD-10-CM | POA: Insufficient documentation

## 2021-07-13 DIAGNOSIS — Z5189 Encounter for other specified aftercare: Secondary | ICD-10-CM | POA: Diagnosis not present

## 2021-07-13 DIAGNOSIS — E876 Hypokalemia: Secondary | ICD-10-CM | POA: Diagnosis not present

## 2021-07-13 DIAGNOSIS — C7931 Secondary malignant neoplasm of brain: Secondary | ICD-10-CM | POA: Diagnosis not present

## 2021-07-13 DIAGNOSIS — Z803 Family history of malignant neoplasm of breast: Secondary | ICD-10-CM | POA: Diagnosis not present

## 2021-07-13 DIAGNOSIS — Z7952 Long term (current) use of systemic steroids: Secondary | ICD-10-CM | POA: Insufficient documentation

## 2021-07-13 DIAGNOSIS — R630 Anorexia: Secondary | ICD-10-CM | POA: Diagnosis not present

## 2021-07-13 DIAGNOSIS — Z8042 Family history of malignant neoplasm of prostate: Secondary | ICD-10-CM | POA: Insufficient documentation

## 2021-07-13 DIAGNOSIS — Z79899 Other long term (current) drug therapy: Secondary | ICD-10-CM | POA: Insufficient documentation

## 2021-07-13 DIAGNOSIS — Z9049 Acquired absence of other specified parts of digestive tract: Secondary | ICD-10-CM | POA: Diagnosis not present

## 2021-07-13 DIAGNOSIS — Z23 Encounter for immunization: Secondary | ICD-10-CM | POA: Insufficient documentation

## 2021-07-13 DIAGNOSIS — R590 Localized enlarged lymph nodes: Secondary | ICD-10-CM | POA: Insufficient documentation

## 2021-07-13 DIAGNOSIS — Z5112 Encounter for antineoplastic immunotherapy: Secondary | ICD-10-CM | POA: Insufficient documentation

## 2021-07-13 DIAGNOSIS — N200 Calculus of kidney: Secondary | ICD-10-CM | POA: Diagnosis not present

## 2021-07-13 DIAGNOSIS — Z801 Family history of malignant neoplasm of trachea, bronchus and lung: Secondary | ICD-10-CM | POA: Diagnosis not present

## 2021-07-13 DIAGNOSIS — Z5111 Encounter for antineoplastic chemotherapy: Secondary | ICD-10-CM | POA: Diagnosis present

## 2021-07-13 DIAGNOSIS — Z923 Personal history of irradiation: Secondary | ICD-10-CM | POA: Insufficient documentation

## 2021-07-13 DIAGNOSIS — Z8719 Personal history of other diseases of the digestive system: Secondary | ICD-10-CM | POA: Diagnosis not present

## 2021-07-13 DIAGNOSIS — Z800 Family history of malignant neoplasm of digestive organs: Secondary | ICD-10-CM | POA: Insufficient documentation

## 2021-07-13 DIAGNOSIS — D72829 Elevated white blood cell count, unspecified: Secondary | ICD-10-CM | POA: Insufficient documentation

## 2021-07-13 DIAGNOSIS — R748 Abnormal levels of other serum enzymes: Secondary | ICD-10-CM | POA: Diagnosis not present

## 2021-07-13 DIAGNOSIS — G939 Disorder of brain, unspecified: Secondary | ICD-10-CM | POA: Insufficient documentation

## 2021-07-13 DIAGNOSIS — I700 Atherosclerosis of aorta: Secondary | ICD-10-CM | POA: Diagnosis not present

## 2021-07-13 DIAGNOSIS — K449 Diaphragmatic hernia without obstruction or gangrene: Secondary | ICD-10-CM | POA: Insufficient documentation

## 2021-07-13 LAB — CBC WITH DIFFERENTIAL/PLATELET
Abs Immature Granulocytes: 2.86 10*3/uL — ABNORMAL HIGH (ref 0.00–0.07)
Basophils Absolute: 0 10*3/uL (ref 0.0–0.1)
Basophils Relative: 0 %
Eosinophils Absolute: 0.1 10*3/uL (ref 0.0–0.5)
Eosinophils Relative: 0 %
HCT: 38.9 % (ref 36.0–46.0)
Hemoglobin: 12.5 g/dL (ref 12.0–15.0)
Immature Granulocytes: 11 %
Lymphocytes Relative: 7 %
Lymphs Abs: 1.9 10*3/uL (ref 0.7–4.0)
MCH: 28.1 pg (ref 26.0–34.0)
MCHC: 32.1 g/dL (ref 30.0–36.0)
MCV: 87.4 fL (ref 80.0–100.0)
Monocytes Absolute: 1.4 10*3/uL — ABNORMAL HIGH (ref 0.1–1.0)
Monocytes Relative: 5 %
Neutro Abs: 20.6 10*3/uL — ABNORMAL HIGH (ref 1.7–7.7)
Neutrophils Relative %: 77 %
Platelets: 151 10*3/uL (ref 150–400)
RBC: 4.45 MIL/uL (ref 3.87–5.11)
RDW: 15.3 % (ref 11.5–15.5)
Smear Review: NORMAL
WBC: 26.8 10*3/uL — ABNORMAL HIGH (ref 4.0–10.5)
nRBC: 0.1 % (ref 0.0–0.2)

## 2021-07-13 LAB — COMPREHENSIVE METABOLIC PANEL
ALT: 104 U/L — ABNORMAL HIGH (ref 0–44)
AST: 41 U/L (ref 15–41)
Albumin: 3.4 g/dL — ABNORMAL LOW (ref 3.5–5.0)
Alkaline Phosphatase: 160 U/L — ABNORMAL HIGH (ref 38–126)
Anion gap: 11 (ref 5–15)
BUN: 17 mg/dL (ref 8–23)
CO2: 30 mmol/L (ref 22–32)
Calcium: 9.5 mg/dL (ref 8.9–10.3)
Chloride: 95 mmol/L — ABNORMAL LOW (ref 98–111)
Creatinine, Ser: 0.71 mg/dL (ref 0.44–1.00)
GFR, Estimated: >60 mL/min (ref >60)
Glucose, Bld: 109 mg/dL — ABNORMAL HIGH (ref 70–99)
Potassium: 3.8 mmol/L (ref 3.5–5.1)
Sodium: 136 mmol/L (ref 135–145)
Total Bilirubin: 0.2 mg/dL — ABNORMAL LOW (ref 0.3–1.2)
Total Protein: 6.4 g/dL — ABNORMAL LOW (ref 6.5–8.1)

## 2021-07-13 LAB — URINALYSIS, DIPSTICK ONLY
Bilirubin Urine: NEGATIVE
Glucose, UA: NEGATIVE mg/dL
Ketones, ur: NEGATIVE mg/dL
Leukocytes,Ua: NEGATIVE
Nitrite: NEGATIVE
Protein, ur: NEGATIVE mg/dL
Specific Gravity, Urine: 1.001 — ABNORMAL LOW (ref 1.005–1.030)
pH: 7 (ref 5.0–8.0)

## 2021-07-13 NOTE — Progress Notes (Signed)
Appt/treatment plan review. Will follow up at next clinic visit.

## 2021-07-13 NOTE — Progress Notes (Signed)
Patient denies new problems/concerns today.   °

## 2021-07-14 ENCOUNTER — Other Ambulatory Visit: Payer: Self-pay

## 2021-07-14 DIAGNOSIS — C3411 Malignant neoplasm of upper lobe, right bronchus or lung: Secondary | ICD-10-CM

## 2021-07-14 NOTE — Progress Notes (Signed)
Dr. Grayland Ormond has spoken to the IR MD that placed patient's port a cath.  The port needs to be replaced.  New order entered and a new schedule request will be sent to specialty scheduling.

## 2021-07-15 ENCOUNTER — Other Ambulatory Visit: Payer: Self-pay | Admitting: Hospice and Palliative Medicine

## 2021-07-15 ENCOUNTER — Encounter: Payer: Self-pay | Admitting: Hospice and Palliative Medicine

## 2021-07-16 ENCOUNTER — Encounter: Payer: Self-pay | Admitting: Oncology

## 2021-07-16 MED ORDER — ONDANSETRON 8 MG PO TBDP: 8.0000 mg | ORAL_TABLET | Freq: Three times a day (TID) | ORAL | 0 refills | Status: DC | PRN

## 2021-07-17 ENCOUNTER — Other Ambulatory Visit: Payer: Medicare Other | Admitting: Nurse Practitioner

## 2021-07-17 ENCOUNTER — Other Ambulatory Visit: Payer: Self-pay

## 2021-07-17 ENCOUNTER — Encounter: Payer: Self-pay | Admitting: Nurse Practitioner

## 2021-07-17 DIAGNOSIS — R630 Anorexia: Secondary | ICD-10-CM

## 2021-07-17 DIAGNOSIS — G4701 Insomnia due to medical condition: Secondary | ICD-10-CM

## 2021-07-17 DIAGNOSIS — R531 Weakness: Secondary | ICD-10-CM

## 2021-07-17 DIAGNOSIS — Z515 Encounter for palliative care: Secondary | ICD-10-CM

## 2021-07-17 NOTE — Progress Notes (Signed)
Patient is scheduled port placement on 10/17 at 10:30 with a 9:30 arrival.  Patient's niece is aware of appt.

## 2021-07-17 NOTE — Progress Notes (Signed)
  [facility] Consult Note Telephone: [phone]  Fax: [phone]    Date of encounter: 07/17/21 3:12 PM PATIENT NAME: Sandra Jordan [address] [place] [address]   [phone] (home) [phone] (work) DOB: 10/16/1950 MRN: [id number] PRIMARY CARE PROVIDER:    McLaughlin, Miriam K, MD,  [address] [facility] [address] [phone]  RESPONSIBLE PARTY:    Contact Information     Name Relation Home Work Mobile   Sandra Jordan Nephew   [phone]   Sandra Jordan Brother [phone]     Sandra Jordan   [phone]      I met face to face with patient and brother in home. Palliative Care was asked to follow this patient by consultation request of  McLaughlin, Miriam K, MD to address advance care planning and complex medical decision making. This is a follow up visit.                                   ASSESSMENT AND PLAN / RECOMMENDATIONS:  Symptom Management/Plan: 1. Advance Care Planning; Full code; aggressive    2. Insomnia; discussed at length sleep pattern, sleep hygiene, will add remeron which may aid in sleeping/appetite and situational grief.    3. 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 discussed nutrition, continue to snack, supplements, monitor weights; encourage comfort foods; discussed and prescribed remeron which may also help her sleep  Rx: Remeron 15mg qhs; 1 RF; #30   4. Generalized weakness secondary to Stage IVa adenocarcinoma of the right upper lobe lung with innumerable brain metastasis.chemotherapy; continue to rest when able, sleep hygiene; we talked about strengthening, fall risk   5. Palliative care encounter; Palliative care encounter; Palliative medicine team will continue to support patient, patient's family, and medical team. Visit consisted of counseling and education dealing  with the complex and emotionally intense issues of symptom management and palliative care in the setting of serious and potentially life-threatening illness   6. f/u 2 week for ongoing monitoring chronic disease progression, ongoing discussions complex medical decision making  Follow up Palliative Care Visit: Palliative care will continue to follow for complex medical decision making, advance care planning, and clarification of goals. Return 2 weeks or prn.  I spent 63 minutes providing this consultation. More than 50% of the time in this consultation was spent in counseling and care coordination. PPS: 50%  HOSPICE ELIGIBILITY/DIAGNOSIS: TBD  Chief Complaint: Follow up palliative consult for complex medical decision making  HISTORY OF PRESENT ILLNESS:  Sandra Jordan is a 70 y.o. year old female  with multiple medical problems including Stage IVa adenocarcinoma of the right upper lobe lung with innumerable brain metastasis, HTN, HLD, h/o colon polyp, allergy. Sandra Jordan was dx Stage IVa adenocarcinoma of the right upper lobe lung with innumerable brain metastasis, completed whole brain XRT. PET scan with multiple lung metastasis, no evidence of systemic disease aside from lesions in brain.   Plan to initiate chemotherapy with carboplatinum AUC 5, Taxol 175 mg per metered squared, and Avastin 15 mg/kg unless molecular studies indicate alternative treatment.  Patient also require Udenyca support.  Proceed with cycle 1 of treatment 07/05/2021 which she received, tolerated. Saw Dr Finnegan, Oncology 07/13/2021 with plan:  Stage IVa adenocarcinoma of the right upper lobe lung with innumerable brain metastasis: Patient has now completed whole brain XRT.    Patient has had port placement.  PET scan results from June 21, 2021 reviewed independently in part as above with multiple lung metastasis, but no evidence of systemic disease other than patient's known lesions in her brain.  Omnisec testing did not  reveal any significant actionable mutations.  Patient received cycle 1 of carboplatinum AUC 5, Taxol 175 mg/m2, and Avastin 15 mg/kg with Udenyca support last week without significant side effects.  Return to clinic in 2 weeks for further evaluation and consideration of cycle 2.  Plan to reimage at the conclusion of cycle 4.  I called Sandra. Jordan to confirm f/u pc visit and covid screening negative. Sandra. Jordan in agreement. I visited Sandra. Jordan and her brother, Sandra. Jordan in their home. Sandra. Jordan was sitting on her couch with her legs up. Sandra. Jordan and I talked about purpose of pc visit. We talked about how she has been feeling. Sandra. Jordan endorses today is a good day, she washed/folded 2 loads of laundry. We talked about energy conservation, not overdoing things. We talked about symptoms of pain which she is currently denies. We talked about ros with positive anorexia. We talked about nutrition, boost, supplements. We talked about food choices, church members bring food to her and her brother every few days. We talked about option of remeron for appetite stimulant. Sandra. Jordan in agreement. We talked about sleep patterns which Sandra. Jordan has been having trouble sleeping. We talked about sleep hygiene, remeron may also help. We talked about recent Oncology appointment and treatment. We talked about no side effects. We talked about her hair dresser cut her hair very short. We talked about community support group. We talked about medical goals. We talked about f/u visit pc in 2 weeks to see how she is tolerating remeron if improved appetite, sleep. Sandra. Jordan in agreement, scheduled. I called Sandra. Jordan, Jordan, updated palliative consult visit. We talked about medical goals of care, currently a full code with full scope. Sandra. Otilio Jordan in agreement to continue discussions at next pc visit. We talked about current home situation, symptoms, starting remeron with appetite, sleep pattern. We talked role pc in poc. We talked  about home situation with Sandra. Jordan and brother Sandra. Jordan (has an initial pc consult pending). We talked about zofran rx which was sent in. We talked about f/u pc visit. We talked about Oncology visit with Dr Grayland Ormond, [facility] understanding of cancer, prognosis with treatment. Sandra. Otilio Jordan endorses her understanding was palliative not curative. Sandra. Otilio Jordan endorses she believes Sandra. Hunton understanding is that it can be cured. Discussed will further discuss with Sandra. Emel to clarify in a supportive therapeutic mannor. Sandra. Otilio Jordan in agreement.  History obtained from review of EMR, discussion with Sandra. Hewitt.  I reviewed available labs, medications, imaging, studies and related documents from the EMR.  Records reviewed and summarized above.   ROS Full 10 system review of systems performed and negative with exception of: as per HPI.   Physical Exam: Constitutional: NAD General: frail appearing, thin/, pleasant female EYES: lids intact ENMT: oral mucous membranes moist CV: S1S2, RRR Pulmonary: LCTA, no increased work of breathing, no cough, room air Abdomen: normo-active BS + 4 quadrants, soft and non tender MSK: ambulatory Skin: warm and dry Neuro:  + generalized weakness,  +very mild intermit cognitive impairment Psych: non-anxious affect, A and O x 3  Questions and concerns were addressed. The patient/family was encouraged to call with questions and/or concerns. My business card was provided. Provided  general support and encouragement, no other unmet needs identified   Thank you for the opportunity to participate in the care of Sandra. Swim.  The palliative care team will continue to follow. Please call our office at [phone] if we can be of additional assistance.   This chart was dictated using voice recognition software.  Despite best efforts to proofread,  errors can occur which can change the documentation meaning.   Eddison Searls Z Fadel Clason, NP   COVID-19 PATIENT SCREENING TOOL Asked and  negative response unless otherwise noted:   Have you had symptoms of covid, tested positive or been in contact with someone with symptoms/positive test in the past 5-10 days?  NO

## 2021-07-18 ENCOUNTER — Telehealth: Payer: Self-pay

## 2021-07-18 NOTE — Telephone Encounter (Signed)
07/18/21 @9 :50 AM: Palliative care SW outreached patient, per PC NP - C. Gusler, request to discuss counseling with new cancer dx; resources available, future long term planning as she is sole caregiver for her mentally ill brother who resides with her,  planning for when increase in care needs.  Sw introduced self, role and purpose of call. Patient shared that she is doing fine at the moment. Patient shared that she has a strong support system with her family (3 nieces and nephew) as well as her church family. Patient also shared that she has strong faith in God. Patient denied any needs.  SW offered supportive and therapeutic listening for patient and advised her that [facility] SW is available for any needs or support. Psychosocial assessment done. No needs identified at this time. Patient has food, although she does not have an appetite, patients family and friends provide transportation for she and her brother. Patient denied counseling services at this time for new dx of cancer. Patient shared that she or her brother needs anything her nieces and nephew provides support.

## 2021-07-19 ENCOUNTER — Other Ambulatory Visit: Payer: Self-pay | Admitting: Radiology

## 2021-07-20 ENCOUNTER — Telehealth: Payer: Self-pay

## 2021-07-20 NOTE — Telephone Encounter (Signed)
945 am.  Request received from Christin Gusler, NP to follow up with patient in regards to side effects from chemo treatments and medication management.  No answer on cell phone.  Message has been left requesting a call back.

## 2021-07-24 ENCOUNTER — Encounter: Payer: Self-pay | Admitting: Radiology

## 2021-07-24 ENCOUNTER — Ambulatory Visit
Admission: RE | Admit: 2021-07-24 | Discharge: 2021-07-24 | Disposition: A | Discharge status: Home / Self Care | Payer: Medicare Other | Source: Ambulatory Visit | Attending: Oncology | Admitting: Oncology

## 2021-07-24 ENCOUNTER — Other Ambulatory Visit: Payer: Self-pay

## 2021-07-24 DIAGNOSIS — C3490 Malignant neoplasm of unspecified part of unspecified bronchus or lung: Secondary | ICD-10-CM | POA: Insufficient documentation

## 2021-07-24 DIAGNOSIS — Z87891 Personal history of nicotine dependence: Secondary | ICD-10-CM | POA: Insufficient documentation

## 2021-07-24 DIAGNOSIS — C3411 Malignant neoplasm of upper lobe, right bronchus or lung: Secondary | ICD-10-CM

## 2021-07-24 DIAGNOSIS — Z79899 Other long term (current) drug therapy: Secondary | ICD-10-CM | POA: Diagnosis not present

## 2021-07-24 HISTORY — PX: IR IMAGING GUIDED PORT INSERTION: IMG5740

## 2021-07-24 HISTORY — DX: Malignant neoplasm of unspecified part of unspecified bronchus or lung: C34.90

## 2021-07-24 MED ORDER — FENTANYL CITRATE (PF) 100 MCG/2ML IJ SOLN
INTRAMUSCULAR | Status: AC
  Filled 2021-07-24: qty 2

## 2021-07-24 MED ORDER — LIDOCAINE-EPINEPHRINE 1 %-1:100000 IJ SOLN
INTRAMUSCULAR | Status: AC
  Filled 2021-07-24: qty 1

## 2021-07-24 MED ORDER — SODIUM CHLORIDE 0.9 % IV SOLN
INTRAVENOUS | Status: DC
  Filled 2021-07-24: qty 1000

## 2021-07-24 MED ORDER — HEPARIN SOD (PORK) LOCK FLUSH 100 UNIT/ML IV SOLN
INTRAVENOUS | Status: AC
  Administered 2021-07-24: 500 [IU]
  Filled 2021-07-24: qty 5

## 2021-07-24 MED ORDER — LIDOCAINE-EPINEPHRINE 1 %-1:100000 IJ SOLN
INTRAMUSCULAR | Status: AC
  Administered 2021-07-24: 30 mL
  Filled 2021-07-24: qty 1

## 2021-07-24 MED ORDER — FENTANYL CITRATE (PF) 100 MCG/2ML IJ SOLN
INTRAMUSCULAR | Status: DC | PRN
  Administered 2021-07-24 (×3): 25 ug via INTRAVENOUS

## 2021-07-24 MED ORDER — MIDAZOLAM HCL 2 MG/2ML IJ SOLN
INTRAMUSCULAR | Status: DC | PRN
  Administered 2021-07-24 (×3): .5 mg via INTRAVENOUS

## 2021-07-24 MED ORDER — MIDAZOLAM HCL 2 MG/2ML IJ SOLN
INTRAMUSCULAR | Status: AC
  Filled 2021-07-24: qty 2

## 2021-07-24 NOTE — H&P (Signed)
Chief Complaint: Patient was seen in consultation today for lung cancer and malfunctioning port at the request of Finnegan,Timothy J  Referring Physician(s): Finnegan,Timothy J  Supervising Physician: Ruthann Cancer  Patient Status: [facility] - Out-pt  History of Present Illness: Sandra Jordan is a 70 y.o. female with PMHx significant for stage IVa adenocarcinoma of the right upper lung mass with metastatic disease who is undergoing chemotherapy only. She is known to our service s/p left sided IJ port a catheter placed 9/13 and right supraclavicular lymph node biopsy 9/13. The patient recently underwent a port a catheter contrast injection secondary to inability to aspirate and imaging revealed a fibrin sheath. The patient is scheduled today for revision. After discussion with Dr. Grayland Ormond there is no plan for radiation therefore it was decided today we will proceed with left sided port a catheter removal followed by right sided IJ port a catheter placement. The patient follows with Dr. Grayland Ormond and was seen 07/13/21.   The patient has had a H&P performed within the last 30 days, all history, medications, and exam have been reviewed. The patient denies any interval changes since the H&P.  The patient denies any chest pain, shortness of breath or palpitations. The patient denies any recent infections. The patient denies any history of sleep apnea or chronic oxygen use. She has no known complications to sedation.    Past Medical History:  Diagnosis Date   Allergy    Colon polyp    Hyperlipidemia    Hypertension    Lung cancer [facility])     Past Surgical History:  Procedure Laterality Date   ABDOMINAL HYSTERECTOMY  1984   BREAST BIOPSY Bilateral 1990   fibrocystic breast   BREAST BIOPSY Right August 2014   fibrocystic breast   CHOLECYSTECTOMY  2008   COLONOSCOPY  2012, 2017   [facility]   IR CV LINE INJECTION  07/07/2021   IR IMAGING GUIDED PORT INSERTION  06/20/2021     Allergies: Patient has no known allergies.  Medications: Prior to Admission medications   Medication Sig Start Date End Date Taking? Authorizing Provider  dexamethasone (DECADRON) 4 MG tablet Take 1 tablet (4 mg total) by mouth 2 (two) times daily with a meal. 06/13/21  Yes Danford, Suann Larry, MD  hydrALAZINE (APRESOLINE) 25 MG tablet Take 25 mg by mouth 2 (two) times daily. 06/07/21  Yes [provider]  lidocaine-prilocaine (EMLA) cream Apply 1 application topically as needed. 06/26/21  Yes Borders, Kirt Boys, NP  lovastatin (MEVACOR) 40 MG tablet SMARTSIG:1 Tablet(s) By Mouth Every Evening 04/14/21  Yes [provider]  mirtazapine (REMERON) 15 MG tablet Take 15 mg by mouth at bedtime.   Yes [provider]  ondansetron (ZOFRAN-ODT) 8 MG disintegrating tablet Take 1 tablet (8 mg total) by mouth every 8 (eight) hours as needed for nausea or vomiting. 07/16/21  Yes Burns, Wandra Feinstein, NP  potassium chloride 20 MEQ/15ML (10%) SOLN Take 15 mLs (20 mEq total) by mouth 2 (two) times daily. 06/26/21  Yes Borders, Kirt Boys, NP  cetirizine (ZYRTEC) 10 MG tablet Take 10 mg by mouth daily. Patient not taking: Reported on 07/13/2021    [provider]  cyanocobalamin 1000 MCG tablet Take by mouth. Patient not taking: No sig reported    [provider]  fluticasone (FLONASE) 50 MCG/ACT nasal spray Place into both nostrils. Patient not taking: No sig reported 06/07/21   [provider]  hydrochlorothiazide (HYDRODIURIL) 25 MG tablet Take 25 mg by  mouth daily. Prn if bp above 150/90    [provider]  ibuprofen (ADVIL) 800 MG tablet Take 800 mg by mouth every 6 (six) hours as needed. Patient not taking: No sig reported 03/29/21   [provider]  lovastatin (MEVACOR) 20 MG tablet Take 20 mg by mouth at bedtime.    [provider]  meloxicam (MOBIC) 7.5 MG tablet Take 7.5 mg by mouth daily. Patient not taking: No sig reported  06/12/21   [provider]  potassium chloride (KLOR-CON) 10 MEQ tablet Take 10 mEq by mouth daily. Patient not taking: No sig reported 06/07/21   [provider]  prochlorperazine (COMPAZINE) 10 MG tablet Take 1 tablet (10 mg total) by mouth every 6 (six) hours as needed for nausea or vomiting. Patient not taking: No sig reported 06/26/21   Borders, Kirt Boys, NP     Family History  Problem Relation Age of Onset   Lung cancer Mother    Colon cancer Father 41   Prostate cancer Father    Lung cancer Sister    Lung cancer Maternal Uncle    Stomach cancer Maternal Grandfather    Breast cancer Niece     Social History   Socioeconomic History   Marital status: Single    Spouse name: Not on file   Number of children: Not on file   Years of education: Not on file   Highest education level: Not on file  Occupational History   Not on file  Tobacco Use   Smoking status: Former    Years: 15.00    Types: Cigarettes    Quit date: 10/08/2004    Years since quitting: 16.8   Smokeless tobacco: Never  Substance and Sexual Activity   Alcohol use: No    Alcohol/week: 0.0 standard drinks   Drug use: No   Sexual activity: Not on file  Other Topics Concern   Not on file  Social History Narrative   Not on file   Social Determinants of Health   Financial Resource Strain: Not on file  Food Insecurity: Not on file  Transportation Needs: Not on file  Physical Activity: Not on file  Stress: Not on file  Social Connections: Not on file    Review of Systems: A 12 point ROS discussed and pertinent positives are indicated in the HPI above.  All other systems are negative.  Review of Systems  Vital Signs: BP (!) 139/97   Pulse (!) 106   Resp 12   Ht 5' 3.5" (1.613 m)   Wt 135 lb (61.2 kg)   SpO2 95%   BMI 23.54 kg/m   Physical Exam Constitutional:      Appearance: Normal appearance.  HENT:     Head: Normocephalic and atraumatic.  Cardiovascular:     Rate and  Rhythm: Regular rhythm. Tachycardia present.  Pulmonary:     Effort: Pulmonary effort is normal. No respiratory distress.  Skin:    General: Skin is warm and dry.  Neurological:     Mental Status: She is alert and oriented to person, place, and time.  Left sided port intact.  Imaging: IR CV Line Injection  Result Date: 07/07/2021 CLINICAL DATA:  Lung carcinoma and status post placement left jugular implanted Port-A-Cath on 06/20/2021. There has been difficulty in ability to aspirate blood from the port. EXAM: CONTRAST INJECTION OF PORT A CATH UNDER FLUOROSCOPY CONTRAST:  10 mL Omnipaque 350 FLUOROSCOPY TIME:  1 minute and 30 seconds.  6.8 mGy.  PROCEDURE: Contrast was administered via the indwelling port after it was accessed. Fluoroscopic spot images were obtained of the catheter during injection. FINDINGS: The port under fluoroscopy shows slight retraction since placement with the catheter tip now in the upper SVC compared to the lower SVC at the time of placement. The port and attached catheter are intact without evidence of contrast extravasation. Contrast injection demonstrates a prominent fibrin sheath surrounding the distal catheter which is occlusive at the tip of the catheter. Injected contrast material exits a perforation in the superior aspect of the sheath and flows freely in the SVC. IMPRESSION: Prominent fibrin sheath surrounding the distal catheter within the upper SVC. The sheath is occlusive at the tip of the catheter with injected contrast material exiting a perforation in the superior aspect of the sheath. Injected contrast does flow freely in the SVC. Recommend tPA dwell initially to treat the fibrin sheath. Electronically Signed   By: Aletta Edouard M.D.   On: 07/07/2021 13:17    Labs:  CBC: Recent Labs    06/26/21 1125 06/27/21 1109 07/05/21 0842 07/13/21 0854  WBC 14.8* 16.3* 13.2* 26.8*  HGB 16.2* 14.8 14.2 12.5  HCT 50.2* 45.5 43.8 38.9  PLT 257 244 231 151     BMP: Recent Labs    06/13/21 0446 06/26/21 1125 07/05/21 0842 07/13/21 0854  NA 144 141 136 136  K 4.9 2.9* 4.0 3.8  CL 110 95* 96* 95*  CO2 27 32 29 30  GLUCOSE 126* 114* 123* 109*  BUN 8 23 17 17   CALCIUM 10.0 9.0 9.8 9.5  CREATININE 0.73 0.98 1.02* 0.71  GFRNONAA >60 >60 60* >60    LIVER FUNCTION TESTS: Recent Labs    06/12/21 1056 06/26/21 1125 07/05/21 0842 07/13/21 0854  BILITOT 0.9 0.9 0.6 0.2*  AST 36 64* 30 41  ALT 41 168* 86* 104*  ALKPHOS 100 94 90 160*  PROT 7.7 7.7 7.0 6.4*  ALBUMIN 4.0 3.9 4.0 3.4*    Assessment and Plan: 70 year old female with PMHx significant for stage IVa adenocarcinoma of the right upper lung mass with metastatic disease who is undergoing chemotherapy only. She is known to our service s/p left sided IJ port a catheter placed 9/13 and right supraclavicular lymph node biopsy 9/13. The patient recently underwent a port a catheter contrast injection secondary to inability to aspirate and imaging revealed a fibrin sheath. The patient is scheduled today for revision. After discussion with Dr. Grayland Ormond there is no plan for radiation therefore it was decided today we will proceed with left sided port a catheter removal followed by right sided IJ port a catheter placement.   The patient has been NPO, no blood thinners taken, imaging, labs and vitals have been reviewed.  Risks and benefits of image guided port-a-catheter removal and placement was discussed with the patient including, but not limited to bleeding, infection, damage to adjacent structures, or fibrin sheath development and need for additional procedures.  All of the patient's questions were answered, patient is agreeable to proceed. Consent signed and in chart.  Thank you for this interesting consult.  I greatly enjoyed meeting Orvilla Truett and look forward to participating in their care.  A copy of this report was sent to the requesting provider on this  date.  Electronically Signed: Hedy Jacob, PA-C 07/24/2021, 10:44 AM   I spent a total of 10 Minutes in face to face in clinical consultation, greater than 50% of which was counseling/coordinating care  for lung cancer with malfunctioning port a catheter.

## 2021-07-24 NOTE — Procedures (Signed)
Interventional Radiology Procedure Note  Procedure:  Single Lumen Right Chest Power Port Placement   Removal of indwelling left chest port  Access:  Right external jugular vein  Findings: Catheter tip positioned at cavoatrial junction. Port is ready for immediate use.   Complications: None  EBL: < 10 mL  Recommendations:  - Ok to shower in 24 hours - Do not submerge for 7 days - Routine line care    Ruthann Cancer, MD

## 2021-07-24 NOTE — Progress Notes (Signed)
[place]  Telephone:([phone]) [phone] [phone]  ID: Sandra Jordan OB: [birth year]  MR#: [id number]  FWY#:[id number]  Patient Care Team: Marinda Elk, MD as PCP - General (Physician Assistant) Christene Lye, MD as Consulting Physician (General Surgery) Ellamae Sia, MD (Inactive) as Referring Physician (Internal Medicine) Telford Nab, RN as Oncology Nurse Navigator  CHIEF COMPLAINT: Stage IVa adenocarcinoma of the right upper lobe lung with innumerable brain metastasis.  INTERVAL HISTORY: Patient returns to clinic today for further evaluation and consideration of cycle 2 of carboplatinum, Taxol, and Avastin.  She continues to have chronic weakness and fatigue, but otherwise feels well.  She does not complain of abdominal pain today.   She has no neurologic complaints.  She denies any recent fevers or illnesses.  Her appetite has improved and she has gained weight in the interim.  She has no chest pain, shortness of breath, cough, or hemoptysis.  She denies any nausea, vomiting, constipation, or diarrhea.  She has no urinary complaints.  Patient offers no further specific complaints today.  REVIEW OF SYSTEMS:   Review of Systems  Constitutional:  Positive for malaise/fatigue. Negative for fever and weight loss.  Respiratory: Negative.  Negative for cough, hemoptysis and shortness of breath.   Cardiovascular: Negative.  Negative for chest pain and leg swelling.  Gastrointestinal: Negative.  Negative for abdominal pain, constipation and nausea.  Genitourinary: Negative.  Negative for dysuria.  Musculoskeletal: Negative.  Negative for back pain.  Skin: Negative.  Negative for rash.  Neurological:  Positive for weakness. Negative for dizziness, focal weakness and headaches.  Psychiatric/Behavioral: Negative.  The patient is not nervous/anxious.    As per HPI. Otherwise, a complete review of systems is negative.  PAST MEDICAL  HISTORY: Past Medical History:  Diagnosis Date   Allergy    Colon polyp    Hyperlipidemia    Hypertension    Lung cancer ([place])     PAST SURGICAL HISTORY: Past Surgical History:  Procedure Laterality Date   ABDOMINAL HYSTERECTOMY  1984   BREAST BIOPSY Bilateral 1990   fibrocystic breast   BREAST BIOPSY Right August 2014   fibrocystic breast   CHOLECYSTECTOMY  2008   COLONOSCOPY  2012, 2017   [facility]   IR CV LINE INJECTION  07/07/2021   IR IMAGING GUIDED PORT INSERTION  06/20/2021   IR IMAGING GUIDED PORT INSERTION  07/24/2021    FAMILY HISTORY: Family History  Problem Relation Age of Onset   Lung cancer Mother    Colon cancer Father 18   Prostate cancer Father    Lung cancer Sister    Lung cancer Maternal Uncle    Stomach cancer Maternal Grandfather    Breast cancer Niece     ADVANCED DIRECTIVES (Y/N):  N  HEALTH MAINTENANCE: Social History   Tobacco Use   Smoking status: Former    Years: 15.00    Types: Cigarettes    Quit date: 10/08/2004    Years since quitting: 16.8   Smokeless tobacco: Never  Substance Use Topics   Alcohol use: No    Alcohol/week: 0.0 standard drinks   Drug use: No     Colonoscopy:  PAP:  Bone density:  Lipid panel:  No Known Allergies  Current Outpatient Medications  Medication Sig Dispense Refill   dexamethasone (DECADRON) 4 MG tablet Take 1 tablet (4 mg total) by mouth 2 (two) times daily with a meal. 60 tablet 1   hydrALAZINE (APRESOLINE) 25 MG tablet Take 25  mg by mouth 2 (two) times daily.     hydrochlorothiazide (HYDRODIURIL) 25 MG tablet Take 25 mg by mouth daily. Prn if bp above 150/90     lidocaine-prilocaine (EMLA) cream Apply 1 application topically as needed. 30 g 0   lovastatin (MEVACOR) 20 MG tablet Take 20 mg by mouth at bedtime.     lovastatin (MEVACOR) 40 MG tablet SMARTSIG:1 Tablet(s) By Mouth Every Evening     mirtazapine (REMERON) 15 MG tablet Take 15 mg by mouth at bedtime.     potassium chloride 20 MEQ/15ML  (10%) SOLN Take 15 mLs (20 mEq total) by mouth 2 (two) times daily. 473 mL 0   cetirizine (ZYRTEC) 10 MG tablet Take 10 mg by mouth daily. (Patient not taking: No sig reported)     cyanocobalamin 1000 MCG tablet Take by mouth. (Patient not taking: No sig reported)     fluticasone (FLONASE) 50 MCG/ACT nasal spray Place into both nostrils. (Patient not taking: No sig reported)     ibuprofen (ADVIL) 800 MG tablet Take 800 mg by mouth every 6 (six) hours as needed. (Patient not taking: No sig reported)     meloxicam (MOBIC) 7.5 MG tablet Take 7.5 mg by mouth daily. (Patient not taking: No sig reported)     ondansetron (ZOFRAN-ODT) 8 MG disintegrating tablet Take 1 tablet (8 mg total) by mouth every 8 (eight) hours as needed for nausea or vomiting. (Patient not taking: Reported on 07/26/2021) 60 tablet 0   potassium chloride (KLOR-CON) 10 MEQ tablet Take 10 mEq by mouth daily. (Patient not taking: Reported on 07/26/2021)     prochlorperazine (COMPAZINE) 10 MG tablet Take 1 tablet (10 mg total) by mouth every 6 (six) hours as needed for nausea or vomiting. (Patient not taking: No sig reported) 60 tablet 0   No current facility-administered medications for this visit.   Facility-Administered Medications Ordered in Other Visits  Medication Dose Route Frequency Provider Last Rate Last Admin   heparin lock flush 100 unit/mL  500 Units Intravenous Once Borders, Kirt Boys, NP        OBJECTIVE: Vitals:   07/26/21 0900  BP: (!) 145/94  Pulse: 95  Resp: 20  SpO2: 97%     Body mass index is 22.23 kg/m.    ECOG FS:0 - Asymptomatic  General: Thin, no acute distress.  Sitting in a wheelchair. Eyes: Pink conjunctiva, anicteric sclera. HEENT: Normocephalic, moist mucous membranes. Lungs: No audible wheezing or coughing. Heart: Regular rate and rhythm. Abdomen: Soft, nontender, no obvious distention. Musculoskeletal: No edema, cyanosis, or clubbing. Neuro: Alert, answering all questions appropriately.  Cranial nerves grossly intact. Skin: No rashes or petechiae noted. Psych: Normal affect.   LAB RESULTS:  Lab Results  Component Value Date   NA 128 (L) 07/26/2021   K 2.9 (L) 07/26/2021   CL 89 (L) 07/26/2021   CO2 27 07/26/2021   GLUCOSE 204 (H) 07/26/2021   BUN 15 07/26/2021   CREATININE 0.68 07/26/2021   CALCIUM 8.8 (L) 07/26/2021   PROT 6.4 (L) 07/26/2021   ALBUMIN 3.4 (L) 07/26/2021   AST 111 (H) 07/26/2021   ALT 312 (H) 07/26/2021   ALKPHOS 177 (H) 07/26/2021   BILITOT 0.3 07/26/2021   GFRNONAA >60 07/26/2021   GFRAA >60 01/26/2013    Lab Results  Component Value Date   WBC 22.7 (H) 07/26/2021   NEUTROABS 20.6 (H) 07/26/2021   HGB 11.7 (L) 07/26/2021   HCT 35.4 (L) 07/26/2021   MCV 87.2 07/26/2021  PLT 262 07/26/2021     STUDIES: IR CV Line Injection  Result Date: 07/07/2021 CLINICAL DATA:  Lung carcinoma and status post placement left jugular implanted Port-A-Cath on 06/20/2021. There has been difficulty in ability to aspirate blood from the port. EXAM: CONTRAST INJECTION OF PORT A CATH UNDER FLUOROSCOPY CONTRAST:  10 mL Omnipaque 350 FLUOROSCOPY TIME:  1 minute and 30 seconds.  6.8 mGy. PROCEDURE: Contrast was administered via the indwelling port after it was accessed. Fluoroscopic spot images were obtained of the catheter during injection. FINDINGS: The port under fluoroscopy shows slight retraction since placement with the catheter tip now in the upper SVC compared to the lower SVC at the time of placement. The port and attached catheter are intact without evidence of contrast extravasation. Contrast injection demonstrates a prominent fibrin sheath surrounding the distal catheter which is occlusive at the tip of the catheter. Injected contrast material exits a perforation in the superior aspect of the sheath and flows freely in the SVC. IMPRESSION: Prominent fibrin sheath surrounding the distal catheter within the upper SVC. The sheath is occlusive at the tip of  the catheter with injected contrast material exiting a perforation in the superior aspect of the sheath. Injected contrast does flow freely in the SVC. Recommend tPA dwell initially to treat the fibrin sheath. Electronically Signed   By: Aletta Edouard M.D.   On: 07/07/2021 13:17   IR IMAGING GUIDED PORT INSERTION  Result Date: 07/24/2021 INDICATION: 70 year old female with advanced stage lung cancer with malfunctioning indwelling left IJ port. EXAM: 1. IMPLANTED PORT A CATH PLACEMENT WITH ULTRASOUND AND FLUOROSCOPIC GUIDANCE 2. Port removal. COMPARISON:  07/07/2021 MEDICATIONS: None. ANESTHESIA/SEDATION: Moderate (conscious) sedation was employed during this procedure. A total of Versed 1.5 mg and Fentanyl 75 mcg was administered intravenously. Moderate Sedation Time: 35 minutes. The patient's level of consciousness and vital signs were monitored continuously by radiology nursing throughout the procedure under my direct supervision. CONTRAST:  None FLUOROSCOPY TIME:  0.3 minutes (1.5 mGy) COMPLICATIONS: None immediate. PROCEDURE: The procedure, risks, benefits, and alternatives were explained to the patient. Questions regarding the procedure were encouraged and answered. The patient understands and consents to the procedure. The right neck and chest were prepped with chlorhexidine in a sterile fashion, and a sterile drape was applied covering the operative field. Maximum barrier sterile technique with sterile gowns and gloves were used for the procedure. A timeout was performed prior to the initiation of the procedure. Ultrasound was used to examine the external jugular vein which was compressible and free of internal echoes. There is high-grade stenosis versus occlusion of the central aspect of the right internal jugular vein. A skin marker was used to demarcate the planned venotomy and port pocket incision sites. Local anesthesia was provided to these sites and the subcutaneous tunnel track with 1%  lidocaine with 1:100,000 epinephrine. A small incision was created at the jugular access site and blunt dissection was performed of the subcutaneous tissues. Under ultrasound guidance, the external jugular vein was accessed with a 21 ga micropuncture needle and an 0.018" wire was inserted to the superior vena cava. Real-time ultrasound guidance was utilized for vascular access including the acquisition of a permanent ultrasound image documenting patency of the accessed vessel. A 5 Fr micopuncture set was then used, through which a 0.035" Rosen wire was passed under fluoroscopic guidance into the inferior vena cava. An 8 Fr dilator was then placed over the wire. A subcutaneous port pocket was then created along the upper chest  wall utilizing a combination of sharp and blunt dissection. The pocket was irrigated with sterile saline, packed with gauze, and observed for hemorrhage. A single lumen "ISP" sized power injectable port was chosen for placement. The 8 Fr catheter was tunneled from the port pocket site to the venotomy incision. The port was placed in the pocket. The external catheter was trimmed to appropriate length. The dilator was exchanged for an 8 Fr peel-away sheath under fluoroscopic guidance. The catheter was then placed through the sheath and the sheath was removed. Final catheter positioning was confirmed and documented with a fluoroscopic spot radiograph. The port was accessed with a Huber needle, aspirated, and flushed with heparinized saline. The deep dermal layer of the port pocket incision was closed with interrupted 3-0 Vicryl suture. The skin was opposed with a running subcuticular 4-0 Monocryl suture. Dermabond was then placed over the port pocket and neck incisions. An incision was made overlying the left Port-A-Cath with a #15 scalpel. Utilizing sharp and blunt dissection, the Port-A-Cath was removed completely. The pocked was irrigated with sterile saline. Wound closure was performed with  interrupted 3-0 Vicryl suture. The skin was opposed with a running subcuticular 4-0 Monocryl suture. Dermabond was then placed over the incision and a dressing was placed. The patient tolerated the procedure well without immediate post procedural complication. FINDINGS: After catheter placement, the tip lies within the superior cavoatrial junction. The catheter aspirates and flushes normally and is ready for immediate use. Successful removal of malfunctioning indwelling left IJ port. IMPRESSION: 1. Successful placement of a power injectable Port-A-Cath via the right external jugular vein. The catheter is ready for immediate use. 2. Successful removal of indwelling, malfunctioning left internal jugular port. Ruthann Cancer, MD [facility] Electronically Signed   By: Ruthann Cancer M.D.   On: 07/24/2021 12:19    ASSESSMENT: Stage IVa adenocarcinoma of the right upper lobe lung with innumerable brain metastasis.  PLAN:    Stage IVa adenocarcinoma of the right upper lobe lung with innumerable brain metastasis: Patient has now completed whole brain XRT.  Patient recently under went port replacement.  PET scan results from June 21, 2021 reviewed independently with multiple lung metastasis, but no evidence of systemic disease other than patient's known lesions in her brain.  Omnisec testing did not reveal any significant actionable mutations.  Delay cycle 2 of carboplatinum AUC 5, Taxol 175 mg/m2, and Avastin 15 mg/kg with Udenyca support secondary to transaminitis.  Will get an ultrasound of the liver and patient will return to clinic in 1 week for further evaluation and reconsideration of cycle 2.  Plan to reimage at the conclusion of cycle 4.   Brain metastasis: Patient has now completed XRT. Nausea, poor appetite: Significantly improved.  Continue Decadron and Zofran as needed. Hypokalemia: Significantly worse.  Continue oral potassium  supplementation. Leukocytosis: Chronic and unchanged, monitor. Elevated liver enzymes: AST and ALT are trending up.  Delay treatment and will get ultrasound of liver. 7.   Constipation: Resolved.  Continue regular bowel regimen as instructed.  Patient expressed understanding and was in agreement with this plan. She also understands that She can call clinic at any time with any questions, concerns, or complaints.   Cancer Staging Primary adenocarcinoma of upper lobe of right lung [facility]) Staging form: Lung, AJCC 8th Edition - Clinical stage from 06/27/2021: Stage IVA (cT3, cN3, pM1b) - Signed by Lloyd Huger, MD on 06/27/2021 Stage prefix: Initial diagnosis  Lloyd Huger, MD  07/27/2021 5:53 AM

## 2021-07-26 ENCOUNTER — Inpatient Hospital Stay: Payer: Medicare Other

## 2021-07-26 ENCOUNTER — Other Ambulatory Visit: Payer: Self-pay

## 2021-07-26 ENCOUNTER — Inpatient Hospital Stay (HOSPITAL_BASED_OUTPATIENT_CLINIC_OR_DEPARTMENT_OTHER): Payer: Medicare Other | Admitting: Oncology

## 2021-07-26 ENCOUNTER — Encounter: Payer: Self-pay | Admitting: *Deleted

## 2021-07-26 ENCOUNTER — Other Ambulatory Visit: Payer: Self-pay | Admitting: Emergency Medicine

## 2021-07-26 VITALS — BP 145/94 | HR 95 | Resp 20 | Wt 127.5 lb

## 2021-07-26 DIAGNOSIS — C3411 Malignant neoplasm of upper lobe, right bronchus or lung: Secondary | ICD-10-CM

## 2021-07-26 DIAGNOSIS — Z5112 Encounter for antineoplastic immunotherapy: Secondary | ICD-10-CM | POA: Diagnosis not present

## 2021-07-26 LAB — CBC WITH DIFFERENTIAL/PLATELET
Abs Immature Granulocytes: 0.46 10*3/uL — ABNORMAL HIGH (ref 0.00–0.07)
Basophils Absolute: 0.1 10*3/uL (ref 0.0–0.1)
Basophils Relative: 0 %
Eosinophils Absolute: 0 10*3/uL (ref 0.0–0.5)
Eosinophils Relative: 0 %
HCT: 35.4 % — ABNORMAL LOW (ref 36.0–46.0)
Hemoglobin: 11.7 g/dL — ABNORMAL LOW (ref 12.0–15.0)
Immature Granulocytes: 2 %
Lymphocytes Relative: 5 %
Lymphs Abs: 1.1 10*3/uL (ref 0.7–4.0)
MCH: 28.8 pg (ref 26.0–34.0)
MCHC: 33.1 g/dL (ref 30.0–36.0)
MCV: 87.2 fL (ref 80.0–100.0)
Monocytes Absolute: 0.6 10*3/uL (ref 0.1–1.0)
Monocytes Relative: 3 %
Neutro Abs: 20.6 10*3/uL — ABNORMAL HIGH (ref 1.7–7.7)
Neutrophils Relative %: 90 %
Platelets: 262 10*3/uL (ref 150–400)
RBC: 4.06 MIL/uL (ref 3.87–5.11)
RDW: 16 % — ABNORMAL HIGH (ref 11.5–15.5)
WBC: 22.7 10*3/uL — ABNORMAL HIGH (ref 4.0–10.5)
nRBC: 0.1 % (ref 0.0–0.2)

## 2021-07-26 LAB — COMPREHENSIVE METABOLIC PANEL
ALT: 312 U/L — ABNORMAL HIGH (ref 0–44)
AST: 111 U/L — ABNORMAL HIGH (ref 15–41)
Albumin: 3.4 g/dL — ABNORMAL LOW (ref 3.5–5.0)
Alkaline Phosphatase: 177 U/L — ABNORMAL HIGH (ref 38–126)
Anion gap: 12 (ref 5–15)
BUN: 15 mg/dL (ref 8–23)
CO2: 27 mmol/L (ref 22–32)
Calcium: 8.8 mg/dL — ABNORMAL LOW (ref 8.9–10.3)
Chloride: 89 mmol/L — ABNORMAL LOW (ref 98–111)
Creatinine, Ser: 0.68 mg/dL (ref 0.44–1.00)
GFR, Estimated: >60 mL/min (ref >60)
Glucose, Bld: 204 mg/dL — ABNORMAL HIGH (ref 70–99)
Potassium: 2.9 mmol/L — ABNORMAL LOW (ref 3.5–5.1)
Sodium: 128 mmol/L — ABNORMAL LOW (ref 135–145)
Total Bilirubin: 0.3 mg/dL (ref 0.3–1.2)
Total Protein: 6.4 g/dL — ABNORMAL LOW (ref 6.5–8.1)

## 2021-07-26 LAB — URINALYSIS, DIPSTICK ONLY
Bilirubin Urine: NEGATIVE
Glucose, UA: NEGATIVE mg/dL
Hgb urine dipstick: NEGATIVE
Ketones, ur: NEGATIVE mg/dL
Leukocytes,Ua: NEGATIVE
Nitrite: NEGATIVE
Protein, ur: NEGATIVE mg/dL
Specific Gravity, Urine: 1.002 — ABNORMAL LOW (ref 1.005–1.030)
pH: 7 (ref 5.0–8.0)

## 2021-07-26 MED ORDER — HEPARIN SOD (PORK) LOCK FLUSH 100 UNIT/ML IV SOLN
500.0000 [IU] | Freq: Once | INTRAVENOUS | Status: AC
  Administered 2021-07-26: 500 [IU] via INTRAVENOUS
  Filled 2021-07-26: qty 5

## 2021-07-26 MED ORDER — SODIUM CHLORIDE 0.9% FLUSH
10.0000 mL | Freq: Once | INTRAVENOUS | Status: AC
  Administered 2021-07-26: 10 mL via INTRAVENOUS
  Filled 2021-07-26: qty 10

## 2021-07-26 NOTE — Progress Notes (Signed)
Nutrition  RD planning to see patient during treatment but held today.  Will follow-up at next treatment.  Noralyn Karim B. Zenia Resides, [place], [place] Registered Dietitian [phone] (mobile)

## 2021-07-26 NOTE — Progress Notes (Signed)
Pt has no concerns at this time. 

## 2021-07-26 NOTE — Telephone Encounter (Signed)
Opened in error

## 2021-07-27 ENCOUNTER — Ambulatory Visit
Admission: RE | Admit: 2021-07-27 | Discharge: 2021-07-27 | Disposition: A | Discharge status: Home / Self Care | Payer: Medicare Other | Source: Ambulatory Visit | Attending: Oncology | Admitting: Oncology

## 2021-07-27 ENCOUNTER — Encounter: Payer: Self-pay | Admitting: Oncology

## 2021-07-27 ENCOUNTER — Other Ambulatory Visit: Payer: Medicare Other | Admitting: Nurse Practitioner

## 2021-07-27 ENCOUNTER — Encounter: Payer: Self-pay | Admitting: Nurse Practitioner

## 2021-07-27 DIAGNOSIS — G4701 Insomnia due to medical condition: Secondary | ICD-10-CM

## 2021-07-27 DIAGNOSIS — C3411 Malignant neoplasm of upper lobe, right bronchus or lung: Secondary | ICD-10-CM | POA: Diagnosis present

## 2021-07-27 DIAGNOSIS — R630 Anorexia: Secondary | ICD-10-CM

## 2021-07-27 DIAGNOSIS — R531 Weakness: Secondary | ICD-10-CM

## 2021-07-27 DIAGNOSIS — Z515 Encounter for palliative care: Secondary | ICD-10-CM

## 2021-07-27 NOTE — Progress Notes (Signed)
[facility] Pall[facility] Consult Note Telephone: [phone]  Fax: [phone]    Date of encounter: 07/27/21 8:38 PM PATIENT NAME: [place] [address]   [phone] (home) [phone] (work) DOB: [birth year] MRN: [id number] PRIMARY CARE PROVIDER:    Marinda Elk, MD,  [employer] [place] [address] [phone]  RESPONSIBLE PARTY:    Contact Information     Name Relation Home Work Mobile   [place]   [phone]   [place], [place] [phone]     Hedwig Morton Niece   [phone]      I met face to face with patient and family in home. Palliative Care was asked to follow this patient by consultation request of  Marinda Elk, MD to address advance care planning and complex medical decision making. This is a follow up visit.                                  ASSESSMENT AND PLAN / RECOMMENDATIONS: Symptom Management/Plan: 1. Advance Care Planning; Full code; aggressive    2. Insomnia; discussed at length sleep pattern, sleep hygiene, will add remeron which may aid in sleeping/appetite and situational grief.    3. Anorexia improved with remeron; secondary to stage IVa adenocarcinoma of the right upper lobe lung with innumerable brain metastasis. discussed nutrition, continue to snack, supplements, monitor weights; encourage comfort foods; discussed and prescribed remeron which may also help her sleep. Will continue remeron    4. Generalized weakness secondary to Stage IVa adenocarcinoma of the right upper lobe lung with innumerable brain metastasis.chemotherapy; continue to rest when able, sleep hygiene; we talked about strengthening, fall risk   5. Palliative care encounter; Palliative care encounter; Palliative medicine team will continue to support patient, patient's family, and medical team. Visit consisted of counseling and education  dealing with the complex and emotionally intense issues of symptom management and palliative care in the setting of serious and potentially life-threatening illness  Follow up Palliative Care Visit: Palliative care will continue to follow for complex medical decision making, advance care planning, and clarification of goals. Return 4 weeks or prn.  I spent 62 minutes providing this consultation. More than 50% of the time in this consultation was spent in counseling and care coordination. PPS: 50%  HOSPICE ELIGIBILITY/DIAGNOSIS: TBD  Chief Complaint: Follow up palliative consult for complex medical decision making  HISTORY OF PRESENT ILLNESS:  Keon Benscoter is a 70 y.o. year old female  with multiple medical problems including Stage IVa adenocarcinoma of the right upper lobe lung with innumerable brain metastasis, HTN, HLD, h/o colon polyp, allergy. Ms Picking was dx Stage IVa adenocarcinoma of the right upper lobe lung with innumerable brain metastasis, completed whole brain XRT. PET scan with multiple lung metastasis, no evidence of systemic disease aside from lesions in brain.   Plan to initiate chemotherapy with carboplatinum AUC 5, Taxol 175 mg per metered squared, and Avastin 15 mg/kg unless molecular studies indicate alternative treatment.  Patient also require Udenyca support.  Proceed with cycle 1 of treatment 07/05/2021 which she received, tolerated. Saw Dr Grayland Ormond, Oncology 07/13/2021 with plan:  Stage IVa adenocarcinoma of the right upper lobe lung with innumerable brain metastasis: Patient has now completed whole brain XRT.  Patient has had port placement.  PET scan results from June 21, 2021 reviewed independently in part as above with  multiple lung metastasis, but no evidence of systemic disease other than patient's known lesions in her brain.  Omnisec testing did not reveal any significant actionable mutations.  Patient received cycle 1 of carboplatinum AUC 5, Taxol 175 mg/m2, and  Avastin 15 mg/kg with Udenyca support last week without significant side effects.  Return to clinic in 2 weeks for further evaluation and consideration of cycle 2.  Plan to reimage at the conclusion of cycle 4.  I called Ms. Gavitt to confirm f/u pc visit and covid screening negative. Ms. Imel in agreement. I visited Ms. Gilford Rile, niece Tamela Oddi, and her brother, Mr. Ferrero in their home. We talked about purpose of pc visit. We talked about how she has been feeling. Ms. Mells endorses she had an ultrasound this am and port placed. Ms. Bashor endorses she has been having a good week. Today she is a little tired, but doing well. We talked about symptoms currently pain free. We talked about fatigue and generalized weakness. We talked about we exercises, strengthening, self independence. We talked about appetite. Ms. Kohn endorses the remeron has helped, would like to continue. We talked about medications, how she uses her pill box. We talked about improved sleep with remeron. Sleep patterns, sleep hygiene. We talked about medical goals. We talked about quality of life, coping strategies. We talked about role pc in poc. We talked about f/u pc visit, will also ask PC RN with PC SW. We talked about f/u pc visit with np, scheduled.  Therapeutic listening, emotional support provided. Questions answered.   History obtained from review of EMR, discussion with Niece Tamela Oddi, brother Ackroyd, and Ms. Chisholm.  I reviewed available labs, medications, imaging, studies and related documents from the EMR.  Records reviewed and summarized above.   ROS Full 10 system review of systems performed and negative with exception of: as per HPI.   Physical Exam: Constitutional: NAD General: frail appearing, thin pleasant female EYES: lids intact ENMT:oral mucous membranes moist CV: S1S2, RRR Pulmonary: LCTA, no increased work of breathing, no cough, room air Abdomen: normo-active BS + 4 quadrants, soft and non tender MSK:  ambulatory; muscle wasting Skin: warm and dry Neuro:  + generalized weakness,  +very mild cognitive impairment Psych: non-anxious affect, A and O x 3  Questions and concerns were addressed. The patient/family was encouraged to call with questions and/or concerns. My contact information was provided. Provided general support and encouragement, no other unmet needs identified   Thank you for the opportunity to participate in the care of Ms. Donofrio.  The palliative care team will continue to follow. Please call our office at [phone] if we can be of additional assistance.   This chart was dictated using voice recognition software.  Despite best efforts to proofread,  errors can occur which can change the documentation meaning.   Miciah Covelli Z Idris Edmundson, NP   COVID-19 PATIENT SCREENING TOOL Asked and negative response unless otherwise noted:   Have you had symptoms of covid, tested positive or been in contact with someone with symptoms/positive test in the past 5-10 days?  NO

## 2021-07-28 ENCOUNTER — Telehealth: Payer: Self-pay | Admitting: *Deleted

## 2021-07-28 ENCOUNTER — Other Ambulatory Visit: Payer: Medicare Other

## 2021-07-28 ENCOUNTER — Other Ambulatory Visit: Payer: Self-pay

## 2021-07-28 VITALS — BP 150/100 | HR 90 | Temp 97.7°F

## 2021-07-28 DIAGNOSIS — Z515 Encounter for palliative care: Secondary | ICD-10-CM

## 2021-07-28 MED ORDER — FLUCONAZOLE 100 MG PO TABS
100.0000 mg | ORAL_TABLET | Freq: Every day | ORAL | 0 refills | Status: DC
Start: 2021-07-28 — End: 2021-08-30

## 2021-07-28 NOTE — Telephone Encounter (Signed)
Per palliative care provider, pt has developed yeast in her mouth. Per Dr. Grayland Ormond, will send in fluconazole to take for 5 days. Pt will need to hold lovastatin while taking fluconazole due to drug interaction. Pt to be made aware by palliative care provider.

## 2021-07-28 NOTE — Progress Notes (Addendum)
PATIENT NAME: Sandra Jordan DOB: February 01, 1951 MRN: [id number]  PRIMARY CARE PROVIDER: Marinda Elk, MD  RESPONSIBLE PARTY:  Acct ID - Guarantor Home Phone Work Phone Relationship Acct Type  [id number] Sandra [phone] Self P/F     [place], [place], [place] [address]    PLAN OF CARE and INTERVENTIONS:               1.  GOALS OF CARE/ ADVANCE CARE PLANNING:  Remain home with assistance of her family.               2.  PATIENT/CAREGIVER EDUCATION:  Thrush               4. PERSONAL EMERGENCY PLAN:  Activate 911 for emergencies.               5.  DISEASE STATUS:  Joint visit completed with Sandra Jordan, SW, patient and her brother Sandra Jordan.   Patient found sitting on the sofa resting.  Engages easily in conversation.  Notes her faith as as a source of strength and reports church has been very supportive with weekly visits.   Nieces and nephew are assisted when needed.   Patient reports soreness to mouth.  Assessed oral mucosa and thrush present.  Spoke with Sandra Gusler, NP regarding treatment but she has deferred to oncology as patient is currently receiving chemo treatments and NP is uncertain of interactions with nystatin or diflucan.   Communication sent to [facility], RN and Sandra Jordan has ordered Diflucan but lovastatin will need to be held while patient is taking Diflucan.   Patient is receiving PT through [facility].  Sandra Jordan, PT arrived during this visit and she stated therapy will be discharging next week.  Patient is ambulatory without an assistive devices.  Po intake has been fair to poor.  Patient confirms taking boost 2-3x daily.  She is trying to snack as she is able to.  No issues with nausea or vomiting.  She reports she did have some nausea and took zofran but is currently out.  I asked to see medication bottles and patient obtained them from her room.  Bag of medications was provided.  Patient has a full box of zofran in this bag.  I have  laid the box of zofran out for patient so it is easily accessible.   Blood pressure elevated today.  Medication discrepancy noted on chart and patient's bottle hydrochlorothiazide and hydralazine. I have contacted PCP office requesting a call back for clarification.  Response pending.   Phone call made to niece Sandra Jordan to provide an update on joint visit this am.  Advised that lovastatin needs to be held while patient is taking diflucan.  Niece will pull lovastatin from pill box.  Actively listening provided as niece discusses the complications of caregiving.   211 pm.  Incoming call from Dr. Vertis Kelch office.  Medication clarification received and patient should be taking hydrachlorathiazide 25 mg daily and hydralazine 25 mg as needed for bp > 150/90.   HISTORY OF PRESENT ILLNESS:  70 year old female with Adenocarcinoma of the right lung.  Patient is being followed by Palliative Care every 4-8 weeks and PRN.  CODE STATUS: Full ADVANCED DIRECTIVES: No MOST FORM: No PPS: 50%    PHYSICAL EXAM:   VITALS: Today's Vitals   07/28/21 1151  BP: (!) 150/100  Pulse: 90  Temp: 97.7 F (36.5 C)  SpO2: 97%    [place] 11-12 pm home visit  Telephone call 34 minutes.     Lorenza Burton, RN

## 2021-07-28 NOTE — Progress Notes (Signed)
COMMUNITY PALLIATIVE CARE SW NOTE  PATIENT NAME: Sandra Jordan DOB: 01/12/1951 MRN: [id number]  PRIMARY CARE PROVIDER: Marinda Elk, MD  RESPONSIBLE PARTY:  Acct ID - Guarantor Home Phone Work Phone Relationship Acct Type  [id number] [name] Self P/F     [place], [place], [place] [address]     PLAN OF CARE and INTERVENTIONS:             GOALS OF CARE/ ADVANCE CARE PLANNING:  Patient is a full code. ACP to discussed at next visit. Patient's goals include to maximize quality of life and symptom management and avoid hospitalizations.  2.         SOCIAL/EMOTIONAL/SPIRITUAL ASSESSMENT/ INTERVENTIONS:  SW and RN met with patient in patients home for monthly follow up visit for [facility] NP. Patient lives in a one story home alone. Family is supportive and visits daily. Patient has a caregiver M-F 9am-1pm from [employer]. Caregiver present during visit.  Patient is 70 y.o. year old female with stage IV lung cancer with metastasis to liver. Patient is being followed by oncology, Sandra Jordan. 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 Patient is currently receiving HH PT through [facility], their last visit will be next week.  Mini assessment of the home done, in conversation with [name], who was present during visit. Patient is able to ambulate to around the home independently but has been encouraged to use RW due to leg weakness. Patient is able to maneuver RW throughout home and in bathroom with no issues. Provided education of safely maneuvering around home and taking her time. No falls reported.   Patient share her appetite is poor. Patient consumes boost and other non-nutritional foods, due to those items being all she has a taste for currently. Patient showed PC her mouth during visits, mouth shows present thrush, RN called in symptoms to [facility] NP and oncologist.  No concerns or issues with sleeping.   RN reviewed medications and took vitals.  Patients BP showed high (150/100) during visit. RN clarifying high BP medications and dosages with PCP office. Patients niece, [name], assist in managing patients medications and fills pill box weekly.  Psychosocial assessment completed. No physical needs identified at this time. SW discussed counseling support for patient due to dx of cancer. Patient declined services at this time. Patient appears to be coping well on the surface and shares that she has a lot of support from her church family. Transportation resources discussed as patient nor her brother drive and both depend on family to transport to appointments and grocery shopping. Ongoing support will continued to be offered.  SW discussed goals, reviewed care plan, provided emotional support, used active and reflective listening. Long term planning and goals of care to be discussed again in more detail at next visit with daughter present.   ADDEDD: TC to patient's niece, [name], to update on visit on to share next scheduled visit of 08/11/21 '@11am' .  SW will email niece at [email or web address] resources for transportation, audiologist, and automatic pill box dispenser.  3.         PATIENT/CAREGIVER EDUCATION/ COPING:  Patient A&O x3, able to answer questions appropriately. Patient in good spirits today. Patient denies any anxiety or depression at this time. PHQ 9 completed and indicated 2; which suggest minimal depression and no treatment is required at this time. Patients family and friends are supportive and visit weekly/daily.  4.         PERSONAL EMERGENCY PLAN:  Patient will call 9-1-1 for emergencies.   5.         COMMUNITY RESOURCES COORDINATION/ HEALTH CARE NAVIGATION:  Patient and daughter manages her care.   6.         FINANCIAL/LEGAL CONCERNS/INTERVENTIONS:  None.      SOCIAL HX:  Social History   Tobacco Use   Smoking status: Former    Years: 15.00    Types: Cigarettes    Quit date: 10/08/2004    Years since quitting: 16.8    Smokeless tobacco: Never  Substance Use Topics   Alcohol use: No    Alcohol/week: 0.0 standard drinks    CODE STATUS: FULL CODE ADVANCED DIRECTIVES: N MOST FORM COMPLETE:  N HOSPICE EDUCATION PROVIDED: N  PPS: Patient is independent with all ADL's at this time. Patient is A&O x3 with average insight and judgement.   Time spent: 1 hr   [name], [place]

## 2021-07-28 NOTE — Progress Notes (Signed)
[name]  [phone] Fax:([phone]  ID: Sandra Jordan OB: 19-Feb-1951  MR#: [id number]  CWC#:[id number]  Patient Care Team: Marinda Elk, MD as PCP - General (Physician Assistant) Christene Lye, MD as Consulting Physician (General Surgery) Ellamae Sia, MD (Inactive) as Referring Physician (Internal Medicine) Telford Nab, RN as Oncology Nurse Navigator  CHIEF COMPLAINT: Stage IVa adenocarcinoma of the right upper lobe lung with innumerable brain metastasis.  INTERVAL HISTORY: Patient returns to clinic today for further evaluation and reconsideration of cycle 2 of carboplatinum, Taxol, and Avastin.  She has chronic weakness and fatigue, but otherwise feels well.  She denies any abdominal pain. She has no neurologic complaints.  She denies any recent fevers or illnesses.  Her appetite has improved and she has gained weight in the interim.  She has no chest pain, shortness of breath, cough, or hemoptysis.  She denies any nausea, vomiting, constipation, or diarrhea.  She has no urinary complaints.  Patient offers no further specific complaints today.  REVIEW OF SYSTEMS:   Review of Systems  Constitutional:  Positive for malaise/fatigue. Negative for fever and weight loss.  Respiratory: Negative.  Negative for cough, hemoptysis and shortness of breath.   Cardiovascular: Negative.  Negative for chest pain and leg swelling.  Gastrointestinal: Negative.  Negative for abdominal pain, constipation and nausea.  Genitourinary: Negative.  Negative for dysuria.  Musculoskeletal: Negative.  Negative for back pain.  Skin: Negative.  Negative for rash.  Neurological:  Positive for weakness. Negative for dizziness, focal weakness and headaches.  Psychiatric/Behavioral: Negative.  The patient is not nervous/anxious.    As per HPI. Otherwise, a complete review of systems is negative.  PAST MEDICAL HISTORY: Past Medical History:   Diagnosis Date   Allergy    Colon polyp    Hyperlipidemia    Hypertension    Lung cancer (Sandra Jordan)     PAST SURGICAL HISTORY: Past Surgical History:  Procedure Laterality Date   ABDOMINAL HYSTERECTOMY  1984   BREAST BIOPSY Bilateral 1990   fibrocystic breast   BREAST BIOPSY Right August 2014   fibrocystic breast   CHOLECYSTECTOMY  2008   COLONOSCOPY  2012, 2017   [facility]   IR CV LINE INJECTION  07/07/2021   IR IMAGING GUIDED PORT INSERTION  06/20/2021   IR IMAGING GUIDED PORT INSERTION  07/24/2021    FAMILY HISTORY: Family History  Problem Relation Age of Onset   Lung cancer Mother    Colon cancer Father 63   Prostate cancer Father    Lung cancer Sister    Lung cancer Maternal Uncle    Stomach cancer Maternal Grandfather    Breast cancer Niece     ADVANCED DIRECTIVES (Y/N):  N  HEALTH MAINTENANCE: Social History   Tobacco Use   Smoking status: Former    Years: 15.00    Types: Cigarettes    Quit date: 10/08/2004    Years since quitting: 16.8   Smokeless tobacco: Never  Substance Use Topics   Alcohol use: No    Alcohol/week: 0.0 standard drinks   Drug use: No     Colonoscopy:  PAP:  Bone density:  Lipid panel:  No Known Allergies  Current Outpatient Medications  Medication Sig Dispense Refill   dexamethasone (DECADRON) 4 MG tablet Take 1 tablet (4 mg total) by mouth 2 (two) times daily with a meal. 60 tablet 1   fluconazole (DIFLUCAN) 100 MG tablet Take 1 tablet (100 mg total) by mouth daily.  5 tablet 0   hydrALAZINE (APRESOLINE) 25 MG tablet Take 25 mg by mouth 2 (two) times daily.     hydrochlorothiazide (HYDRODIURIL) 25 MG tablet Take 25 mg by mouth daily. Prn if bp above 150/90     lidocaine-prilocaine (EMLA) cream Apply 1 application topically as needed. 30 g 0   lovastatin (MEVACOR) 20 MG tablet Take 20 mg by mouth at bedtime.     lovastatin (MEVACOR) 40 MG tablet SMARTSIG:1 Tablet(s) By Mouth Every Evening     mirtazapine (REMERON) 15 MG tablet Take  15 mg by mouth at bedtime.     potassium chloride 20 MEQ/15ML (10%) SOLN Take 15 mLs (20 mEq total) by mouth 2 (two) times daily. 473 mL 0   cetirizine (ZYRTEC) 10 MG tablet Take 10 mg by mouth daily. (Patient not taking: No sig reported)     cyanocobalamin 1000 MCG tablet Take by mouth. (Patient not taking: No sig reported)     fluticasone (FLONASE) 50 MCG/ACT nasal spray Place into both nostrils. (Patient not taking: No sig reported)     ibuprofen (ADVIL) 800 MG tablet Take 800 mg by mouth every 6 (six) hours as needed. (Patient not taking: No sig reported)     meloxicam (MOBIC) 7.5 MG tablet Take 7.5 mg by mouth daily. (Patient not taking: No sig reported)     ondansetron (ZOFRAN-ODT) 8 MG disintegrating tablet Take 1 tablet (8 mg total) by mouth every 8 (eight) hours as needed for nausea or vomiting. (Patient not taking: No sig reported) 60 tablet 0   potassium chloride (KLOR-CON) 10 MEQ tablet Take 2 tablets (20 mEq total) by mouth daily. 60 tablet 0   prochlorperazine (COMPAZINE) 10 MG tablet Take 1 tablet (10 mg total) by mouth every 6 (six) hours as needed for nausea or vomiting. (Patient not taking: No sig reported) 60 tablet 0   No current facility-administered medications for this visit.   Facility-Administered Medications Ordered in Other Visits  Medication Dose Route Frequency Provider Last Rate Last Admin   bevacizumab-bvzr (ZIRABEV) 900 mg in sodium chloride 0.9 % 100 mL chemo infusion  15 mg/kg (Treatment Plan Recorded) Intravenous Once Lloyd Huger, MD       CARBOplatin (PARAPLATIN) 360 mg in sodium chloride 0.9 % 250 mL chemo infusion  360 mg Intravenous Once Lloyd Huger, MD       dexamethasone (DECADRON) 10 mg in sodium chloride 0.9 % 50 mL IVPB  10 mg Intravenous Once Lloyd Huger, MD       fosaprepitant (EMEND) 150 mg in sodium chloride 0.9 % 145 mL IVPB  150 mg Intravenous Once Lloyd Huger, MD       heparin lock flush 100 UNIT/ML injection             heparin lock flush 100 unit/mL  500 Units Intravenous Once Borders, Kirt Boys, NP       heparin lock flush 100 unit/mL  500 Units Intravenous Once Lloyd Huger, MD       influenza vaccine adjuvanted (FLUAD) injection 0.5 mL  0.5 mL Intramuscular Once Lloyd Huger, MD       PACLitaxel (TAXOL) 282 mg in sodium chloride 0.9 % 250 mL chemo infusion (> 80mg /m2)  175 mg/m2 (Treatment Plan Recorded) Intravenous Once Lloyd Huger, MD       sodium chloride flush (NS) 0.9 % injection 10 mL  10 mL Intracatheter PRN Lloyd Huger, MD   10 mL at 08/02/21 [phone]  OBJECTIVE: Vitals:   08/02/21 0841  BP: (!) 149/89  Pulse: 91  Resp: 18  Temp: (!) 96.3 F (35.7 C)  SpO2: 100%     Body mass index is 21.92 kg/m.    ECOG FS:0 - Asymptomatic  General: Thin, no acute distress. Eyes: Pink conjunctiva, anicteric sclera. HEENT: Normocephalic, moist mucous membranes. Lungs: No audible wheezing or coughing. Heart: Regular rate and rhythm. Abdomen: Soft, nontender, no obvious distention. Musculoskeletal: No edema, cyanosis, or clubbing. Neuro: Alert, answering all questions appropriately. Cranial nerves grossly intact. Skin: No rashes or petechiae noted. Psych: Normal affect.   LAB RESULTS:  Lab Results  Component Value Date   NA 134 (L) 08/02/2021   K 3.1 (L) 08/02/2021   CL 92 (L) 08/02/2021   CO2 29 08/02/2021   GLUCOSE 124 (H) 08/02/2021   BUN 12 08/02/2021   CREATININE 0.60 08/02/2021   CALCIUM 9.3 08/02/2021   PROT 6.6 08/02/2021   ALBUMIN 3.8 08/02/2021   AST 82 (H) 08/02/2021   ALT 343 (H) 08/02/2021   ALKPHOS 172 (H) 08/02/2021   BILITOT 0.6 08/02/2021   GFRNONAA >60 08/02/2021   GFRAA >60 01/26/2013    Lab Results  Component Value Date   WBC 15.6 (H) 08/02/2021   NEUTROABS 14.0 (H) 08/02/2021   HGB 11.8 (L) 08/02/2021   HCT 35.5 (L) 08/02/2021   MCV 86.8 08/02/2021   PLT 234 08/02/2021     STUDIES: IR CV Line Injection  Result Date:  07/07/2021 CLINICAL DATA:  Lung carcinoma and status post placement left jugular implanted Port-A-Cath on 06/20/2021. There has been difficulty in ability to aspirate blood from the port. EXAM: CONTRAST INJECTION OF PORT A CATH UNDER FLUOROSCOPY CONTRAST:  10 mL Omnipaque 350 FLUOROSCOPY TIME:  1 minute and 30 seconds.  6.8 mGy. PROCEDURE: Contrast was administered via the indwelling port after it was accessed. Fluoroscopic spot images were obtained of the catheter during injection. FINDINGS: The port under fluoroscopy shows slight retraction since placement with the catheter tip now in the upper SVC compared to the lower SVC at the time of placement. The port and attached catheter are intact without evidence of contrast extravasation. Contrast injection demonstrates a prominent fibrin sheath surrounding the distal catheter which is occlusive at the tip of the catheter. Injected contrast material exits a perforation in the superior aspect of the sheath and flows freely in the SVC. IMPRESSION: Prominent fibrin sheath surrounding the distal catheter within the upper SVC. The sheath is occlusive at the tip of the catheter with injected contrast material exiting a perforation in the superior aspect of the sheath. Injected contrast does flow freely in the SVC. Recommend tPA dwell initially to treat the fibrin sheath. Electronically Signed   By: Aletta Edouard M.D.   On: 07/07/2021 13:17   IR IMAGING GUIDED PORT INSERTION  Result Date: 07/24/2021 INDICATION: 70 year old female with advanced stage lung cancer with malfunctioning indwelling left IJ port. EXAM: 1. IMPLANTED PORT A CATH PLACEMENT WITH ULTRASOUND AND FLUOROSCOPIC GUIDANCE 2. Port removal. COMPARISON:  07/07/2021 MEDICATIONS: None. ANESTHESIA/SEDATION: Moderate (conscious) sedation was employed during this procedure. A total of Versed 1.5 mg and Fentanyl 75 mcg was administered intravenously. Moderate Sedation Time: 35 minutes. The patient's level of  consciousness and vital signs were monitored continuously by radiology nursing throughout the procedure under my direct supervision. CONTRAST:  None FLUOROSCOPY TIME:  0.3 minutes (1.5 mGy) COMPLICATIONS: None immediate. PROCEDURE: The procedure, risks, benefits, and alternatives were explained to the patient. Questions regarding the  procedure were encouraged and answered. The patient understands and consents to the procedure. The right neck and chest were prepped with chlorhexidine in a sterile fashion, and a sterile drape was applied covering the operative field. Maximum barrier sterile technique with sterile gowns and gloves were used for the procedure. A timeout was performed prior to the initiation of the procedure. Ultrasound was used to examine the external jugular vein which was compressible and free of internal echoes. There is high-grade stenosis versus occlusion of the central aspect of the right internal jugular vein. A skin marker was used to demarcate the planned venotomy and port pocket incision sites. Local anesthesia was provided to these sites and the subcutaneous tunnel track with 1% lidocaine with 1:100,000 epinephrine. A small incision was created at the jugular access site and blunt dissection was performed of the subcutaneous tissues. Under ultrasound guidance, the external jugular vein was accessed with a 21 ga micropuncture needle and an 0.018" wire was inserted to the superior vena cava. Real-time ultrasound guidance was utilized for vascular access including the acquisition of a permanent ultrasound image documenting patency of the accessed vessel. A 5 Fr micopuncture set was then used, through which a 0.035" Rosen wire was passed under fluoroscopic guidance into the inferior vena cava. An 8 Fr dilator was then placed over the wire. A subcutaneous port pocket was then created along the upper chest wall utilizing a combination of sharp and blunt dissection. The pocket was irrigated with  sterile saline, packed with gauze, and observed for hemorrhage. A single lumen "ISP" sized power injectable port was chosen for placement. The 8 Fr catheter was tunneled from the port pocket site to the venotomy incision. The port was placed in the pocket. The external catheter was trimmed to appropriate length. The dilator was exchanged for an 8 Fr peel-away sheath under fluoroscopic guidance. The catheter was then placed through the sheath and the sheath was removed. Final catheter positioning was confirmed and documented with a fluoroscopic spot radiograph. The port was accessed with a Huber needle, aspirated, and flushed with heparinized saline. The deep dermal layer of the port pocket incision was closed with interrupted 3-0 Vicryl suture. The skin was opposed with a running subcuticular 4-0 Monocryl suture. Dermabond was then placed over the port pocket and neck incisions. An incision was made overlying the left Port-A-Cath with a #15 scalpel. Utilizing sharp and blunt dissection, the Port-A-Cath was removed completely. The pocked was irrigated with sterile saline. Wound closure was performed with interrupted 3-0 Vicryl suture. The skin was opposed with a running subcuticular 4-0 Monocryl suture. Dermabond was then placed over the incision and a dressing was placed. The patient tolerated the procedure well without immediate post procedural complication. FINDINGS: After catheter placement, the tip lies within the superior cavoatrial junction. The catheter aspirates and flushes normally and is ready for immediate use. Successful removal of malfunctioning indwelling left IJ port. IMPRESSION: 1. Successful placement of a power injectable Port-A-Cath via the right external jugular vein. The catheter is ready for immediate use. 2. Successful removal of indwelling, malfunctioning left internal jugular port. Ruthann Cancer, MD [facility] Electronically Signed    By: Ruthann Cancer M.D.   On: 07/24/2021 12:19   US Abdomen Limited RUQ (LIVER/GB)  Result Date: 07/27/2021 CLINICAL DATA:  Elevated liver enzymes EXAM: ULTRASOUND ABDOMEN LIMITED RIGHT UPPER QUADRANT COMPARISON:  CT 06/12/2021 FINDINGS: Gallbladder: Status post cholecystectomy Common bile duct: Diameter: 8.2 mm Liver: Diffusely echogenic. No focal  hepatic abnormality. Portal vein is patent on color Doppler imaging with normal direction of blood flow towards the liver. Other: None. IMPRESSION: 1. Status post cholecystectomy. 2. Echogenic liver consistent with hepatic steatosis and or hepatocellular disease. Electronically Signed   By: Donavan Foil M.D.   On: 07/27/2021 23:47    ASSESSMENT: Stage IVa adenocarcinoma of the right upper lobe lung with innumerable brain metastasis.  PLAN:    Stage IVa adenocarcinoma of the right upper lobe lung with innumerable brain metastasis: Patient has now completed whole brain XRT.  Patient recently under went port replacement.  PET scan results from June 21, 2021 reviewed independently with multiple lung metastasis, but no evidence of systemic disease other than patient's known lesions in her brain.  Omnisec testing did not reveal any significant actionable mutations.  Despite transaminitis, will proceed with cycle 2 of carboplatinum AUC 5, Taxol 175 mg/m2, and Avastin 15 mg/kg with Udenyca support.  Treatment does not need to be dose reduced with liver dysfunction.  Return to clinic in 2 days for Udenyca, 1 week for laboratory work only and then in 3 weeks for further evaluation and consideration of cycle 3.   Plan to reimage at the conclusion of cycle 4.   Brain metastasis: Patient has now completed XRT. Nausea, poor appetite: Significantly improved.  Continue Decadron and Zofran as needed. Hypokalemia: Slowly improving.  Continue oral potassium supplementation. Leukocytosis: Improved. Elevated liver enzymes: AST and ALT are essentially unchanged.   Ultrasound negative for any anatomic abnormalities.  Liver function seems to be adequate with normal bilirubin, PT, and PTT.  LDH is only mildly elevated.  Proceed with treatment as above.  Monitor.   Constipation: Resolved.  Continue regular bowel regimen as instructed.  Patient expressed understanding and was in agreement with this plan. She also understands that She can call clinic at any time with any questions, concerns, or complaints.   Cancer Staging Primary adenocarcinoma of upper lobe of right lung [facility]) Staging form: Lung, AJCC 8th Edition - Clinical stage from 06/27/2021: Stage IVA (cT3, cN3, pM1b) - Signed by Lloyd Huger, MD on 06/27/2021 Stage prefix: Initial diagnosis  Lloyd Huger, MD   08/02/2021 10:41 AM

## 2021-08-01 ENCOUNTER — Telehealth: Payer: Self-pay

## 2021-08-01 NOTE — Telephone Encounter (Signed)
PC SW outreached patient to schedule home visit for Mon 11/7 @1230PM .  SW made Niece, [name], aware as well.

## 2021-08-02 ENCOUNTER — Other Ambulatory Visit: Payer: Self-pay

## 2021-08-02 ENCOUNTER — Inpatient Hospital Stay: Payer: Medicare Other

## 2021-08-02 ENCOUNTER — Inpatient Hospital Stay (HOSPITAL_BASED_OUTPATIENT_CLINIC_OR_DEPARTMENT_OTHER): Payer: Medicare Other | Admitting: Oncology

## 2021-08-02 ENCOUNTER — Encounter: Payer: Self-pay | Admitting: *Deleted

## 2021-08-02 VITALS — BP 134/82 | HR 87 | Resp 20

## 2021-08-02 VITALS — BP 149/89 | HR 91 | Temp 96.3°F | Resp 18 | Wt 125.7 lb

## 2021-08-02 DIAGNOSIS — C3411 Malignant neoplasm of upper lobe, right bronchus or lung: Secondary | ICD-10-CM

## 2021-08-02 DIAGNOSIS — Z95828 Presence of other vascular implants and grafts: Secondary | ICD-10-CM

## 2021-08-02 DIAGNOSIS — Z5112 Encounter for antineoplastic immunotherapy: Secondary | ICD-10-CM | POA: Diagnosis not present

## 2021-08-02 LAB — PROTIME-INR
INR: 1.1 (ref 0.8–1.2)
Prothrombin Time: 13.8 seconds (ref 11.4–15.2)

## 2021-08-02 LAB — COMPREHENSIVE METABOLIC PANEL
ALT: 343 U/L — ABNORMAL HIGH (ref 0–44)
AST: 82 U/L — ABNORMAL HIGH (ref 15–41)
Albumin: 3.8 g/dL (ref 3.5–5.0)
Alkaline Phosphatase: 172 U/L — ABNORMAL HIGH (ref 38–126)
Anion gap: 13 (ref 5–15)
BUN: 12 mg/dL (ref 8–23)
CO2: 29 mmol/L (ref 22–32)
Calcium: 9.3 mg/dL (ref 8.9–10.3)
Chloride: 92 mmol/L — ABNORMAL LOW (ref 98–111)
Creatinine, Ser: 0.6 mg/dL (ref 0.44–1.00)
GFR, Estimated: >60 mL/min (ref >60)
Glucose, Bld: 124 mg/dL — ABNORMAL HIGH (ref 70–99)
Potassium: 3.1 mmol/L — ABNORMAL LOW (ref 3.5–5.1)
Sodium: 134 mmol/L — ABNORMAL LOW (ref 135–145)
Total Bilirubin: 0.6 mg/dL (ref 0.3–1.2)
Total Protein: 6.6 g/dL (ref 6.5–8.1)

## 2021-08-02 LAB — URINALYSIS, DIPSTICK ONLY
Bilirubin Urine: NEGATIVE
Glucose, UA: NEGATIVE mg/dL
Hgb urine dipstick: NEGATIVE
Ketones, ur: NEGATIVE mg/dL
Leukocytes,Ua: NEGATIVE
Nitrite: NEGATIVE
Protein, ur: NEGATIVE mg/dL
Specific Gravity, Urine: 1.001 — ABNORMAL LOW (ref 1.005–1.030)
pH: 9 — ABNORMAL HIGH (ref 5.0–8.0)

## 2021-08-02 LAB — APTT: aPTT: 21 seconds — ABNORMAL LOW (ref 24–36)

## 2021-08-02 LAB — CBC WITH DIFFERENTIAL/PLATELET
Abs Immature Granulocytes: 0.28 10*3/uL — ABNORMAL HIGH (ref 0.00–0.07)
Basophils Absolute: 0 10*3/uL (ref 0.0–0.1)
Basophils Relative: 0 %
Eosinophils Absolute: 0 10*3/uL (ref 0.0–0.5)
Eosinophils Relative: 0 %
HCT: 35.5 % — ABNORMAL LOW (ref 36.0–46.0)
Hemoglobin: 11.8 g/dL — ABNORMAL LOW (ref 12.0–15.0)
Immature Granulocytes: 2 %
Lymphocytes Relative: 5 %
Lymphs Abs: 0.8 10*3/uL (ref 0.7–4.0)
MCH: 28.9 pg (ref 26.0–34.0)
MCHC: 33.2 g/dL (ref 30.0–36.0)
MCV: 86.8 fL (ref 80.0–100.0)
Monocytes Absolute: 0.5 10*3/uL (ref 0.1–1.0)
Monocytes Relative: 3 %
Neutro Abs: 14 10*3/uL — ABNORMAL HIGH (ref 1.7–7.7)
Neutrophils Relative %: 90 %
Platelets: 234 10*3/uL (ref 150–400)
RBC: 4.09 MIL/uL (ref 3.87–5.11)
RDW: 17.6 % — ABNORMAL HIGH (ref 11.5–15.5)
WBC: 15.6 10*3/uL — ABNORMAL HIGH (ref 4.0–10.5)
nRBC: 0 % (ref 0.0–0.2)

## 2021-08-02 LAB — LACTATE DEHYDROGENASE: LDH: 379 U/L — ABNORMAL HIGH (ref 98–192)

## 2021-08-02 MED ORDER — DIPHENHYDRAMINE HCL 50 MG/ML IJ SOLN
25.0000 mg | Freq: Once | INTRAMUSCULAR | Status: AC
  Administered 2021-08-02: 25 mg via INTRAVENOUS
  Filled 2021-08-02: qty 1

## 2021-08-02 MED ORDER — SODIUM CHLORIDE 0.9 % IV SOLN
175.0000 mg/m2 | Freq: Once | INTRAVENOUS | Status: AC
  Administered 2021-08-02: 282 mg via INTRAVENOUS
  Filled 2021-08-02: qty 47

## 2021-08-02 MED ORDER — SODIUM CHLORIDE 0.9 % IV SOLN
15.0000 mg/kg | Freq: Once | INTRAVENOUS | Status: AC
  Administered 2021-08-02: 900 mg via INTRAVENOUS
  Filled 2021-08-02: qty 4

## 2021-08-02 MED ORDER — SODIUM CHLORIDE 0.9% FLUSH
10.0000 mL | INTRAVENOUS | Status: DC | PRN
  Administered 2021-08-02: 10 mL
  Filled 2021-08-02: qty 10

## 2021-08-02 MED ORDER — SODIUM CHLORIDE 0.9 % IV SOLN
360.0000 mg | Freq: Once | INTRAVENOUS | Status: AC
  Administered 2021-08-02: 360 mg via INTRAVENOUS
  Filled 2021-08-02: qty 36

## 2021-08-02 MED ORDER — POTASSIUM CHLORIDE ER 10 MEQ PO TBCR: 20.0000 meq | EXTENDED_RELEASE_TABLET | Freq: Every day | ORAL | 0 refills | Status: DC

## 2021-08-02 MED ORDER — SODIUM CHLORIDE 0.9 % IV SOLN
150.0000 mg | Freq: Once | INTRAVENOUS | Status: AC
  Administered 2021-08-02: 150 mg via INTRAVENOUS
  Filled 2021-08-02: qty 150

## 2021-08-02 MED ORDER — SODIUM CHLORIDE 0.9 % IV SOLN
10.0000 mg | Freq: Once | INTRAVENOUS | Status: AC
  Administered 2021-08-02: 10 mg via INTRAVENOUS
  Filled 2021-08-02: qty 10

## 2021-08-02 MED ORDER — SODIUM CHLORIDE 0.9 % IV SOLN
Freq: Once | INTRAVENOUS | Status: AC
  Filled 2021-08-02: qty 250

## 2021-08-02 MED ORDER — INFLUENZA VAC A&B SA ADJ QUAD 0.5 ML IM PRSY
0.5000 mL | PREFILLED_SYRINGE | Freq: Once | INTRAMUSCULAR | Status: AC
  Administered 2021-08-02: 0.5 mL via INTRAMUSCULAR
  Filled 2021-08-02: qty 0.5

## 2021-08-02 MED ORDER — HEPARIN SOD (PORK) LOCK FLUSH 100 UNIT/ML IV SOLN
500.0000 [IU] | Freq: Once | INTRAVENOUS | Status: AC
  Filled 2021-08-02: qty 5

## 2021-08-02 MED ORDER — FAMOTIDINE 20 MG IN NS 100 ML IVPB
20.0000 mg | Freq: Once | INTRAVENOUS | Status: AC
  Administered 2021-08-02: 20 mg via INTRAVENOUS
  Filled 2021-08-02: qty 20

## 2021-08-02 MED ORDER — SODIUM CHLORIDE 0.9% FLUSH
10.0000 mL | Freq: Once | INTRAVENOUS | Status: AC
  Administered 2021-08-02: 10 mL via INTRAVENOUS
  Filled 2021-08-02: qty 10

## 2021-08-02 MED ORDER — HEPARIN SOD (PORK) LOCK FLUSH 100 UNIT/ML IV SOLN
INTRAVENOUS | Status: AC
  Administered 2021-08-02: 500 [IU] via INTRAVENOUS
  Filled 2021-08-02: qty 5

## 2021-08-02 MED ORDER — PALONOSETRON HCL INJECTION 0.25 MG/5ML
0.2500 mg | Freq: Once | INTRAVENOUS | Status: AC
  Administered 2021-08-02: 0.25 mg via INTRAVENOUS
  Filled 2021-08-02: qty 5

## 2021-08-02 NOTE — Progress Notes (Signed)
Nutrition Follow-up:   Patient with stage IV lung cancer with brain mets.  Patient has completed whole brain radiation.  Receiving carbo, taxol and avastin.  Met with patient during infusion.  Patient reports that appetite is some better after receiving treatment for yeast in her mouth.  Patient reports that greens, pasta, potato salad, pinto beans and vegetable soup have been tasting good to her.  Has been drinking ensure/boost shakes.     Medications: reviewed  Labs: reviewed  Anthropometrics:   Weight 125 lb 11.2 oz today  126 lb on 9/28  NUTRITION DIAGNOSIS: Inadequate oral intake continues   INTERVENTION:  Complimentary case of ensure enlive given to patient today.  Encouraged patient to continue drinking shakes Discussed snacks for patient to add to diet.  Handout provided    MONITORING, EVALUATION, GOAL: weight trends, intake   NEXT VISIT: Wednesday, Nov 16 during infusion  Mickey Hebel B. Zenia Resides, [place], [place] Registered Dietitian [phone] (mobile)

## 2021-08-02 NOTE — Progress Notes (Signed)
Potassium: 3.1 today. MD, Dr. Grayland Ormond, aware. MD is calling in new prescription for Potassium Chloride 10 mEq tablets PO with instructions to take 2 tablets (20 mEq total) by mouth daily. Per MD order: patient educated and instructed to continue taking Potassium Chloride until she returns to clinic in 3 weeks on 08/23/2021. Patient verbalized understanding.  1326- Patient asked staff to remove bandage covering where previous left chest implanted port was removed. Staff removed bandage and noted there is a small opening present at the old port incision site on patient's left chest where implanted port was removed. Patient reports left chest implanted port was removed last week and she had a new right chest implanted port placed. Patient reports the area surrounding left chest incision site is tender and staff notes a small opening at incision site. MD, Dr. Grayland Ormond, and NP, Faythe Casa, notified and aware. NP, Faythe Casa, is coming to evaluate patient at chair side.  1333- NP, Faythe Casa, at chair side to evaluate patient. NP, Faythe Casa, advised patient on care of site at home. Patient verbalized understanding.

## 2021-08-02 NOTE — Progress Notes (Signed)
Met patient in the lobby with her niece. Pt did not have any questions or needs at this time. Informed pt to call if needs anything. Pt verbalized understanding.

## 2021-08-02 NOTE — Progress Notes (Signed)
Pt states she received mammogram reminder in mail and was wondering if it was time for it. Pt also wanting to know when she can get her flu shot.

## 2021-08-02 NOTE — Patient Instructions (Signed)
[facility]   Discharge Instructions: Thank you for choosing [facility] to provide your oncology and hematology care.  If you have a lab appointment with the [facility], please go directly to the [facility] and check in at the registration area.  Wear comfortable clothing and clothing appropriate for easy access to any Portacath or PICC line.   We strive to give you quality time with your provider. You may need to reschedule your appointment if you arrive late (15 or more minutes).  Arriving late affects you and other patients whose appointments are after yours.  Also, if you miss three or more appointments without notifying the office, you may be dismissed from the clinic at the provider's discretion.      For prescription refill requests, have your pharmacy contact our office and allow 72 hours for refills to be completed.    Today you received the following chemotherapy and/or immunotherapy agents: Bevacizumab-bvzr Noah Charon), Taxol, and Carboplatin.   To help prevent nausea and vomiting after your treatment, we encourage you to take your nausea medication as directed.  BELOW ARE SYMPTOMS THAT SHOULD BE REPORTED IMMEDIATELY: *FEVER GREATER THAN 100.4 F (38 C) OR HIGHER *CHILLS OR SWEATING *NAUSEA AND VOMITING THAT IS NOT CONTROLLED WITH YOUR NAUSEA MEDICATION *UNUSUAL SHORTNESS OF BREATH *UNUSUAL BRUISING OR BLEEDING *URINARY PROBLEMS (pain or burning when urinating, or frequent urination) *BOWEL PROBLEMS (unusual diarrhea, constipation, pain near the anus) TENDERNESS IN MOUTH AND THROAT WITH OR WITHOUT PRESENCE OF ULCERS (sore throat, sores in mouth, or a toothache) UNUSUAL RASH, SWELLING OR PAIN  UNUSUAL VAGINAL DISCHARGE OR ITCHING   Items with * indicate a potential emergency and should be followed up as soon as possible or go to the Emergency Department if any problems should occur.  Please show the CHEMOTHERAPY ALERT CARD  or IMMUNOTHERAPY ALERT CARD at check-in to the Emergency Department and triage nurse.  Should you have questions after your visit or need to cancel or reschedule your appointment, please contact [facility]  [phone] and follow the prompts.  Office hours are 8:00 a.m. to 4:30 p.m. Monday - Friday. Please note that voicemails left after 4:00 p.m. may not be returned until the following business day.  We are closed weekends and major holidays. You have access to a nurse at all times for urgent questions. Please call the main number to the clinic [phone] and follow the prompts.  For any non-urgent questions, you may also contact your provider using MyChart. We now offer e-Visits for anyone 69 and older to request care online for non-urgent symptoms. For details visit [email or web address].   Also download the MyChart app! Go to the app store, search "MyChart", open the app, select [facility], and log in with your MyChart username and password.  Due to Covid, a mask is required upon entering the hospital/clinic. If you do not have a mask, one will be given to you upon arrival. For doctor visits, patients may have 1 support person aged 69 or older with them. For treatment visits, patients cannot have anyone with them due to current Covid guidelines and our immunocompromised population.

## 2021-08-04 ENCOUNTER — Telehealth: Payer: Self-pay

## 2021-08-04 ENCOUNTER — Other Ambulatory Visit: Payer: Self-pay

## 2021-08-04 ENCOUNTER — Inpatient Hospital Stay: Payer: Medicare Other

## 2021-08-04 DIAGNOSIS — Z5112 Encounter for antineoplastic immunotherapy: Secondary | ICD-10-CM | POA: Diagnosis not present

## 2021-08-04 DIAGNOSIS — C3411 Malignant neoplasm of upper lobe, right bronchus or lung: Secondary | ICD-10-CM

## 2021-08-04 MED ORDER — PEGFILGRASTIM-BMEZ 6 MG/0.6ML ~~LOC~~ SOSY
6.0000 mg | PREFILLED_SYRINGE | Freq: Once | SUBCUTANEOUS | Status: AC
  Administered 2021-08-04: 6 mg via SUBCUTANEOUS
  Filled 2021-08-04: qty 0.6

## 2021-08-04 NOTE — Telephone Encounter (Signed)
PC SW returned patients niece, Sandra Jordan, [place]. Niece shared concern about patients functional changes and requesting a sooner in home visit.  Niece stated that patient had changed/declined slightly since PC visit stating that patient has fell twice since our visit and has become more forgetful, also had issues with medications. Niece shared that she had been trying to stay away from patient due to her not feeling well this well and decided to go there today to refill patients pill box and realized that patient had not taken her medications for 2 days.   Niece express caregiver stress,as patient expects for niece to visit daily and be the only family member that can manage her pill box. Niece shares that she feels patient is not always truthful with he medical team about how she is feeling and her needs, as patient could benefit from a consistent caregiver that can provide assistance to patient 2-3x a week, due to patients cognition seeming to decline as well as her physical functioning.   SW and niece discussed Medicaid, niece to obtain patients monthly income to see if she would qualify for community medicaid. Niece to also inquire with oncology SW about resources for cancer patients as well.   At end of call niece shared that she felt PC visit for 11/7 @1230  will be fine and she will outreach PC if needed before then.

## 2021-08-09 ENCOUNTER — Other Ambulatory Visit: Payer: Self-pay

## 2021-08-09 ENCOUNTER — Ambulatory Visit
Admission: RE | Admit: 2021-08-09 | Discharge: 2021-08-09 | Disposition: A | Discharge status: Home / Self Care | Payer: Medicare Other | Source: Ambulatory Visit | Attending: Radiation Oncology | Admitting: Radiation Oncology

## 2021-08-09 ENCOUNTER — Inpatient Hospital Stay: Payer: Medicare Other | Attending: Radiation Oncology

## 2021-08-09 VITALS — BP 136/95 | HR 109 | Temp 98.4°F | Resp 16 | Wt 120.0 lb

## 2021-08-09 DIAGNOSIS — H538 Other visual disturbances: Secondary | ICD-10-CM | POA: Insufficient documentation

## 2021-08-09 DIAGNOSIS — R519 Headache, unspecified: Secondary | ICD-10-CM | POA: Insufficient documentation

## 2021-08-09 DIAGNOSIS — Z8042 Family history of malignant neoplasm of prostate: Secondary | ICD-10-CM | POA: Diagnosis not present

## 2021-08-09 DIAGNOSIS — Z800 Family history of malignant neoplasm of digestive organs: Secondary | ICD-10-CM | POA: Diagnosis not present

## 2021-08-09 DIAGNOSIS — R54 Age-related physical debility: Secondary | ICD-10-CM | POA: Insufficient documentation

## 2021-08-09 DIAGNOSIS — Z79899 Other long term (current) drug therapy: Secondary | ICD-10-CM | POA: Insufficient documentation

## 2021-08-09 DIAGNOSIS — D72829 Elevated white blood cell count, unspecified: Secondary | ICD-10-CM | POA: Insufficient documentation

## 2021-08-09 DIAGNOSIS — Z87891 Personal history of nicotine dependence: Secondary | ICD-10-CM | POA: Insufficient documentation

## 2021-08-09 DIAGNOSIS — Z5112 Encounter for antineoplastic immunotherapy: Secondary | ICD-10-CM | POA: Insufficient documentation

## 2021-08-09 DIAGNOSIS — R5383 Other fatigue: Secondary | ICD-10-CM | POA: Diagnosis not present

## 2021-08-09 DIAGNOSIS — C3411 Malignant neoplasm of upper lobe, right bronchus or lung: Secondary | ICD-10-CM | POA: Insufficient documentation

## 2021-08-09 DIAGNOSIS — Z5111 Encounter for antineoplastic chemotherapy: Secondary | ICD-10-CM | POA: Diagnosis present

## 2021-08-09 DIAGNOSIS — R531 Weakness: Secondary | ICD-10-CM | POA: Diagnosis not present

## 2021-08-09 DIAGNOSIS — Z803 Family history of malignant neoplasm of breast: Secondary | ICD-10-CM | POA: Diagnosis not present

## 2021-08-09 DIAGNOSIS — E876 Hypokalemia: Secondary | ICD-10-CM | POA: Diagnosis not present

## 2021-08-09 DIAGNOSIS — R630 Anorexia: Secondary | ICD-10-CM | POA: Insufficient documentation

## 2021-08-09 DIAGNOSIS — C7931 Secondary malignant neoplasm of brain: Secondary | ICD-10-CM | POA: Insufficient documentation

## 2021-08-09 DIAGNOSIS — Z801 Family history of malignant neoplasm of trachea, bronchus and lung: Secondary | ICD-10-CM | POA: Insufficient documentation

## 2021-08-09 LAB — URINALYSIS, DIPSTICK ONLY
Bilirubin Urine: NEGATIVE
Glucose, UA: NEGATIVE mg/dL
Ketones, ur: NEGATIVE mg/dL
Nitrite: NEGATIVE
Protein, ur: NEGATIVE mg/dL
Specific Gravity, Urine: 1.003 — ABNORMAL LOW (ref 1.005–1.030)
pH: 6 (ref 5.0–8.0)

## 2021-08-09 LAB — CBC WITH DIFFERENTIAL/PLATELET
Abs Immature Granulocytes: 0.16 10*3/uL — ABNORMAL HIGH (ref 0.00–0.07)
Basophils Absolute: 0 10*3/uL (ref 0.0–0.1)
Basophils Relative: 0 %
Eosinophils Absolute: 0 10*3/uL (ref 0.0–0.5)
Eosinophils Relative: 0 %
HCT: 38.7 % (ref 36.0–46.0)
Hemoglobin: 12.8 g/dL (ref 12.0–15.0)
Immature Granulocytes: 2 %
Lymphocytes Relative: 8 %
Lymphs Abs: 0.6 10*3/uL — ABNORMAL LOW (ref 0.7–4.0)
MCH: 29 pg (ref 26.0–34.0)
MCHC: 33.1 g/dL (ref 30.0–36.0)
MCV: 87.8 fL (ref 80.0–100.0)
Monocytes Absolute: 0.5 10*3/uL (ref 0.1–1.0)
Monocytes Relative: 7 %
Neutro Abs: 5.8 10*3/uL (ref 1.7–7.7)
Neutrophils Relative %: 83 %
Platelets: 112 10*3/uL — ABNORMAL LOW (ref 150–400)
RBC: 4.41 MIL/uL (ref 3.87–5.11)
RDW: 17.9 % — ABNORMAL HIGH (ref 11.5–15.5)
Smear Review: NORMAL
WBC: 7.1 10*3/uL (ref 4.0–10.5)
nRBC: 0 % (ref 0.0–0.2)

## 2021-08-09 LAB — COMPREHENSIVE METABOLIC PANEL
ALT: 113 U/L — ABNORMAL HIGH (ref 0–44)
AST: 33 U/L (ref 15–41)
Albumin: 3.8 g/dL (ref 3.5–5.0)
Alkaline Phosphatase: 149 U/L — ABNORMAL HIGH (ref 38–126)
Anion gap: 13 (ref 5–15)
BUN: 18 mg/dL (ref 8–23)
CO2: 26 mmol/L (ref 22–32)
Calcium: 9.3 mg/dL (ref 8.9–10.3)
Chloride: 96 mmol/L — ABNORMAL LOW (ref 98–111)
Creatinine, Ser: 0.66 mg/dL (ref 0.44–1.00)
GFR, Estimated: >60 mL/min (ref >60)
Glucose, Bld: 195 mg/dL — ABNORMAL HIGH (ref 70–99)
Potassium: 4 mmol/L (ref 3.5–5.1)
Sodium: 135 mmol/L (ref 135–145)
Total Bilirubin: 0.6 mg/dL (ref 0.3–1.2)
Total Protein: 6.8 g/dL (ref 6.5–8.1)

## 2021-08-09 NOTE — Progress Notes (Signed)
Radiation Oncology Follow up Note  Name: Sandra Jordan   Date:   08/09/2021 MRN:  [id number] DOB: 10/16/1950    This 70 y.o. 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  REFERRING PROVIDER: Marinda Elk, MD  HPI: Patient is a 70 year old female now at 1 month having completed whole brain radiation therapy for innumerable brain metastasis.  Seen today in routine follow-up she is doing fair.  Still having some problems with blurry vision.  Mild headaches.  She is currently on carboplatinum Taxol and Avastin under medical oncology's direction.  She is quite frail and anorexic today..  COMPLICATIONS OF TREATMENT: none  FOLLOW UP COMPLIANCE: keeps appointments   PHYSICAL EXAM:  BP (!) 136/95   Pulse (!) 109   Temp 98.4 F (36.9 C) (Tympanic)   Resp 16   Wt 120 lb (54.4 kg)   BMI 20.92 kg/m  Frail-appearing female in NAD.  She has hyperpigmentation of the scalp consistent with prior radiation.  Well-developed well-nourished patient in NAD. HEENT reveals PERLA, EOMI, discs not visualized.  Oral cavity is clear. No oral mucosal lesions are identified. Neck is clear without evidence of cervical or supraclavicular adenopathy. Lungs are clear to A&P. Cardiac examination is essentially unremarkable with regular rate and rhythm without murmur rub or thrill. Abdomen is benign with no organomegaly or masses noted. Motor sensory and DTR levels are equal and symmetric in the upper and lower extremities. Cranial nerves II through XII are grossly intact. Proprioception is intact. No peripheral adenopathy or edema is identified. No motor or sensory levels are noted. Crude visual fields are within normal range.  RADIOLOGY RESULTS: No current films for review  PLAN: Present time patient continues systemic treatment with medical oncology.  I will turn follow-up care over to  medical oncology.  I be happy to reevaluate her anytime should any further palliative treatment be indicated.  I have asked him to alert the staff about her vision.  I would like to take this opportunity to thank you for allowing me to participate in the care of your patient.Noreene Filbert, MD

## 2021-08-10 ENCOUNTER — Other Ambulatory Visit: Payer: Medicare Other

## 2021-08-10 DIAGNOSIS — Z515 Encounter for palliative care: Secondary | ICD-10-CM

## 2021-08-10 NOTE — Progress Notes (Signed)
PATIENT NAME: Sandra Jordan DOB: 05-Aug-1951 MRN: [id number]  PRIMARY CARE PROVIDER: Marinda Elk, MD  RESPONSIBLE PARTY:  Acct ID - Guarantor Home Phone Work Phone Relationship Acct Type  [id number] [name] Self P/F     [address], [place], [place] [address]    Due to the COVID-19 crisis, this visit was done via telemedicine from my office and it was initiated and consent by this patient and or family.  I connected with  [name] on 08/10/21 telephonically and verified that I am speaking with the correct person using two identifiers.   I discussed the limitations of evaluation and management by telemedicine. The patient expressed understanding and agreed to proceed.   PLAN OF CARE and INTERVENTIONS:               1.  GOALS OF CARE/ ADVANCE CARE PLANNING:  Patient desires to remain in her home with the assistance of her family.               2.  PATIENT/CAREGIVER EDUCATION:  Safety               4. PERSONAL EMERGENCY PLAN:  Activate 911 for emergencies.               5.  DISEASE STATUS:  Connected with patient by telephone at the request of [name], SW.  Patient answered the phone and states she is doing well.  When questioning her about recent falls, patient confirms 2 recent falls without injury.  She is reporting increase weakness to her right knee and needing to use her Winkels more often.  We discussed the use of a wheelchair in the home but patient declines this presently.   She feels her home is too small and it would be to challenging to navigate a wheelchair in the home.  Po intake has been poor over the last few days.  Noted weight loss based on RD note last week.  Patient states sometimes she will eat decent but she is having issues with nausea.  Questioned if she is utilizing zofran as needed.  Patient endorses she is utilizing this medication and it is beneficial.  Encouraged use of supplements due to poor  intake.  Discussed medication management.  Niece-Keisha continues to fill a pill box for patient.  She endorses taking medications as ordered.  Patient continues to endorse her brother helps remind her to take her medications.    Advised patient that Palliative Care team will be present next Monday at 1230 for a home visit.  Patient confirms date and time.   HISTORY OF PRESENT ILLNESS:  70 year old female with Primary Adenocarcinoma of Upper Lobe of Right Lung.   Patient is being followed by Palliative Care monthly and PRN.   CODE STATUS: Full ADVANCED DIRECTIVES: No MOST FORM: No PPS: 50%        Lorenza Burton, RN

## 2021-08-14 ENCOUNTER — Other Ambulatory Visit: Payer: Medicare Other

## 2021-08-14 ENCOUNTER — Other Ambulatory Visit: Payer: Self-pay

## 2021-08-14 VITALS — BP 148/98 | HR 67 | Temp 97.9°F | Resp 20

## 2021-08-14 DIAGNOSIS — Z515 Encounter for palliative care: Secondary | ICD-10-CM

## 2021-08-14 NOTE — Progress Notes (Signed)
PATIENT NAME: Sandra Jordan DOB: [birth year] MRN: [id number]  PRIMARY CARE PROVIDER: Marinda Elk, MD  RESPONSIBLE PARTY:  Acct ID - Guarantor Home Phone Work Phone Relationship Acct Type  [id number] Sandra [phone] Self P/F     [address], [place], [place] [address]    PLAN OF CARE and INTERVENTIONS:               1.  GOALS OF CARE/ ADVANCE CARE PLANNING:  Discussion completed on ACP and code status.  Patient would like to think more about options and discuss with her niece/nephew.                2.  PATIENT/CAREGIVER EDUCATION:  ACP/Code status and Appetite stimulant.                4. PERSONAL EMERGENCY PLAN:  Activate 911 for emergencies.               5.  DISEASE STATUS:  Joint visit completed with Sandra Jordan, SW, patient and niece Sandra Jordan.     ACP:  Discussed code status and ACP with patient and niece.  Paperwork has been left with niece.  Anorexia:  Patient continues to endorse poor appetite.  This is partially due to her fear of nausea.  She has Zofran present in the home and takes this on a regular basis and reports this is effective.  Patient received a case of Ensure from the dietician and endorses drinking this 2 x daily until she completed the case.  Patient is requesting more from her family.  We discussed the use of Remeron and patient is currently on.  Niece endorses this pill is often missed in the pill box.   Generalized Weakness:  Patient has sustained 2 falls since my last visit.  She reports tripping over rolls in the carpet.  She is working to have this removed.  Patient is now using a rolling Schlitt at all times.  Memory Loss:  Niece endorses some memory loss and confusion that is worsening.   At times, pills have been missed in her prefilled pill box.   Patient has reported pill box needed to be filled when medications are already present.  Niece is assisting more with financial piece.   Patient is forgetting to write out her bills  out or has forgotten to ask her niece or nephew for assistance.   Patient reports her brother left prior to our visit to pay car insurance but later in the visit she believes her brother went to the grocery store to obtain more boost supplement.   Oral Thrush:  Assessed oral mucosa which is much improved.  Patient has completed her nystatin mouth wash and is now on diflucan bid to address thrush.   Fatigue:  Sleeping more during the daytime. 3-4 naps that last for 20-30 minutes. Mostly remains in the living room during the daytime.  Denies issues with insomnia.   Only ambulating to the mailbox 1 x weekly.    Medication Management: Discussed use of automated pill box that can be filled by niece.  Alarms are on the pill box and she would not be able to access medications until it is time.  Niece will assist patient in exploring options.    HISTORY OF PRESENT ILLNESS:  Sandra Jordan is a 70 y.o. year old female  with multiple medical problems including Stage IVa adenocarcinoma of the right upper lobe lung with innumerable brain metastasis, HTN, HLD, h/o  colon polyp, allergy.  Patient is being followed by Palliative Care every 4-8 weeks and PRN.   CODE STATUS: Full ADVANCED DIRECTIVES: No MOST FORM: No PPS: 50%   PHYSICAL EXAM:   VITALS: Today's Vitals   08/14/21 1240  BP: (!) 148/98  Pulse: 67  Resp: 20  Temp: 97.9 F (36.6 C)  SpO2: 98%  PainSc: 0-No pain    LUNGS: clear to auscultation  CARDIAC: Cor RRR}  EXTREMITIES: Trace edema present to bilateral ankles.  SKIN: Skin color, texture, turgor normal. No rashes or lesions or normal  NEURO: positive for gait problems and memory problems  Time: 1230 pm-150 pm       Lorenza Burton, RN

## 2021-08-14 NOTE — Progress Notes (Signed)
COMMUNITY PALLIATIVE CARE SW NOTE  PATIENT NAME: Sandra Jordan DOB: [birth year] MRN: [id number]  PRIMARY CARE PROVIDER: Marinda Elk, MD  RESPONSIBLE PARTY:  Acct ID - Guarantor Home Phone Work Phone Relationship Acct Type  [id number] [name] Self P/F     [address], [place], [place] [address]     PLAN OF CARE and INTERVENTIONS:             GOALS OF CARE/ ADVANCE CARE PLANNING:  Patient is a full code. ACP discussed and continue to be discussed with patient and family. Patient's goals include to maximize quality of life and symptom management and avoid hospitalizations.   2.         SOCIAL/EMOTIONAL/SPIRITUAL ASSESSMENT/ INTERVENTIONS:  SW and RN met with patient in patients home for monthly follow up visit. Patients niece, [name], present during visit.   Patient updated PC team on medical changes. Patient share that she is feeling good today. Patient share that she had visitors from her church yesterday. Patient has had 2 falls since last PC home visit, patient share that the falls occurred due to her carpet and throw rugs. Patient is using her RW more for safety. Niece share that patient's memory is declining and patient is forgetting to take her medications or that her pill box has been adequately filled. Patient admits to SW that her memory is declining and she does not remember things as she used to.   ACP discussed in detail with patient and niece [name]. Patient acknowledges that she should have a POA in place and that she will appointment her neice and nephew as her POA. SW left copies of advance directives and 5 wishes with patient and niece to review, complete and have notarized.    RN reviewed medications and took vitals. Patients thrush has appeared to have resolved. Patients niece, [name], continues to assist in managing patients medications and fills pill box weekly. Automatic pill box dispenser with reminders discussed with patient,  patient is aware of cost. Pros of considering purchasing discussed pill box discussed.  Psychosocial assessment completed. Patient shared that she was under the impression that PC was ordered to come to the home and assist with needs and medications weekly. SW provided information about PC services and the role of SW to assist with connecting to community resources to provide patient needs. Medicaid eligibility was discussed, patient does not meet income guidelines for Medicaid as she guesstimates her monthly income being over $1200. SW discussed private in home care aids as well as PACE program. SW emailed PACE brochure to niece and made PACE referral per patient and niece request. Patient receives food and resource benefit card from [facility] with $180 supplied monthly for patient to utilize to buy food and OTC medications. Patients brother is now driving, and concern of safety has been expressed. Ongoing support will continued to be offered.    3.         PATIENT/CAREGIVER EDUCATION/ COPING:  Patient A&O x3, able to answer questions appropriately. Patient in good spirits today. Patient denies any anxiety or depression at this time. PHQ 9 completed and indicated 2; which suggest minimal depression and no treatment is required at this time. Patients family and friends are supportive and visit weekly/daily.   4.         PERSONAL EMERGENCY PLAN:  Patient will call 9-1-1 for emergencies.    5.         COMMUNITY RESOURCES COORDINATION/ HEALTH CARE NAVIGATION:  Patient and daughter manages her care.   6.         FINANCIAL/LEGAL CONCERNS/INTERVENTIONS:  None.        SOCIAL HX:  Social History   Tobacco Use   Smoking status: Former    Years: 15.00    Types: Cigarettes    Quit date: 10/08/2004    Years since quitting: 16.8   Smokeless tobacco: Never  Substance Use Topics   Alcohol use: No    Alcohol/week: 0.0 standard drinks    CODE STATUS: Full code ADVANCED DIRECTIVES: Discussed MOST FORM COMPLETE:   N HOSPICE EDUCATION PROVIDED: N  PPS: Patient is independent with all ADL's at this time. Patient is A&O x3 with slightly average insight and judgement.    Time spent: 1 hr   [name], [place]

## 2021-08-17 NOTE — Progress Notes (Signed)
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  INTERVAL HISTORY: Patient returns to clinic today for further evaluation and consideration of cycle 3 of carboplatinum, Taxol, and Avastin.  She continues to have chronic weakness and fatigue, but otherwise feels well.  She denies any abdominal pain. She has no neurologic complaints.  She denies any recent fevers or illnesses.  Her appetite has improved.  She has no chest pain, shortness of breath, cough, or hemoptysis.  She denies any nausea, vomiting, constipation, or diarrhea.  She has no urinary complaints.  Patient offers no further specific complaints today.  REVIEW OF SYSTEMS:   Review of Systems  Constitutional:  Positive for malaise/fatigue. Negative for fever and weight loss.  Respiratory: Negative.  Negative for cough, hemoptysis and shortness of breath.   Cardiovascular: Negative.  Negative for chest pain and leg swelling.  Gastrointestinal: Negative.  Negative for abdominal pain, constipation and nausea.  Genitourinary: Negative.  Negative for dysuria.  Musculoskeletal: Negative.  Negative for back pain.  Skin: Negative.  Negative for rash.  Neurological:  Positive for weakness. Negative for dizziness, focal weakness and headaches.  Psychiatric/Behavioral: Negative.  The patient is not nervous/anxious.    As per HPI. Otherwise, a complete review of systems is negative.  PAST MEDICAL HISTORY: Past Medical History:  Diagnosis Date   Allergy    Colon  polyp    Hyperlipidemia    Hypertension    Lung cancer ([place])     PAST SURGICAL HISTORY: Past Surgical History:  Procedure Laterality Date   ABDOMINAL HYSTERECTOMY  1984   BREAST BIOPSY Bilateral 1990   fibrocystic breast   BREAST BIOPSY Right August 2014   fibrocystic breast   CHOLECYSTECTOMY  2008   COLONOSCOPY  2012, 2017   [facility]   IR CV LINE INJECTION  07/07/2021   IR IMAGING GUIDED PORT INSERTION  06/20/2021   IR IMAGING GUIDED PORT INSERTION  07/24/2021    FAMILY HISTORY: Family History  Problem Relation Age of Onset   Lung cancer Mother    Colon cancer Father 61   Prostate cancer Father    Lung cancer Sister    Lung cancer Maternal Uncle    Stomach cancer Maternal Grandfather    Breast cancer Niece     ADVANCED DIRECTIVES (Y/N):  N  HEALTH MAINTENANCE: Social History   Tobacco Use   Smoking status: Former    Years: 15.00    Types: Cigarettes    Quit date: 10/08/2004    Years since quitting: 16.8   Smokeless tobacco: Never  Substance Use Topics   Alcohol use: No    Alcohol/week: 0.0 standard drinks   Drug use: No     Colonoscopy:  PAP:  Bone density:  Lipid panel:  No Known Allergies  Current Outpatient Medications  Medication Sig Dispense Refill   dexamethasone (DECADRON) 4 MG tablet Take 1 tablet (4 mg total) by mouth daily. 30 tablet 3   fluconazole (DIFLUCAN) 100 MG tablet Take 1 tablet (100 mg total) by mouth daily. 5 tablet 0   hydrALAZINE (APRESOLINE) 25 MG tablet Take 25  mg by mouth 2 (two) times daily.     hydrochlorothiazide (HYDRODIURIL) 25 MG tablet Take 25 mg by mouth daily. Prn if bp above 150/90     lidocaine-prilocaine (EMLA) cream Apply 1 application topically as needed. 30 g 0   lovastatin (MEVACOR) 20 MG tablet Take 20 mg by mouth at bedtime.     lovastatin (MEVACOR) 40 MG tablet SMARTSIG:1 Tablet(s) By Mouth Every Evening     potassium chloride (KLOR-CON) 10 MEQ tablet Take 2 tablets (20 mEq total) by mouth daily. 60 tablet 0    potassium chloride 20 MEQ/15ML (10%) SOLN Take 15 mLs (20 mEq total) by mouth 2 (two) times daily. 473 mL 0   cetirizine (ZYRTEC) 10 MG tablet Take 10 mg by mouth daily. (Patient not taking: No sig reported)     cyanocobalamin 1000 MCG tablet Take by mouth. (Patient not taking: No sig reported)     fluticasone (FLONASE) 50 MCG/ACT nasal spray Place into both nostrils. (Patient not taking: No sig reported)     ibuprofen (ADVIL) 800 MG tablet Take 800 mg by mouth every 6 (six) hours as needed. (Patient not taking: No sig reported)     meloxicam (MOBIC) 7.5 MG tablet Take 7.5 mg by mouth daily. (Patient not taking: No sig reported)     mirtazapine (REMERON) 15 MG tablet Take 2 tablets (30 mg total) by mouth at bedtime. 60 tablet 1   ondansetron (ZOFRAN-ODT) 8 MG disintegrating tablet Take 1 tablet (8 mg total) by mouth every 8 (eight) hours as needed for nausea or vomiting. (Patient not taking: No sig reported) 60 tablet 0   prochlorperazine (COMPAZINE) 10 MG tablet Take 1 tablet (10 mg total) by mouth every 6 (six) hours as needed for nausea or vomiting. (Patient not taking: No sig reported) 60 tablet 0   No current facility-administered medications for this visit.   Facility-Administered Medications Ordered in Other Visits  Medication Dose Route Frequency Provider Last Rate Last Admin   heparin lock flush 100 unit/mL  500 Units Intravenous Once Borders, Kirt Boys, NP        OBJECTIVE: Vitals:   08/23/21 0837  BP: (!) 150/94  Pulse: 94  Resp: 16  Temp: (!) 97.2 F (36.2 C)  SpO2: 99%     Body mass index is 21.74 kg/m.    ECOG FS:0 - Asymptomatic  General: Thin, no acute distress.  Sitting in a wheelchair. Eyes: Pink conjunctiva, anicteric sclera. HEENT: Normocephalic, moist mucous membranes. Lungs: No audible wheezing or coughing. Heart: Regular rate and rhythm. Abdomen: Soft, nontender, no obvious distention. Musculoskeletal: No edema, cyanosis, or clubbing. Neuro: Alert,  answering all questions appropriately. Cranial nerves grossly intact. Skin: No rashes or petechiae noted. Psych: Normal affect.   LAB RESULTS:  Lab Results  Component Value Date   NA 133 (L) 08/23/2021   K 3.4 (L) 08/23/2021   CL 91 (L) 08/23/2021   CO2 30 08/23/2021   GLUCOSE 128 (H) 08/23/2021   BUN 25 (H) 08/23/2021   CREATININE 0.63 08/23/2021   CALCIUM 9.2 08/23/2021   PROT 6.9 08/23/2021   ALBUMIN 3.5 08/23/2021   AST 80 (H) 08/23/2021   ALT 179 (H) 08/23/2021   ALKPHOS 239 (H) 08/23/2021   BILITOT 0.3 08/23/2021   GFRNONAA >60 08/23/2021   GFRAA >60 01/26/2013    Lab Results  Component Value Date   WBC 33.2 (H) 08/23/2021   NEUTROABS 26.5 (H) 08/23/2021   HGB 10.9 (L) 08/23/2021   HCT 33.2 (L)  08/23/2021   MCV 90.5 08/23/2021   PLT 265 08/23/2021     STUDIES: US Abdomen Limited RUQ (LIVER/GB)  Result Date: 07/27/2021 CLINICAL DATA:  Elevated liver enzymes EXAM: ULTRASOUND ABDOMEN LIMITED RIGHT UPPER QUADRANT COMPARISON:  CT 06/12/2021 FINDINGS: Gallbladder: Status post cholecystectomy Common bile duct: Diameter: 8.2 mm Liver: Diffusely echogenic. No focal hepatic abnormality. Portal vein is patent on color Doppler imaging with normal direction of blood flow towards the liver. Other: None. IMPRESSION: 1. Status post cholecystectomy. 2. Echogenic liver consistent with hepatic steatosis and or hepatocellular disease. Electronically Signed   By: Donavan Foil M.D.   On: 07/27/2021 23:47    ASSESSMENT: Stage IVa adenocarcinoma of the right upper lobe lung with innumerable brain metastasis.  PLAN:    Stage IVa adenocarcinoma of the right upper lobe lung with innumerable brain metastasis: Patient has now completed whole brain XRT.  Patient now has a port. PET scan results from June 21, 2021 reviewed independently with multiple lung metastasis, but no evidence of systemic disease other than patient's known lesions in her brain.  Omnisec testing did not reveal any  significant actionable mutations.  Patient has a persistent transaminitis, but treatment does not need to be dose reduced and liver dysfunction.  Proceed with cycle 3 of carboplatinum AUC 5, Taxol 175 mg/m2, and Avastin 15 mg/kg with Ziextenzo support.  Return to clinic in 2 days for [facility] and then in 3 weeks for further evaluation and consideration of cycle 4.  Plan to reimage at the conclusion of cycle 4.   Brain metastasis: Patient has now completed XRT. Nausea, poor appetite: Significantly improved.  Continue Decadron and Zofran as needed. Hypokalemia: Mild.  Continue oral potassium supplementation. Leukocytosis: White blood cell significantly elevated today at 33.2.  Possibly related to Ziextenzo, monitor. Elevated liver enzymes: Chronic and unchanged.  Previously, ultrasound was negative for any anatomic abnormalities.  Liver function seems to be adequate with normal bilirubin, PT, and PTT.  LDH is only mildly elevated.  Proceed with treatment as above.  Monitor.   Constipation: Resolved.  Continue regular bowel regimen as instructed.  Patient expressed understanding and was in agreement with this plan. She also understands that She can call clinic at any time with any questions, concerns, or complaints.    Cancer Staging  Primary adenocarcinoma of upper lobe of right lung [employer] [facility]) Staging form: Lung, AJCC 8th Edition - Clinical stage from 06/27/2021: Stage IVA (cT3, cN3, pM1b) - Signed by Lloyd Huger, MD on 06/27/2021 Stage prefix: Initial diagnosis  Lloyd Huger, MD   08/24/2021 9:41 AM

## 2021-08-20 ENCOUNTER — Encounter: Payer: Self-pay | Admitting: Oncology

## 2021-08-20 DIAGNOSIS — C3411 Malignant neoplasm of upper lobe, right bronchus or lung: Secondary | ICD-10-CM

## 2021-08-21 ENCOUNTER — Encounter: Payer: Self-pay | Admitting: Oncology

## 2021-08-21 MED ORDER — DEXAMETHASONE 4 MG PO TABS: 4.0000 mg | ORAL_TABLET | Freq: Every day | ORAL | 3 refills | Status: DC

## 2021-08-23 ENCOUNTER — Inpatient Hospital Stay: Payer: Medicare Other

## 2021-08-23 ENCOUNTER — Inpatient Hospital Stay (HOSPITAL_BASED_OUTPATIENT_CLINIC_OR_DEPARTMENT_OTHER): Payer: Medicare Other | Admitting: Oncology

## 2021-08-23 ENCOUNTER — Other Ambulatory Visit: Payer: Self-pay

## 2021-08-23 VITALS — BP 150/94 | HR 94 | Temp 97.2°F | Resp 16 | Wt 124.7 lb

## 2021-08-23 DIAGNOSIS — C3411 Malignant neoplasm of upper lobe, right bronchus or lung: Secondary | ICD-10-CM | POA: Diagnosis not present

## 2021-08-23 DIAGNOSIS — Z5112 Encounter for antineoplastic immunotherapy: Secondary | ICD-10-CM | POA: Diagnosis not present

## 2021-08-23 LAB — CBC WITH DIFFERENTIAL/PLATELET
Abs Immature Granulocytes: 3.86 10*3/uL — ABNORMAL HIGH (ref 0.00–0.07)
Basophils Absolute: 0 10*3/uL (ref 0.0–0.1)
Basophils Relative: 0 %
Eosinophils Absolute: 0 10*3/uL (ref 0.0–0.5)
Eosinophils Relative: 0 %
HCT: 33.2 % — ABNORMAL LOW (ref 36.0–46.0)
Hemoglobin: 10.9 g/dL — ABNORMAL LOW (ref 12.0–15.0)
Immature Granulocytes: 12 %
Lymphocytes Relative: 5 %
Lymphs Abs: 1.6 10*3/uL (ref 0.7–4.0)
MCH: 29.7 pg (ref 26.0–34.0)
MCHC: 32.8 g/dL (ref 30.0–36.0)
MCV: 90.5 fL (ref 80.0–100.0)
Monocytes Absolute: 1.1 10*3/uL — ABNORMAL HIGH (ref 0.1–1.0)
Monocytes Relative: 3 %
Neutro Abs: 26.5 10*3/uL — ABNORMAL HIGH (ref 1.7–7.7)
Neutrophils Relative %: 80 %
Platelets: 265 10*3/uL (ref 150–400)
RBC: 3.67 MIL/uL — ABNORMAL LOW (ref 3.87–5.11)
RDW: 19.6 % — ABNORMAL HIGH (ref 11.5–15.5)
Smear Review: NORMAL
WBC: 33.2 10*3/uL — ABNORMAL HIGH (ref 4.0–10.5)
nRBC: 0.6 % — ABNORMAL HIGH (ref 0.0–0.2)

## 2021-08-23 LAB — COMPREHENSIVE METABOLIC PANEL
ALT: 179 U/L — ABNORMAL HIGH (ref 0–44)
AST: 80 U/L — ABNORMAL HIGH (ref 15–41)
Albumin: 3.5 g/dL (ref 3.5–5.0)
Alkaline Phosphatase: 239 U/L — ABNORMAL HIGH (ref 38–126)
Anion gap: 12 (ref 5–15)
BUN: 25 mg/dL — ABNORMAL HIGH (ref 8–23)
CO2: 30 mmol/L (ref 22–32)
Calcium: 9.2 mg/dL (ref 8.9–10.3)
Chloride: 91 mmol/L — ABNORMAL LOW (ref 98–111)
Creatinine, Ser: 0.63 mg/dL (ref 0.44–1.00)
GFR, Estimated: >60 mL/min (ref >60)
Glucose, Bld: 128 mg/dL — ABNORMAL HIGH (ref 70–99)
Potassium: 3.4 mmol/L — ABNORMAL LOW (ref 3.5–5.1)
Sodium: 133 mmol/L — ABNORMAL LOW (ref 135–145)
Total Bilirubin: 0.3 mg/dL (ref 0.3–1.2)
Total Protein: 6.9 g/dL (ref 6.5–8.1)

## 2021-08-23 LAB — URINALYSIS, DIPSTICK ONLY
Bilirubin Urine: NEGATIVE
Glucose, UA: NEGATIVE mg/dL
Hgb urine dipstick: NEGATIVE
Ketones, ur: NEGATIVE mg/dL
Leukocytes,Ua: NEGATIVE
Nitrite: NEGATIVE
Protein, ur: NEGATIVE mg/dL
Specific Gravity, Urine: 1.002 — ABNORMAL LOW (ref 1.005–1.030)
pH: 7 (ref 5.0–8.0)

## 2021-08-23 MED ORDER — PALONOSETRON HCL INJECTION 0.25 MG/5ML
0.2500 mg | Freq: Once | INTRAVENOUS | Status: AC
  Administered 2021-08-23: 0.25 mg via INTRAVENOUS
  Filled 2021-08-23: qty 5

## 2021-08-23 MED ORDER — SODIUM CHLORIDE 0.9 % IV SOLN
10.0000 mg | Freq: Once | INTRAVENOUS | Status: AC
  Administered 2021-08-23: 10 mg via INTRAVENOUS
  Filled 2021-08-23: qty 10

## 2021-08-23 MED ORDER — SODIUM CHLORIDE 0.9 % IV SOLN
Freq: Once | INTRAVENOUS | Status: AC
  Filled 2021-08-23: qty 250

## 2021-08-23 MED ORDER — SODIUM CHLORIDE 0.9 % IV SOLN
150.0000 mg | Freq: Once | INTRAVENOUS | Status: AC
  Administered 2021-08-23: 150 mg via INTRAVENOUS
  Filled 2021-08-23: qty 150

## 2021-08-23 MED ORDER — FAMOTIDINE 20 MG IN NS 100 ML IVPB
20.0000 mg | Freq: Once | INTRAVENOUS | Status: AC
  Administered 2021-08-23: 20 mg via INTRAVENOUS
  Filled 2021-08-23: qty 20

## 2021-08-23 MED ORDER — MIRTAZAPINE 15 MG PO TABS: 30.0000 mg | ORAL_TABLET | Freq: Every day | ORAL | 1 refills | Status: DC

## 2021-08-23 MED ORDER — DIPHENHYDRAMINE HCL 50 MG/ML IJ SOLN
25.0000 mg | Freq: Once | INTRAMUSCULAR | Status: AC
  Administered 2021-08-23: 25 mg via INTRAVENOUS
  Filled 2021-08-23: qty 1

## 2021-08-23 MED ORDER — SODIUM CHLORIDE 0.9 % IV SOLN
360.0000 mg | Freq: Once | INTRAVENOUS | Status: AC
  Administered 2021-08-23: 360 mg via INTRAVENOUS
  Filled 2021-08-23: qty 36

## 2021-08-23 MED ORDER — SODIUM CHLORIDE 0.9 % IV SOLN
15.0000 mg/kg | Freq: Once | INTRAVENOUS | Status: AC
  Administered 2021-08-23: 900 mg via INTRAVENOUS
  Filled 2021-08-23: qty 32

## 2021-08-23 MED ORDER — HEPARIN SOD (PORK) LOCK FLUSH 100 UNIT/ML IV SOLN
500.0000 [IU] | Freq: Once | INTRAVENOUS | Status: AC | PRN
  Administered 2021-08-23: 500 [IU]
  Filled 2021-08-23: qty 5

## 2021-08-23 MED ORDER — SODIUM CHLORIDE 0.9 % IV SOLN
175.0000 mg/m2 | Freq: Once | INTRAVENOUS | Status: AC
  Administered 2021-08-23: 282 mg via INTRAVENOUS
  Filled 2021-08-23: qty 47

## 2021-08-23 NOTE — Patient Instructions (Signed)
[facility]  Discharge Instructions: Thank you for choosing [facility] to provide your oncology and hematology care.  If you have a lab appointment with the [facility], please go directly to the [facility] and check in at the registration area.  Wear comfortable clothing and clothing appropriate for easy access to any Portacath or PICC line.   We strive to give you quality time with your provider. You may need to reschedule your appointment if you arrive late (15 or more minutes).  Arriving late affects you and other patients whose appointments are after yours.  Also, if you miss three or more appointments without notifying the office, you may be dismissed from the clinic at the provider's discretion.      For prescription refill requests, have your pharmacy contact our office and allow 72 hours for refills to be completed.    Today you received the following chemotherapy and/or immunotherapy agents: Zirabev, Taxol, Carboplatin     To help prevent nausea and vomiting after your treatment, we encourage you to take your nausea medication as directed.  BELOW ARE SYMPTOMS THAT SHOULD BE REPORTED IMMEDIATELY: *FEVER GREATER THAN 100.4 F (38 C) OR HIGHER *CHILLS OR SWEATING *NAUSEA AND VOMITING THAT IS NOT CONTROLLED WITH YOUR NAUSEA MEDICATION *UNUSUAL SHORTNESS OF BREATH *UNUSUAL BRUISING OR BLEEDING *URINARY PROBLEMS (pain or burning when urinating, or frequent urination) *BOWEL PROBLEMS (unusual diarrhea, constipation, pain near the anus) TENDERNESS IN MOUTH AND THROAT WITH OR WITHOUT PRESENCE OF ULCERS (sore throat, sores in mouth, or a toothache) UNUSUAL RASH, SWELLING OR PAIN  UNUSUAL VAGINAL DISCHARGE OR ITCHING   Items with * indicate a potential emergency and should be followed up as soon as possible or go to the Emergency Department if any problems should occur.  Please show the CHEMOTHERAPY ALERT CARD or IMMUNOTHERAPY ALERT  CARD at check-in to the Emergency Department and triage nurse.  Should you have questions after your visit or need to cancel or reschedule your appointment, please contact [facility]  [phone] and follow the prompts.  Office hours are 8:00 a.m. to 4:30 p.m. Monday - Friday. Please note that voicemails left after 4:00 p.m. may not be returned until the following business day.  We are closed weekends and major holidays. You have access to a nurse at all times for urgent questions. Please call the main number to the clinic [phone] and follow the prompts.  For any non-urgent questions, you may also contact your provider using MyChart. We now offer e-Visits for anyone 69 and older to request care online for non-urgent symptoms. For details visit [email or web address].   Also download the MyChart app! Go to the app store, search "MyChart", open the app, select [facility], and log in with your MyChart username and password.  Due to Covid, a mask is required upon entering the hospital/clinic. If you do not have a mask, one will be given to you upon arrival. For doctor visits, patients may have 1 support person aged 71 or older with them. For treatment visits, patients cannot have anyone with them due to current Covid guidelines and our immunocompromised population. Carboplatin injection What is this medication? CARBOPLATIN (KAR boe pla tin) is a chemotherapy drug. It targets fast dividing cells, like cancer cells, and causes these cells to die. This medicine is used to treat ovarian cancer and many other cancers. This medicine may be used for other purposes; ask your health care provider or pharmacist  if you have questions. COMMON BRAND NAME(S): Paraplatin What should I tell my care team before I take this medication? They need to know if you have any of these conditions: blood disorders hearing problems kidney disease recent or ongoing radiation therapy an  unusual or allergic reaction to carboplatin, cisplatin, other chemotherapy, other medicines, foods, dyes, or preservatives pregnant or trying to get pregnant breast-feeding How should I use this medication? This drug is usually given as an infusion into a vein. It is administered in a hospital or clinic by a specially trained health care professional. Talk to your pediatrician regarding the use of this medicine in children. Special care may be needed. Overdosage: If you think you have taken too much of this medicine contact a poison control center or emergency room at once. NOTE: This medicine is only for you. Do not share this medicine with others. What if I miss a dose? It is important not to miss a dose. Call your doctor or health care professional if you are unable to keep an appointment. What may interact with this medication? medicines for seizures medicines to increase blood counts like filgrastim, pegfilgrastim, sargramostim some antibiotics like amikacin, gentamicin, neomycin, streptomycin, tobramycin vaccines Talk to your doctor or health care professional before taking any of these medicines: acetaminophen aspirin ibuprofen ketoprofen naproxen This list may not describe all possible interactions. Give your health care provider a list of all the medicines, herbs, non-prescription drugs, or dietary supplements you use. Also tell them if you smoke, drink alcohol, or use illegal drugs. Some items may interact with your medicine. What should I watch for while using this medication? Your condition will be monitored carefully while you are receiving this medicine. You will need important blood work done while you are taking this medicine. This drug may make you feel generally unwell. This is not uncommon, as chemotherapy can affect healthy cells as well as cancer cells. Report any side effects. Continue your course of treatment even though you feel ill unless your doctor tells you to  stop. In some cases, you may be given additional medicines to help with side effects. Follow all directions for their use. Call your doctor or health care professional for advice if you get a fever, chills or sore throat, or other symptoms of a cold or flu. Do not treat yourself. This drug decreases your body's ability to fight infections. Try to avoid being around people who are sick. This medicine may increase your risk to bruise or bleed. Call your doctor or health care professional if you notice any unusual bleeding. Be careful brushing and flossing your teeth or using a toothpick because you may get an infection or bleed more easily. If you have any dental work done, tell your dentist you are receiving this medicine. Avoid taking products that contain aspirin, acetaminophen, ibuprofen, naproxen, or ketoprofen unless instructed by your doctor. These medicines may hide a fever. Do not become pregnant while taking this medicine. Women should inform their doctor if they wish to become pregnant or think they might be pregnant. There is a potential for serious side effects to an unborn child. Talk to your health care professional or pharmacist for more information. Do not breast-feed an infant while taking this medicine. What side effects may I notice from receiving this medication? Side effects that you should report to your doctor or health care professional as soon as possible: allergic reactions like skin rash, itching or hives, swelling of the face, lips, or tongue signs  of infection - fever or chills, cough, sore throat, pain or difficulty passing urine signs of decreased platelets or bleeding - bruising, pinpoint red spots on the skin, black, tarry stools, nosebleeds signs of decreased red blood cells - unusually weak or tired, fainting spells, lightheadedness breathing problems changes in hearing changes in vision chest pain high blood pressure low blood counts - This drug may decrease the  number of white blood cells, red blood cells and platelets. You may be at increased risk for infections and bleeding. nausea and vomiting pain, swelling, redness or irritation at the injection site pain, tingling, numbness in the hands or feet problems with balance, talking, walking trouble passing urine or change in the amount of urine Side effects that usually do not require medical attention (report to your doctor or health care professional if they continue or are bothersome): hair loss loss of appetite metallic taste in the mouth or changes in taste This list may not describe all possible side effects. Call your doctor for medical advice about side effects. You may report side effects to FDA at 1-800-FDA-1088. Where should I keep my medication? This drug is given in a hospital or clinic and will not be stored at home. NOTE: This sheet is a summary. It may not cover all possible information. If you have questions about this medicine, talk to your doctor, pharmacist, or health care provider.  2022 Elsevier/Gold Standard (2008-03-03 00:00:00) Paclitaxel injection What is this medication? PACLITAXEL (PAK li TAX el) is a chemotherapy drug. It targets fast dividing cells, like cancer cells, and causes these cells to die. This medicine is used to treat ovarian cancer, breast cancer, lung cancer, Kaposi's sarcoma, and other cancers. This medicine may be used for other purposes; ask your health care provider or pharmacist if you have questions. COMMON BRAND NAME(S): Onxol, Taxol What should I tell my care team before I take this medication? They need to know if you have any of these conditions: history of irregular heartbeat liver disease low blood counts, like low white cell, platelet, or red cell counts lung or breathing disease, like asthma tingling of the fingers or toes, or other nerve disorder an unusual or allergic reaction to paclitaxel, alcohol, polyoxyethylated castor oil, other  chemotherapy, other medicines, foods, dyes, or preservatives pregnant or trying to get pregnant breast-feeding How should I use this medication? This drug is given as an infusion into a vein. It is administered in a hospital or clinic by a specially trained health care professional. Talk to your pediatrician regarding the use of this medicine in children. Special care may be needed. Overdosage: If you think you have taken too much of this medicine contact a poison control center or emergency room at once. NOTE: This medicine is only for you. Do not share this medicine with others. What if I miss a dose? It is important not to miss your dose. Call your doctor or health care professional if you are unable to keep an appointment. What may interact with this medication? Do not take this medicine with any of the following medications: live virus vaccines This medicine may also interact with the following medications: antiviral medicines for hepatitis, HIV or AIDS certain antibiotics like erythromycin and clarithromycin certain medicines for fungal infections like ketoconazole and itraconazole certain medicines for seizures like carbamazepine, phenobarbital, phenytoin gemfibrozil nefazodone rifampin St. John's wort This list may not describe all possible interactions. Give your health care provider a list of all the medicines, herbs, non-prescription drugs, or  dietary supplements you use. Also tell them if you smoke, drink alcohol, or use illegal drugs. Some items may interact with your medicine. What should I watch for while using this medication? Your condition will be monitored carefully while you are receiving this medicine. You will need important blood work done while you are taking this medicine. This medicine can cause serious allergic reactions. To reduce your risk you will need to take other medicine(s) before treatment with this medicine. If you experience allergic reactions like skin  rash, itching or hives, swelling of the face, lips, or tongue, tell your doctor or health care professional right away. In some cases, you may be given additional medicines to help with side effects. Follow all directions for their use. This drug may make you feel generally unwell. This is not uncommon, as chemotherapy can affect healthy cells as well as cancer cells. Report any side effects. Continue your course of treatment even though you feel ill unless your doctor tells you to stop. Call your doctor or health care professional for advice if you get a fever, chills or sore throat, or other symptoms of a cold or flu. Do not treat yourself. This drug decreases your body's ability to fight infections. Try to avoid being around people who are sick. This medicine may increase your risk to bruise or bleed. Call your doctor or health care professional if you notice any unusual bleeding. Be careful brushing and flossing your teeth or using a toothpick because you may get an infection or bleed more easily. If you have any dental work done, tell your dentist you are receiving this medicine. Avoid taking products that contain aspirin, acetaminophen, ibuprofen, naproxen, or ketoprofen unless instructed by your doctor. These medicines may hide a fever. Do not become pregnant while taking this medicine. Women should inform their doctor if they wish to become pregnant or think they might be pregnant. There is a potential for serious side effects to an unborn child. Talk to your health care professional or pharmacist for more information. Do not breast-feed an infant while taking this medicine. Men are advised not to father a child while receiving this medicine. This product may contain alcohol. Ask your pharmacist or healthcare provider if this medicine contains alcohol. Be sure to tell all healthcare providers you are taking this medicine. Certain medicines, like metronidazole and disulfiram, can cause an unpleasant  reaction when taken with alcohol. The reaction includes flushing, headache, nausea, vomiting, sweating, and increased thirst. The reaction can last from 30 minutes to several hours. What side effects may I notice from receiving this medication? Side effects that you should report to your doctor or health care professional as soon as possible: allergic reactions like skin rash, itching or hives, swelling of the face, lips, or tongue breathing problems changes in vision fast, irregular heartbeat high or low blood pressure mouth sores pain, tingling, numbness in the hands or feet signs of decreased platelets or bleeding - bruising, pinpoint red spots on the skin, black, tarry stools, blood in the urine signs of decreased red blood cells - unusually weak or tired, feeling faint or lightheaded, falls signs of infection - fever or chills, cough, sore throat, pain or difficulty passing urine signs and symptoms of liver injury like dark yellow or brown urine; general ill feeling or flu-like symptoms; light-colored stools; loss of appetite; nausea; right upper belly pain; unusually weak or tired; yellowing of the eyes or skin swelling of the ankles, feet, hands unusually slow heartbeat Side  effects that usually do not require medical attention (report to your doctor or health care professional if they continue or are bothersome): diarrhea hair loss loss of appetite muscle or joint pain nausea, vomiting pain, redness, or irritation at site where injected tiredness This list may not describe all possible side effects. Call your doctor for medical advice about side effects. You may report side effects to FDA at 1-800-FDA-1088. Where should I keep my medication? This drug is given in a hospital or clinic and will not be stored at home. NOTE: This sheet is a summary. It may not cover all possible information. If you have questions about this medicine, talk to your doctor, pharmacist, or health care  provider.  2022 Elsevier/Gold Standard (2021-06-13 00:00:00) Bevacizumab injection What is this medication? BEVACIZUMAB (be va SIZ yoo mab) is a monoclonal antibody. It is used to treat many types of cancer. This medicine may be used for other purposes; ask your health care provider or pharmacist if you have questions. COMMON BRAND NAME(S): Alymsys, Avastin, MVASI, Noah Charon What should I tell my care team before I take this medication? They need to know if you have any of these conditions: diabetes heart disease high blood pressure history of coughing up blood prior anthracycline chemotherapy (e.g., doxorubicin, daunorubicin, epirubicin) recent or ongoing radiation therapy recent or planning to have surgery stroke an unusual or allergic reaction to bevacizumab, hamster proteins, mouse proteins, other medicines, foods, dyes, or preservatives pregnant or trying to get pregnant breast-feeding How should I use this medication? This medicine is for infusion into a vein. It is given by a health care professional in a hospital or clinic setting. Talk to your pediatrician regarding the use of this medicine in children. Special care may be needed. Overdosage: If you think you have taken too much of this medicine contact a poison control center or emergency room at once. NOTE: This medicine is only for you. Do not share this medicine with others. What if I miss a dose? It is important not to miss your dose. Call your doctor or health care professional if you are unable to keep an appointment. What may interact with this medication? Interactions are not expected. This list may not describe all possible interactions. Give your health care provider a list of all the medicines, herbs, non-prescription drugs, or dietary supplements you use. Also tell them if you smoke, drink alcohol, or use illegal drugs. Some items may interact with your medicine. What should I watch for while using this  medication? Your condition will be monitored carefully while you are receiving this medicine. You will need important blood work and urine testing done while you are taking this medicine. This medicine may increase your risk to bruise or bleed. Call your doctor or health care professional if you notice any unusual bleeding. Before having surgery, talk to your health care provider to make sure it is ok. This drug can increase the risk of poor healing of your surgical site or wound. You will need to stop this drug for 28 days before surgery. After surgery, wait at least 28 days before restarting this drug. Make sure the surgical site or wound is healed enough before restarting this drug. Talk to your health care provider if questions. Do not become pregnant while taking this medicine or for 6 months after stopping it. Women should inform their doctor if they wish to become pregnant or think they might be pregnant. There is a potential for serious side effects to an  unborn child. Talk to your health care professional or pharmacist for more information. Do not breast-feed an infant while taking this medicine and for 6 months after the last dose. This medicine has caused ovarian failure in some women. This medicine may interfere with the ability to have a child. You should talk to your doctor or health care professional if you are concerned about your fertility. What side effects may I notice from receiving this medication? Side effects that you should report to your doctor or health care professional as soon as possible: allergic reactions like skin rash, itching or hives, swelling of the face, lips, or tongue chest pain or chest tightness chills coughing up blood high fever seizures severe constipation signs and symptoms of bleeding such as bloody or black, tarry stools; red or dark-brown urine; spitting up blood or brown material that looks like coffee grounds; red spots on the skin; unusual bruising or  bleeding from the eye, gums, or nose signs and symptoms of a blood clot such as breathing problems; chest pain; severe, sudden headache; pain, swelling, warmth in the leg signs and symptoms of a stroke like changes in vision; confusion; trouble speaking or understanding; severe headaches; sudden numbness or weakness of the face, arm or leg; trouble walking; dizziness; loss of balance or coordination stomach pain sweating swelling of legs or ankles vomiting weight gain Side effects that usually do not require medical attention (report to your doctor or health care professional if they continue or are bothersome): back pain changes in taste decreased appetite dry skin nausea tiredness This list may not describe all possible side effects. Call your doctor for medical advice about side effects. You may report side effects to FDA at 1-800-FDA-1088. Where should I keep my medication? This drug is given in a hospital or clinic and will not be stored at home. NOTE: This sheet is a summary. It may not cover all possible information. If you have questions about this medicine, talk to your doctor, pharmacist, or health care provider.  2022 Elsevier/Gold Standard (2021-06-13 00:00:00)

## 2021-08-23 NOTE — Progress Notes (Signed)
Pt states that she has blisters on her sacral region. BP 150/94 but states she did take bp meds this morning.

## 2021-08-23 NOTE — Progress Notes (Signed)
Nutrition Follow-up:  Patient with stage IV lung cancer with brain mets.  Patient has completed whole brain radiation. Receiving carbo, taxol and avastin.  Met with patient during infusion.  Patient reports that mouth is sore.  Noted white patches on tongue.  Patient reports that she was planning dental work prior to cancer diagnosis but this has been put on hold.  Also has poor dentition and trouble chewing.  Reports that appetite is not that good.  Drinking 5-6 ensure shakes a day.  "I can drink those."      Medications: remeron, decadron  Labs: reviewed  Anthropometrics:   Weight 124 lb 11.2 oz today  125 lb 11.2 oz on 10/26  126 lb on 9/28   NUTRITION DIAGNOSIS: Inadequate oral intake continues   INTERVENTION:  Continue oral nutrition supplements, 350 calorie or higher shakes. Coupons given Sent message to MD regarding thrush and patient currently not on treatment Discussed ways to add calories and protein in current eating pattern (lactaid milk to oatmeal, butter, etc).   Provided patient handout on soft, moist protein foods    MONITORING, EVALUATION, GOAL: weight trends, intake   NEXT VISIT: Wednesday, Dec 7 during infusion  Nastasia Kage B. Zenia Resides, [place], [place] Registered Dietitian [phone] (mobile)

## 2021-08-24 ENCOUNTER — Encounter: Payer: Self-pay | Admitting: Oncology

## 2021-08-25 ENCOUNTER — Inpatient Hospital Stay: Payer: Medicare Other

## 2021-08-25 ENCOUNTER — Other Ambulatory Visit: Payer: Self-pay

## 2021-08-25 DIAGNOSIS — Z5112 Encounter for antineoplastic immunotherapy: Secondary | ICD-10-CM | POA: Diagnosis not present

## 2021-08-25 DIAGNOSIS — C3411 Malignant neoplasm of upper lobe, right bronchus or lung: Secondary | ICD-10-CM

## 2021-08-25 MED ORDER — PEGFILGRASTIM-BMEZ 6 MG/0.6ML ~~LOC~~ SOSY
6.0000 mg | PREFILLED_SYRINGE | Freq: Once | SUBCUTANEOUS | Status: AC
  Administered 2021-08-25: 6 mg via SUBCUTANEOUS
  Filled 2021-08-25: qty 0.6

## 2021-08-29 ENCOUNTER — Other Ambulatory Visit: Payer: Medicare Other | Admitting: Nurse Practitioner

## 2021-08-29 ENCOUNTER — Telehealth: Payer: Self-pay | Admitting: Nurse Practitioner

## 2021-08-29 ENCOUNTER — Other Ambulatory Visit: Payer: Self-pay

## 2021-08-29 NOTE — Telephone Encounter (Signed)
I attempted to call home phone with no answer to confirm in f/u PC visit. I attempted to call cell phone with busy >45 minutes, unable to leave a message. I went to Sandra Jordan's home for f/u pc visit, no one home. Will put on reschedule list

## 2021-08-30 ENCOUNTER — Emergency Department: Payer: Medicare Other

## 2021-08-30 ENCOUNTER — Telehealth: Payer: Self-pay | Admitting: *Deleted

## 2021-08-30 ENCOUNTER — Inpatient Hospital Stay
Admission: EM | Admit: 2021-08-30 | Discharge: 2021-09-08 | DRG: 871 | Disposition: A | Discharge status: Hospice | Payer: Medicare Other | Attending: Internal Medicine | Admitting: Internal Medicine

## 2021-08-30 ENCOUNTER — Encounter: Payer: Self-pay | Admitting: Emergency Medicine

## 2021-08-30 ENCOUNTER — Other Ambulatory Visit: Payer: Self-pay

## 2021-08-30 DIAGNOSIS — G929 Unspecified toxic encephalopathy: Secondary | ICD-10-CM | POA: Diagnosis not present

## 2021-08-30 DIAGNOSIS — R6521 Severe sepsis with septic shock: Secondary | ICD-10-CM | POA: Diagnosis present

## 2021-08-30 DIAGNOSIS — J9601 Acute respiratory failure with hypoxia: Secondary | ICD-10-CM | POA: Diagnosis present

## 2021-08-30 DIAGNOSIS — G893 Neoplasm related pain (acute) (chronic): Secondary | ICD-10-CM | POA: Diagnosis present

## 2021-08-30 DIAGNOSIS — Z803 Family history of malignant neoplasm of breast: Secondary | ICD-10-CM

## 2021-08-30 DIAGNOSIS — R509 Fever, unspecified: Secondary | ICD-10-CM

## 2021-08-30 DIAGNOSIS — L89150 Pressure ulcer of sacral region, unstageable: Secondary | ICD-10-CM | POA: Diagnosis present

## 2021-08-30 DIAGNOSIS — Z801 Family history of malignant neoplasm of trachea, bronchus and lung: Secondary | ICD-10-CM

## 2021-08-30 DIAGNOSIS — R64 Cachexia: Secondary | ICD-10-CM | POA: Diagnosis present

## 2021-08-30 DIAGNOSIS — E785 Hyperlipidemia, unspecified: Secondary | ICD-10-CM | POA: Diagnosis present

## 2021-08-30 DIAGNOSIS — B955 Unspecified streptococcus as the cause of diseases classified elsewhere: Secondary | ICD-10-CM | POA: Diagnosis present

## 2021-08-30 DIAGNOSIS — C3411 Malignant neoplasm of upper lobe, right bronchus or lung: Secondary | ICD-10-CM | POA: Diagnosis present

## 2021-08-30 DIAGNOSIS — K760 Fatty (change of) liver, not elsewhere classified: Secondary | ICD-10-CM | POA: Diagnosis present

## 2021-08-30 DIAGNOSIS — Z66 Do not resuscitate: Secondary | ICD-10-CM | POA: Diagnosis present

## 2021-08-30 DIAGNOSIS — Z79899 Other long term (current) drug therapy: Secondary | ICD-10-CM

## 2021-08-30 DIAGNOSIS — R531 Weakness: Secondary | ICD-10-CM

## 2021-08-30 DIAGNOSIS — G9341 Metabolic encephalopathy: Secondary | ICD-10-CM | POA: Diagnosis present

## 2021-08-30 DIAGNOSIS — R627 Adult failure to thrive: Secondary | ICD-10-CM | POA: Diagnosis present

## 2021-08-30 DIAGNOSIS — R300 Dysuria: Secondary | ICD-10-CM | POA: Diagnosis present

## 2021-08-30 DIAGNOSIS — E860 Dehydration: Secondary | ICD-10-CM | POA: Diagnosis present

## 2021-08-30 DIAGNOSIS — A419 Sepsis, unspecified organism: Principal | ICD-10-CM | POA: Diagnosis present

## 2021-08-30 DIAGNOSIS — C779 Secondary and unspecified malignant neoplasm of lymph node, unspecified: Secondary | ICD-10-CM | POA: Diagnosis present

## 2021-08-30 DIAGNOSIS — E871 Hypo-osmolality and hyponatremia: Secondary | ICD-10-CM | POA: Diagnosis present

## 2021-08-30 DIAGNOSIS — Z6821 Body mass index (BMI) 21.0-21.9, adult: Secondary | ICD-10-CM

## 2021-08-30 DIAGNOSIS — Z9114 Patient's other noncompliance with medication regimen: Secondary | ICD-10-CM

## 2021-08-30 DIAGNOSIS — Z515 Encounter for palliative care: Secondary | ICD-10-CM

## 2021-08-30 DIAGNOSIS — R54 Age-related physical debility: Secondary | ICD-10-CM | POA: Diagnosis present

## 2021-08-30 DIAGNOSIS — K7689 Other specified diseases of liver: Secondary | ICD-10-CM | POA: Diagnosis present

## 2021-08-30 DIAGNOSIS — E870 Hyperosmolality and hypernatremia: Secondary | ICD-10-CM | POA: Diagnosis present

## 2021-08-30 DIAGNOSIS — N179 Acute kidney failure, unspecified: Secondary | ICD-10-CM | POA: Diagnosis present

## 2021-08-30 DIAGNOSIS — Z20822 Contact with and (suspected) exposure to covid-19: Secondary | ICD-10-CM | POA: Diagnosis present

## 2021-08-30 DIAGNOSIS — N390 Urinary tract infection, site not specified: Secondary | ICD-10-CM | POA: Diagnosis present

## 2021-08-30 DIAGNOSIS — Z800 Family history of malignant neoplasm of digestive organs: Secondary | ICD-10-CM

## 2021-08-30 DIAGNOSIS — Z87891 Personal history of nicotine dependence: Secondary | ICD-10-CM

## 2021-08-30 DIAGNOSIS — C7931 Secondary malignant neoplasm of brain: Secondary | ICD-10-CM | POA: Diagnosis present

## 2021-08-30 DIAGNOSIS — N170 Acute kidney failure with tubular necrosis: Secondary | ICD-10-CM | POA: Diagnosis present

## 2021-08-30 DIAGNOSIS — B954 Other streptococcus as the cause of diseases classified elsewhere: Secondary | ICD-10-CM | POA: Diagnosis present

## 2021-08-30 DIAGNOSIS — E876 Hypokalemia: Secondary | ICD-10-CM | POA: Diagnosis not present

## 2021-08-30 DIAGNOSIS — I10 Essential (primary) hypertension: Secondary | ICD-10-CM | POA: Diagnosis present

## 2021-08-30 DIAGNOSIS — L89899 Pressure ulcer of other site, unspecified stage: Secondary | ICD-10-CM | POA: Diagnosis present

## 2021-08-30 DIAGNOSIS — E162 Hypoglycemia, unspecified: Secondary | ICD-10-CM | POA: Diagnosis present

## 2021-08-30 DIAGNOSIS — D638 Anemia in other chronic diseases classified elsewhere: Secondary | ICD-10-CM | POA: Diagnosis present

## 2021-08-30 LAB — BASIC METABOLIC PANEL
Anion gap: 20 — ABNORMAL HIGH (ref 5–15)
BUN: 39 mg/dL — ABNORMAL HIGH (ref 8–23)
CO2: 22 mmol/L (ref 22–32)
Calcium: 9.3 mg/dL (ref 8.9–10.3)
Chloride: 107 mmol/L (ref 98–111)
Creatinine, Ser: 1.98 mg/dL — ABNORMAL HIGH (ref 0.44–1.00)
GFR, Estimated: 27 mL/min — ABNORMAL LOW (ref >60)
Glucose, Bld: 104 mg/dL — ABNORMAL HIGH (ref 70–99)
Potassium: 3.4 mmol/L — ABNORMAL LOW (ref 3.5–5.1)
Sodium: 149 mmol/L — ABNORMAL HIGH (ref 135–145)

## 2021-08-30 LAB — CBC
HCT: 39 % (ref 36.0–46.0)
Hemoglobin: 12.4 g/dL (ref 12.0–15.0)
MCH: 29.7 pg (ref 26.0–34.0)
MCHC: 31.8 g/dL (ref 30.0–36.0)
MCV: 93.3 fL (ref 80.0–100.0)
Platelets: 195 10*3/uL (ref 150–400)
RBC: 4.18 MIL/uL (ref 3.87–5.11)
RDW: 20.9 % — ABNORMAL HIGH (ref 11.5–15.5)
WBC: 10.6 10*3/uL — ABNORMAL HIGH (ref 4.0–10.5)
nRBC: 3.1 % — ABNORMAL HIGH (ref 0.0–0.2)

## 2021-08-30 LAB — RESP PANEL BY RT-PCR (FLU A&B, COVID) ARPGX2
Influenza A by PCR: NEGATIVE
Influenza B by PCR: NEGATIVE
SARS Coronavirus 2 by RT PCR: NEGATIVE

## 2021-08-30 LAB — CK: Total CK: 27 U/L — ABNORMAL LOW (ref 38–234)

## 2021-08-30 LAB — LACTIC ACID, PLASMA: Lactic Acid, Venous: 1.9 mmol/L (ref 0.5–1.9)

## 2021-08-30 LAB — TROPONIN I (HIGH SENSITIVITY): Troponin I (High Sensitivity): 68 ng/L — ABNORMAL HIGH (ref <18)

## 2021-08-30 LAB — CBG MONITORING, ED: Glucose-Capillary: 124 mg/dL — ABNORMAL HIGH (ref 70–99)

## 2021-08-30 MED ORDER — LACTATED RINGERS IV BOLUS
1000.0000 mL | Freq: Once | INTRAVENOUS | Status: AC
  Administered 2021-08-30: 1000 mL via INTRAVENOUS

## 2021-08-30 NOTE — ED Triage Notes (Signed)
Pt comes into the ED via [facility] from home c/o increased weakness and a failure to thrive.  Pt has not been taking her medications for a couple days and she has been having increased fatigue and decreased appetite.  Pt currently doesn't know why she is here and presents with family who states they thought she was taking her medication.  Pt is currently being treated for stage 4 lung cancer with mets to the brain.

## 2021-08-30 NOTE — ED Notes (Signed)
Lab only able to collect green top at this time.  Rest of blood work needs to be collected at time of IV insertion.

## 2021-08-30 NOTE — Telephone Encounter (Signed)
Call returned to niece and advised that patient needs to stay in ER per physician and NP. She agreed to stay there

## 2021-08-30 NOTE — Telephone Encounter (Signed)
Niece called reporting that patient is in ER waiting room with 15 people ahead of her and is asking if we can see her sooner than ER will see her. Please advise

## 2021-08-30 NOTE — ED Notes (Signed)
Pt taken to CT at this time. Will access port when pt returns

## 2021-08-30 NOTE — ED Triage Notes (Incomplete)
Pt in via EMS from home with hx of lung ca and is non complaint with her meds for 3 days. EMS not

## 2021-08-30 NOTE — ED Provider Notes (Signed)
[facility] Emergency Department Provider Note   ____________________________________________   I have reviewed the triage vital signs and the nursing notes.   HISTORY  Chief Complaint Weakness   History limited by: Confusion, history primarily obtained from daughter at bedside   HPI Sandra Jordan is a 70 y.o. female who presents to the emergency department today because of concerns for weakness and confusion.  Family states that they last talked to her 2 nights ago and she seemed to be at her baseline.  However her baseline has been declining recently.  She does have history of metastatic cancer and is been recently not eating or drinking well.  Today when family checked on her they found her to be lying on her bed with feces around it.  The patient herself states she has been having some discomfort with urination recently.  No reported fevers.  Records reviewed. Per medical record review patient has a history of HLD, HTN  Past Medical History:  Diagnosis Date   Allergy    Colon polyp    Hyperlipidemia    Hypertension    Lung cancer [facility])     Patient Active Problem List   Diagnosis Date Noted   Primary adenocarcinoma of upper lobe of right lung ([place]) 06/27/2021   Brain mass 06/12/2021   Mass of upper lobe of right lung 06/12/2021   Hypokalemia 06/12/2021   Hyperlipidemia 03/08/2017   Hypertension 03/08/2017   AR (allergic rhinitis) 05/17/2016   Colon polyps 05/17/2016   Fibrocystic breast changes, bilateral 05/11/2014    Past Surgical History:  Procedure Laterality Date   ABDOMINAL HYSTERECTOMY  1984   BREAST BIOPSY Bilateral 1990   fibrocystic breast   BREAST BIOPSY Right August 2014   fibrocystic breast   CHOLECYSTECTOMY  2008   COLONOSCOPY  2012, 2017   [facility]   IR CV LINE INJECTION  07/07/2021   IR IMAGING GUIDED PORT INSERTION  06/20/2021   IR IMAGING GUIDED PORT INSERTION  07/24/2021    Prior to Admission medications    Medication Sig Start Date End Date Taking? Authorizing Provider  cetirizine (ZYRTEC) 10 MG tablet Take 10 mg by mouth daily. Patient not taking: No sig reported    [provider]  cyanocobalamin 1000 MCG tablet Take by mouth. Patient not taking: No sig reported    [provider]  dexamethasone (DECADRON) 4 MG tablet Take 1 tablet (4 mg total) by mouth daily. 08/21/21   Lloyd Huger, MD  fluconazole (DIFLUCAN) 100 MG tablet Take 1 tablet (100 mg total) by mouth daily. 07/28/21   Lloyd Huger, MD  fluticasone (FLONASE) 50 MCG/ACT nasal spray Place into both nostrils. Patient not taking: No sig reported 06/07/21   [provider]  hydrALAZINE (APRESOLINE) 25 MG tablet Take 25 mg by mouth 2 (two) times daily. 06/07/21   [provider]  hydrochlorothiazide (HYDRODIURIL) 25 MG tablet Take 25 mg by mouth daily. Prn if bp above 150/90    [provider]  ibuprofen (ADVIL) 800 MG tablet Take 800 mg by mouth every 6 (six) hours as needed. Patient not taking: No sig reported 03/29/21   [provider]  lidocaine-prilocaine (EMLA) cream Apply 1 application topically as needed. 06/26/21   Borders, Kirt Boys, NP  lovastatin (MEVACOR) 20 MG tablet Take 20 mg by mouth at bedtime.    [provider]  lovastatin (MEVACOR) 40 MG tablet SMARTSIG:1 Tablet(s) By Mouth Every Evening 04/14/21   [provider]  meloxicam (MOBIC) 7.5 MG tablet Take 7.5 mg by mouth daily. Patient not taking: No sig reported 06/12/21   [provider]  mirtazapine (REMERON) 15 MG tablet Take 2 tablets (30 mg total) by mouth at bedtime. 08/23/21   Lloyd Huger, MD  ondansetron (ZOFRAN-ODT) 8 MG disintegrating tablet Take 1 tablet (8 mg total) by mouth every 8 (eight) hours as needed for nausea or vomiting. Patient not taking: No sig reported 07/16/21   Jacquelin Hawking, NP  potassium chloride (KLOR-CON) 10 MEQ tablet Take 2 tablets (20 mEq  total) by mouth daily. 08/02/21   Lloyd Huger, MD  potassium chloride 20 MEQ/15ML (10%) SOLN Take 15 mLs (20 mEq total) by mouth 2 (two) times daily. 06/26/21   Borders, Kirt Boys, NP  prochlorperazine (COMPAZINE) 10 MG tablet Take 1 tablet (10 mg total) by mouth every 6 (six) hours as needed for nausea or vomiting. Patient not taking: No sig reported 06/26/21   Borders, Kirt Boys, NP    Allergies Patient has no known allergies.  Family History  Problem Relation Age of Onset   Lung cancer Mother    Colon cancer Father 45   Prostate cancer Father    Lung cancer Sister    Lung cancer Maternal Uncle    Stomach cancer Maternal Grandfather    Breast cancer Niece     Social History Social History   Tobacco Use   Smoking status: Former    Years: 15.00    Types: Cigarettes    Quit date: 10/08/2004    Years since quitting: 16.9   Smokeless tobacco: Never  Substance Use Topics   Alcohol use: No    Alcohol/week: 0.0 standard drinks   Drug use: No    Review of Systems Constitutional: No fever/chills Eyes: No visual changes. ENT: No sore throat. Cardiovascular: Denies chest pain. Respiratory: Denies shortness of breath. Gastrointestinal: No abdominal pain.  No nausea, no vomiting.  No diarrhea.   Genitourinary: Positive for dysuria.  Musculoskeletal: Negative for back pain. Skin: Negative for rash. Neurological: Negative for headaches, focal weakness or numbness.  ____________________________________________   PHYSICAL EXAM:  VITAL SIGNS: ED Triage Vitals  Enc Vitals Group     BP 08/30/21 1411 122/75     Pulse Rate 08/30/21 1411 62     Resp 08/30/21 1411 18     Temp 08/30/21 1411 98.3 F (36.8 C)     Temp Source 08/30/21 1411 Oral     SpO2 08/30/21 1411 97 %     Weight 08/30/21 1409 124 lb 11.2 oz (56.6 kg)     Height 08/30/21 1409 5' 3.5" (1.613 m)     Head Circumference --      Peak Flow --      Pain Score 08/30/21 1409 0   Constitutional: Not completely  oriented.  Eyes: Conjunctivae are normal.  ENT      Head: Normocephalic and atraumatic.      Nose: No congestion/rhinnorhea.      Mouth/Throat: Mucous membranes are moist.      Neck: No stridor. Hematological/Lymphatic/Immunilogical: No cervical lymphadenopathy. Cardiovascular: Normal rate, regular rhythm.  No murmurs, rubs, or gallops.  Respiratory: Normal respiratory effort without tachypnea nor retractions. Breath sounds are clear and equal bilaterally. No wheezes/rales/rhonchi. Gastrointestinal: Soft and non tender. No rebound. No guarding.  Genitourinary: Deferred Musculoskeletal: Normal range of motion in all extremities. No lower extremity edema. Neurologic:  Not completely oriented. Moving all extremities.  Skin:  Skin is warm, dry  and intact. No rash noted. Psychiatric: Mood and affect are normal. Speech and behavior are normal. Patient exhibits appropriate insight and judgment.  ____________________________________________    LABS (pertinent positives/negatives)  BMP na 149, k 3.4, glu 104, cr 1.98  ____________________________________________   EKG  I, Nance Pear, attending physician, personally viewed and interpreted this EKG  EKG Time: 1427 Rate: 126 Rhythm: sinus tachycardia Axis: normal Intervals: qtc 448 QRS: narrow, q waves v1 ST changes: no st elevation Impression: abnormal ekg  ____________________________________________    RADIOLOGY  CT head No acute abnormality  ____________________________________________   PROCEDURES  Procedures  ____________________________________________   INITIAL IMPRESSION / ASSESSMENT AND PLAN / ED COURSE  Pertinent labs & imaging results that were available during my care of the patient were reviewed by me and considered in my medical decision making (see chart for details).   Patient presented to the emergency department today because of concerns for some confusion weakness and being found lying on her  bed for an unknown length of time.  Only BMP was able to be collected from triage.  This does show findings concerning for acute kidney injury.  Discussed this finding with patient daughter.  Will plan on fluids.  Did order further blood work as well as head CT given confusion.  Awaiting further blood work at time of signout.  Anticipate admission.  ___________________________________________   FINAL CLINICAL IMPRESSION(S) / ED DIAGNOSES  Final diagnoses:  AKI (acute kidney injury) ([place])  Weakness     Note: This dictation was prepared with Dragon dictation. Any transcriptional errors that result from this process are unintentional     Nance Pear, MD 08/30/21 2326

## 2021-08-30 NOTE — Telephone Encounter (Signed)
FYI, patient is on her way to the ER via EMS, Niece called reporting that they were unable to reach patient for 2 days and when family went by there home where she and her brother reside, she was found in bed covered in feces and though responsive, she was "out of it". She has not been taking her medications as she is supposed to be either. The brother did not want EMS called for patient, but niece took it upon herself to call anyway

## 2021-08-31 ENCOUNTER — Emergency Department: Payer: Medicare Other

## 2021-08-31 DIAGNOSIS — C3411 Malignant neoplasm of upper lobe, right bronchus or lung: Secondary | ICD-10-CM | POA: Diagnosis present

## 2021-08-31 DIAGNOSIS — E870 Hyperosmolality and hypernatremia: Secondary | ICD-10-CM

## 2021-08-31 DIAGNOSIS — R64 Cachexia: Secondary | ICD-10-CM | POA: Diagnosis present

## 2021-08-31 DIAGNOSIS — Z515 Encounter for palliative care: Secondary | ICD-10-CM | POA: Diagnosis not present

## 2021-08-31 DIAGNOSIS — R300 Dysuria: Secondary | ICD-10-CM | POA: Diagnosis not present

## 2021-08-31 DIAGNOSIS — L89150 Pressure ulcer of sacral region, unstageable: Secondary | ICD-10-CM | POA: Diagnosis present

## 2021-08-31 DIAGNOSIS — A419 Sepsis, unspecified organism: Secondary | ICD-10-CM | POA: Diagnosis present

## 2021-08-31 DIAGNOSIS — G929 Unspecified toxic encephalopathy: Secondary | ICD-10-CM | POA: Diagnosis not present

## 2021-08-31 DIAGNOSIS — K7689 Other specified diseases of liver: Secondary | ICD-10-CM | POA: Diagnosis present

## 2021-08-31 DIAGNOSIS — E871 Hypo-osmolality and hyponatremia: Secondary | ICD-10-CM | POA: Diagnosis present

## 2021-08-31 DIAGNOSIS — J9601 Acute respiratory failure with hypoxia: Secondary | ICD-10-CM | POA: Diagnosis present

## 2021-08-31 DIAGNOSIS — C7931 Secondary malignant neoplasm of brain: Secondary | ICD-10-CM | POA: Diagnosis present

## 2021-08-31 DIAGNOSIS — N170 Acute kidney failure with tubular necrosis: Secondary | ICD-10-CM | POA: Diagnosis present

## 2021-08-31 DIAGNOSIS — Z66 Do not resuscitate: Secondary | ICD-10-CM | POA: Diagnosis present

## 2021-08-31 DIAGNOSIS — R627 Adult failure to thrive: Secondary | ICD-10-CM | POA: Diagnosis present

## 2021-08-31 DIAGNOSIS — G9341 Metabolic encephalopathy: Secondary | ICD-10-CM | POA: Diagnosis present

## 2021-08-31 DIAGNOSIS — N179 Acute kidney failure, unspecified: Secondary | ICD-10-CM | POA: Diagnosis not present

## 2021-08-31 DIAGNOSIS — I10 Essential (primary) hypertension: Secondary | ICD-10-CM | POA: Diagnosis present

## 2021-08-31 DIAGNOSIS — C779 Secondary and unspecified malignant neoplasm of lymph node, unspecified: Secondary | ICD-10-CM | POA: Diagnosis present

## 2021-08-31 DIAGNOSIS — R531 Weakness: Secondary | ICD-10-CM

## 2021-08-31 DIAGNOSIS — Z20822 Contact with and (suspected) exposure to covid-19: Secondary | ICD-10-CM | POA: Diagnosis present

## 2021-08-31 DIAGNOSIS — K760 Fatty (change of) liver, not elsewhere classified: Secondary | ICD-10-CM | POA: Diagnosis present

## 2021-08-31 DIAGNOSIS — R6521 Severe sepsis with septic shock: Secondary | ICD-10-CM | POA: Diagnosis present

## 2021-08-31 DIAGNOSIS — B954 Other streptococcus as the cause of diseases classified elsewhere: Secondary | ICD-10-CM | POA: Diagnosis present

## 2021-08-31 DIAGNOSIS — Z9114 Patient's other noncompliance with medication regimen: Secondary | ICD-10-CM | POA: Diagnosis not present

## 2021-08-31 DIAGNOSIS — N390 Urinary tract infection, site not specified: Secondary | ICD-10-CM | POA: Diagnosis present

## 2021-08-31 DIAGNOSIS — D638 Anemia in other chronic diseases classified elsewhere: Secondary | ICD-10-CM | POA: Diagnosis present

## 2021-08-31 DIAGNOSIS — Z7189 Other specified counseling: Secondary | ICD-10-CM | POA: Diagnosis not present

## 2021-08-31 LAB — HEPATIC FUNCTION PANEL
ALT: 135 U/L — ABNORMAL HIGH (ref 0–44)
AST: 63 U/L — ABNORMAL HIGH (ref 15–41)
Albumin: 3 g/dL — ABNORMAL LOW (ref 3.5–5.0)
Alkaline Phosphatase: 293 U/L — ABNORMAL HIGH (ref 38–126)
Bilirubin, Direct: 0.1 mg/dL (ref 0.0–0.2)
Indirect Bilirubin: 1 mg/dL — ABNORMAL HIGH (ref 0.3–0.9)
Total Bilirubin: 1.1 mg/dL (ref 0.3–1.2)
Total Protein: 6.2 g/dL — ABNORMAL LOW (ref 6.5–8.1)

## 2021-08-31 LAB — TROPONIN I (HIGH SENSITIVITY): Troponin I (High Sensitivity): 54 ng/L — ABNORMAL HIGH (ref <18)

## 2021-08-31 LAB — COMPREHENSIVE METABOLIC PANEL
ALT: 119 U/L — ABNORMAL HIGH (ref 0–44)
AST: 50 U/L — ABNORMAL HIGH (ref 15–41)
Albumin: 2.8 g/dL — ABNORMAL LOW (ref 3.5–5.0)
Alkaline Phosphatase: 267 U/L — ABNORMAL HIGH (ref 38–126)
Anion gap: 10 (ref 5–15)
BUN: 31 mg/dL — ABNORMAL HIGH (ref 8–23)
CO2: 27 mmol/L (ref 22–32)
Calcium: 8.6 mg/dL — ABNORMAL LOW (ref 8.9–10.3)
Chloride: 114 mmol/L — ABNORMAL HIGH (ref 98–111)
Creatinine, Ser: 1.01 mg/dL — ABNORMAL HIGH (ref 0.44–1.00)
GFR, Estimated: >60 mL/min (ref >60)
Glucose, Bld: 123 mg/dL — ABNORMAL HIGH (ref 70–99)
Potassium: 2.9 mmol/L — ABNORMAL LOW (ref 3.5–5.1)
Sodium: 151 mmol/L — ABNORMAL HIGH (ref 135–145)
Total Bilirubin: 1 mg/dL (ref 0.3–1.2)
Total Protein: 5.9 g/dL — ABNORMAL LOW (ref 6.5–8.1)

## 2021-08-31 LAB — RENAL FUNCTION PANEL
Albumin: 2.4 g/dL — ABNORMAL LOW (ref 3.5–5.0)
Anion gap: 6 (ref 5–15)
BUN: 19 mg/dL (ref 8–23)
CO2: 26 mmol/L (ref 22–32)
Calcium: 7.9 mg/dL — ABNORMAL LOW (ref 8.9–10.3)
Chloride: 121 mmol/L — ABNORMAL HIGH (ref 98–111)
Creatinine, Ser: 0.86 mg/dL (ref 0.44–1.00)
GFR, Estimated: >60 mL/min (ref >60)
Glucose, Bld: 146 mg/dL — ABNORMAL HIGH (ref 70–99)
Phosphorus: <1 mg/dL — CL (ref 2.5–4.6)
Potassium: 3 mmol/L — ABNORMAL LOW (ref 3.5–5.1)
Sodium: 153 mmol/L — ABNORMAL HIGH (ref 135–145)

## 2021-08-31 LAB — CBC
HCT: 34.2 % — ABNORMAL LOW (ref 36.0–46.0)
Hemoglobin: 10.6 g/dL — ABNORMAL LOW (ref 12.0–15.0)
MCH: 29.4 pg (ref 26.0–34.0)
MCHC: 31 g/dL (ref 30.0–36.0)
MCV: 94.7 fL (ref 80.0–100.0)
Platelets: 170 10*3/uL (ref 150–400)
RBC: 3.61 MIL/uL — ABNORMAL LOW (ref 3.87–5.11)
RDW: 20.8 % — ABNORMAL HIGH (ref 11.5–15.5)
WBC: 10.6 10*3/uL — ABNORMAL HIGH (ref 4.0–10.5)
nRBC: 2.6 % — ABNORMAL HIGH (ref 0.0–0.2)

## 2021-08-31 LAB — URINALYSIS, COMPLETE (UACMP) WITH MICROSCOPIC
Bilirubin Urine: NEGATIVE
Glucose, UA: NEGATIVE mg/dL
Hgb urine dipstick: NEGATIVE
Ketones, ur: 5 mg/dL — AB
Nitrite: NEGATIVE
Protein, ur: NEGATIVE mg/dL
Specific Gravity, Urine: 1.01 (ref 1.005–1.030)
pH: 5 (ref 5.0–8.0)

## 2021-08-31 LAB — GLUCOSE, CAPILLARY: Glucose-Capillary: 136 mg/dL — ABNORMAL HIGH (ref 70–99)

## 2021-08-31 LAB — PROCALCITONIN: Procalcitonin: 1.28 ng/mL

## 2021-08-31 LAB — TSH: TSH: 4.384 u[IU]/mL (ref 0.350–4.500)

## 2021-08-31 MED ORDER — POTASSIUM CHLORIDE 2 MEQ/ML IV SOLN: INTRAVENOUS | Status: DC

## 2021-08-31 MED ORDER — SODIUM CHLORIDE 0.9 % IV SOLN: INTRAVENOUS | Status: DC

## 2021-08-31 MED ORDER — KCL IN DEXTROSE-NACL 20-5-0.45 MEQ/L-%-% IV SOLN
INTRAVENOUS | Status: DC
  Filled 2021-08-31 (×3): qty 1000

## 2021-08-31 MED ORDER — CHLORHEXIDINE GLUCONATE CLOTH 2 % EX PADS
6.0000 | MEDICATED_PAD | Freq: Every day | CUTANEOUS | Status: DC
  Administered 2021-08-31 – 2021-09-07 (×8): 6 via TOPICAL

## 2021-08-31 MED ORDER — ACETAMINOPHEN 325 MG PO TABS
650.0000 mg | ORAL_TABLET | Freq: Four times a day (QID) | ORAL | Status: DC | PRN
  Filled 2021-08-31: qty 2

## 2021-08-31 MED ORDER — VANCOMYCIN HCL IN DEXTROSE 1-5 GM/200ML-% IV SOLN
1000.0000 mg | Freq: Once | INTRAVENOUS | Status: AC
  Administered 2021-08-31: 1000 mg via INTRAVENOUS
  Filled 2021-08-31: qty 200

## 2021-08-31 MED ORDER — INSULIN ASPART 100 UNIT/ML IJ SOLN
0.0000 [IU] | INTRAMUSCULAR | Status: DC
  Administered 2021-08-31: 1 [IU] via SUBCUTANEOUS
  Administered 2021-09-01: 2 [IU] via SUBCUTANEOUS
  Filled 2021-08-31 (×2): qty 1

## 2021-08-31 MED ORDER — POTASSIUM CHLORIDE 10 MEQ/100ML IV SOLN
10.0000 meq | INTRAVENOUS | Status: AC
  Administered 2021-08-31 (×2): 10 meq via INTRAVENOUS
  Filled 2021-08-31 (×2): qty 100

## 2021-08-31 MED ORDER — SODIUM CHLORIDE 0.9 % IV SOLN
2.0000 g | Freq: Once | INTRAVENOUS | Status: AC
  Administered 2021-08-31: 2 g via INTRAVENOUS
  Filled 2021-08-31: qty 2

## 2021-08-31 MED ORDER — LACTATED RINGERS IV SOLN: INTRAVENOUS | Status: DC

## 2021-08-31 MED ORDER — POTASSIUM CHLORIDE 10 MEQ/100ML IV SOLN
10.0000 meq | INTRAVENOUS | Status: AC
  Administered 2021-08-31 – 2021-09-01 (×6): 10 meq via INTRAVENOUS
  Filled 2021-08-31 (×6): qty 100

## 2021-08-31 MED ORDER — POTASSIUM PHOSPHATES 15 MMOLE/5ML IV SOLN
30.0000 mmol | Freq: Once | INTRAVENOUS | Status: AC
  Administered 2021-09-01: 30 mmol via INTRAVENOUS
  Filled 2021-08-31: qty 10

## 2021-08-31 MED ORDER — DEXTROSE 5 % IV SOLN: INTRAVENOUS | Status: DC

## 2021-08-31 MED ORDER — POTASSIUM CHLORIDE CRYS ER 20 MEQ PO TBCR
40.0000 meq | EXTENDED_RELEASE_TABLET | ORAL | Status: DC
  Filled 2021-08-31: qty 2

## 2021-08-31 MED ORDER — LACTATED RINGERS IV BOLUS
1000.0000 mL | Freq: Once | INTRAVENOUS | Status: AC
  Administered 2021-08-31: 1000 mL via INTRAVENOUS

## 2021-08-31 MED ORDER — ACETAMINOPHEN 650 MG RE SUPP
650.0000 mg | Freq: Four times a day (QID) | RECTAL | Status: DC | PRN
  Administered 2021-08-31 – 2021-09-01 (×2): 650 mg via RECTAL
  Filled 2021-08-31 (×2): qty 1

## 2021-08-31 MED ORDER — POTASSIUM CHLORIDE CRYS ER 20 MEQ PO TBCR
40.0000 meq | EXTENDED_RELEASE_TABLET | Freq: Once | ORAL | Status: DC
  Filled 2021-08-31: qty 2

## 2021-08-31 MED ORDER — HEPARIN SODIUM (PORCINE) 5000 UNIT/ML IJ SOLN
5000.0000 [IU] | Freq: Three times a day (TID) | INTRAMUSCULAR | Status: DC
  Administered 2021-08-31 – 2021-09-07 (×20): 5000 [IU] via SUBCUTANEOUS
  Filled 2021-08-31 (×22): qty 1

## 2021-08-31 NOTE — ED Provider Notes (Signed)
Assumed care at shift change.  Patient here for generalized weakness.  Labs show dehydration with hypernatremia, AKI.  CT had unremarkable.  Troponin minimally elevated without EKG changes.  Patient is not having any chest pain.  She is receiving IV fluids.  Will admit.  12:15 AM Discussed patient's case with hospitalist, Dr. Posey Pronto.  I have recommended admission and patient (and family if present) agree with this plan. Admitting physician will place admission orders.   I reviewed all nursing notes, vitals, pertinent previous records and reviewed/interpreted all EKGs, lab and urine results, imaging (as available).   Hospitalist is concerned for possible sepsis.  Patient is tachycardic and has a slight leukocytosis.  Lactic is normal.  We will add on procalcitonin.  Blood cultures pending.  We will cath to obtain urinalysis and urine culture.  Will give second liter of IV fluids.  Patient is COVID and flu negative.  Will obtain chest x-ray.  1:00 AM  Pt's procalcitonin is elevated.  Rectal temp normal.  Chest x-ray shows right upper lobe opacity that has improved but still persistent since previous imaging at the beginning of September.  Urine shows moderate leukocytes, 6-10 white blood cells and rare bacteria but also many squamous cells.  Cultures pending.  Will give vancomycin, cefepime for broad coverage.  She is receiving 30 mL/kg IV fluid bolus.   Jaiel Saraceno, Delice Bison, DO 08/31/21 [phone]

## 2021-08-31 NOTE — Progress Notes (Addendum)
Patient admitted from ED to room 111 due to weakness and failure to thrive at home, poor appetite and decline in participation in AD, per family report. Alert, confused and unable to assess orientation as patient refuses to interact.  VSS, RA.  Patient conveyed discomfort when turning and cleaning pressure ulcer sites to Left Groin and Sacrum with normal saline.  Wound Consult Order placed.  Will place Foley to protect wound and promote healing. Niece at bedside.  Call bell and needs within reach. Will continue to monitor and assess with plan of care.

## 2021-08-31 NOTE — ED Notes (Signed)
This Probation officer entered pt's room with pt sitting up in bed with her legs between the bed rails. Pt states that she needs to urinate. This Probation officer reminded pt that she had a purewick and can urinate whenever she wants. Pt placed back in bed and new blankets and non skid socks applied. Pt appreciative.

## 2021-08-31 NOTE — Progress Notes (Signed)
Patient seen and examined personally, I reviewed the chart, history and physical and admission note, done by admitting physician this morning and agree with the same with following addendum.  Please refer to the morning admission note for more detailed plan of care.  Briefly,   Brief narrative: 70 year old female with history of hyperlipidemia hypertension  stage 4 right upper lung adenocarcinoma with innumerable mets to brain  has had whole brain radiation in October, on carboplatin Taxol and Avastin presented with increased weakness, failure to thrive has not been taking her medications for couple of days and having increased fatigue decreased appetite.   She was seen in oncology clinic on 10/26, had completed whole brain XRT, PET from September 14 showed multiple lung mets but no evidence of systemic disease other than patient's known lesion in her brain, oncology proceeded with carboplatinum Taxol and Avastin treatment reduced due to liver dysfunction.  In the ED labs showed hyponatremia, hypokalemia AKI creatinine 1.9 anion gap 20, Pro-Cal 1.2 lactic acid 1.9 COVID-19 present negative, blood cultures sent.  CT head no acute finding, chest x-ray significantly improved aeration of the right upper lobe although persistent infiltrate remained.  Ultrasound 07/27/2021 echogenic liver with hepatic steatosis, S/P cholecystectomy  On exam is alert awake, mildy confused conversant No nausea vomiting or abdomen pain.  Wants to pee- has purewick Appears old and thin frail  A/P FTT w/ gen weakness: In the setting of metastatic cancer on chemotherapy, poor oral intake: RD consult eye fluid hydration supportive care PT OT eval.  Hypernatremia: Change IV fluids due to worsening hyponatremia encourage p.o.  Hypokalemia: At 2.9 this morning, being repleted  AKI: Improved from 1.9-1.0  Dysurea: UA pending, no fever no leukocytosis, hold off antibiotic until then  Transaminitis: Slightly abnormal ALT  AST- 50/119 improving.  Ongoing for some time noted by oncology.  Stage 4 right upper lung adenocarcinoma with innumerable mets to brain  has had whole brain radiation in October, on carboplatin Taxol and Avastin   Patient will benefit with palliative care evaluation.

## 2021-08-31 NOTE — H&P (Signed)
History and Physical    Sandra Jordan IWL:[id number] DOB: 06/28/1951 DOA: 08/30/2021  PCP: Marinda Elk, MD    Patient coming from:  Home    Chief Complaint:  Generalized weakness    HPI:  Sandra Jordan is a 70 y.o. female seen in ed with complaints of generalized weakness, lethargy, and dysuria, brought by family, hypernatremia 149 and new AKI.pt denies any dysuria to me. Says she cant eat or drink, brother who si 70 takes care fo her to the best he can.  ROS is otherwise negative.   Pt has past medical history of lung cancer ,Htn, hyperlipidemia, brain mass, fibrocystic breast disease hyponatremia, hypertension,  ED Course: In ed pt is alert,and cooperative, afebrile and hypotensive.  Vitals:   08/30/21 2230 08/30/21 2330 08/31/21 0015 08/31/21 0035  BP: 116/88 92/78 112/80   Pulse: (!) 111 (!) 108 (!) 114   Resp:  19 19   Temp:    98.4 F (36.9 C)  TempSrc:    Rectal  SpO2: 94% 93% 94%   Weight:      Height:      BP 112/80 and tachycardic.  Blood work shows troponin of 68, sodium 149, potassium 3.4 glucose 104 creatinine of 1.98 with a previous 1 of 0.63.  CPK of 27, CBC shows a white count of 10.6 hemoglobin of 12.4 RDW of 20.9 platelets of 195.  Urinalysis is pending, patient thus far is given 1 L normal saline bolus.  CT head is negative for any acute intracranial abnormality. Initial EKG sinus tachycardia at 126 with significant Q waves in lead V1.  Review of Systems:  Review of Systems  Constitutional:  Positive for malaise/fatigue.  HENT: Negative.    Eyes: Negative.   Respiratory: Negative.    Cardiovascular: Negative.   Gastrointestinal: Negative.   Genitourinary: Negative.   Musculoskeletal: Negative.   Skin: Negative.   Neurological: Negative.   Psychiatric/Behavioral:  The patient is nervous/anxious.   All other systems reviewed and are negative.   Past Medical History:  Diagnosis Date   Allergy    Colon polyp     Hyperlipidemia    Hypertension    Lung cancer [facility])     Past Surgical History:  Procedure Laterality Date   ABDOMINAL HYSTERECTOMY  1984   BREAST BIOPSY Bilateral 1990   fibrocystic breast   BREAST BIOPSY Right August 2014   fibrocystic breast   CHOLECYSTECTOMY  2008   COLONOSCOPY  2012, 2017   [facility]   IR CV LINE INJECTION  07/07/2021   IR IMAGING GUIDED PORT INSERTION  06/20/2021   IR IMAGING GUIDED PORT INSERTION  07/24/2021     reports that she quit smoking about 16 years ago. Her smoking use included cigarettes. She has never used smokeless tobacco. She reports that she does not drink alcohol and does not use drugs.  No Known Allergies  Family History  Problem Relation Age of Onset   Lung cancer Mother    Colon cancer Father 37   Prostate cancer Father    Lung cancer Sister    Lung cancer Maternal Uncle    Stomach cancer Maternal Grandfather    Breast cancer Niece     Prior to Admission medications   Medication Sig Start Date End Date Taking? Authorizing Provider  dexamethasone (DECADRON) 4 MG tablet Take 1 tablet (4 mg total) by mouth daily. 08/21/21  Yes Lloyd Huger, MD  hydrALAZINE (APRESOLINE) 25 MG tablet Take 25 mg by  mouth 2 (two) times daily. 06/07/21  Yes [provider]  hydrochlorothiazide (HYDRODIURIL) 25 MG tablet Take 25 mg by mouth daily. Prn if bp above 150/90   Yes [provider]  lovastatin (MEVACOR) 40 MG tablet SMARTSIG:1 Tablet(s) By Mouth Every Evening 04/14/21  Yes [provider]  mirtazapine (REMERON) 15 MG tablet Take 2 tablets (30 mg total) by mouth at bedtime. 08/23/21  Yes Lloyd Huger, MD  potassium chloride (KLOR-CON) 10 MEQ tablet Take 2 tablets (20 mEq total) by mouth daily. 08/02/21  Yes Lloyd Huger, MD  cetirizine (ZYRTEC) 10 MG tablet Take 10 mg by mouth daily. Patient not taking: Reported on 07/13/2021    [provider]  cyanocobalamin 1000 MCG tablet Take by mouth. Patient  not taking: Reported on 06/27/2021    [provider]  fluticasone (FLONASE) 50 MCG/ACT nasal spray Place into both nostrils. Patient not taking: Reported on 08/09/2021 06/07/21   [provider]  ibuprofen (ADVIL) 800 MG tablet Take 800 mg by mouth every 6 (six) hours as needed. Patient not taking: Reported on 06/27/2021 03/29/21   [provider]  lidocaine-prilocaine (EMLA) cream Apply 1 application topically as needed. 06/26/21   Borders, Kirt Boys, NP  meloxicam (MOBIC) 7.5 MG tablet Take 7.5 mg by mouth daily. Patient not taking: Reported on 06/27/2021 06/12/21   [provider]  ondansetron (ZOFRAN-ODT) 8 MG disintegrating tablet Take 1 tablet (8 mg total) by mouth every 8 (eight) hours as needed for nausea or vomiting. Patient not taking: Reported on 07/26/2021 07/16/21   Jacquelin Hawking, NP  potassium chloride 20 MEQ/15ML (10%) SOLN Take 15 mLs (20 mEq total) by mouth 2 (two) times daily. Patient not taking: Reported on 08/30/2021 06/26/21   Borders, Kirt Boys, NP  prochlorperazine (COMPAZINE) 10 MG tablet Take 1 tablet (10 mg total) by mouth every 6 (six) hours as needed for nausea or vomiting. Patient not taking: Reported on 06/27/2021 06/26/21   Irean Hong, NP    Physical Exam: Vitals:   08/30/21 2230 08/30/21 2330 08/31/21 0015 08/31/21 0035  BP: 116/88 92/78 112/80   Pulse: (!) 111 (!) 108 (!) 114   Resp:  19 19   Temp:    98.4 F (36.9 C)  TempSrc:    Rectal  SpO2: 94% 93% 94%   Weight:      Height:       Physical Exam Vitals and nursing note reviewed.  Constitutional:      General: She is not in acute distress.    Appearance: She is ill-appearing. She is not toxic-appearing or diaphoretic.  HENT:     Head: Normocephalic and atraumatic.     Right Ear: External ear normal.     Left Ear: External ear normal.     Nose: Nose normal.     Mouth/Throat:     Mouth: Mucous membranes are moist.  Eyes:     Pupils: Pupils are equal, round,  and reactive to light.  Neck:     Vascular: No carotid bruit.  Cardiovascular:     Rate and Rhythm: Normal rate and regular rhythm.     Pulses: Normal pulses.     Heart sounds: Normal heart sounds.  Pulmonary:     Effort: Pulmonary effort is normal.     Breath sounds: Normal breath sounds.  Abdominal:     General: Bowel sounds are normal. There is no distension.     Palpations: Abdomen is soft. There is no  mass.     Tenderness: There is no abdominal tenderness. There is no guarding.     Hernia: No hernia is present.  Musculoskeletal:     Right lower leg: No edema.     Left lower leg: No edema.  Skin:    General: Skin is dry.  Neurological:     General: No focal deficit present.     Mental Status: She is alert and oriented to person, place, and time.  Psychiatric:        Mood and Affect: Mood normal.        Behavior: Behavior normal.     Labs on Admission: I have personally reviewed following labs and imaging studies  Recent Labs    08/30/21 2240  CKTOTAL 27*   Lab Results  Component Value Date   WBC 10.6 (H) 08/30/2021   HGB 12.4 08/30/2021   HCT 39.0 08/30/2021   MCV 93.3 08/30/2021   PLT 195 08/30/2021    Recent Labs  Lab 08/30/21 1411  NA 149*  K 3.4*  CL 107  CO2 22  BUN 39*  CREATININE 1.98*  CALCIUM 9.3  GLUCOSE 104*   No results found for: CHOL, HDL, LDLCALC, TRIG No results found for: DDIMER Invalid input(s): POCBNP   COVID-19 Labs No results for input(s): DDIMER, FERRITIN, LDH, CRP in the last 72 hours. Lab Results  Component Value Date   [place] NEGATIVE 08/30/2021   [place] NEGATIVE 06/12/2021    Radiological Exams on Admission: CT Head Wo Contrast  Result Date: 08/30/2021 CLINICAL DATA:  Altered mental status EXAM: CT HEAD WITHOUT CONTRAST TECHNIQUE: Contiguous axial images were obtained from the base of the skull through the vertex without intravenous contrast. COMPARISON:  CT head 06/13/2021 BRAIN: BRAIN Cerebral ventricle  sizes are concordant with the degree of cerebral volume loss. Patchy and confluent areas of decreased attenuation are noted throughout the deep and periventricular white matter of the cerebral hemispheres bilaterally, compatible with chronic microvascular ischemic disease. No evidence of large-territorial acute infarction. No parenchymal hemorrhage. No mass lesion. No extra-axial collection. No mass effect or midline shift. No hydrocephalus. Basilar cisterns are patent. Vascular: No hyperdense vessel. Skull: No acute fracture or focal lesion. Sinuses/Orbits: Paranasal sinuses and mastoid air cells are clear. The orbits are unremarkable. Other: None. IMPRESSION: No acute intracranial abnormality. Electronically Signed   By: Iven Finn M.D.   On: 08/30/2021 23:03   DG Chest Portable 1 View  Result Date: 08/31/2021 CLINICAL DATA:  Weakness and possible sepsis EXAM: PORTABLE CHEST 1 VIEW COMPARISON:  06/12/2021 FINDINGS: Cardiac shadow is within normal limits. Right chest wall port is now seen in satisfactory position. Previously seen consolidation in the right upper lobe has nearly completely resolved although some minimal residual opacity remains. No bony abnormality is noted. IMPRESSION: Significant improved aeration in the right upper lobe although some persistent infiltrate remains. Electronically Signed   By: Inez Catalina M.D.   On: 08/31/2021 00:53    EKG: Independently reviewed.  Sinus tachycardia normal axis 120s.  QTc 448, Q waves in lead V1, ST depression in leads II.  Echocardiogram: None    Assessment/Plan: Patient is a 70 year old female with past medical history of cancer presenting with generalized weakness lethargy and dehydration found to have symptoms of UTI. Principal Problem:   Generalized weakness Active Problems:   AKI (acute kidney injury) ([place])   Dysuria   Hypernatremia   Hypertension   Primary adenocarcinoma of upper lobe of right lung (HCC) Generalized  weakness:  Will admit patient telemetry medical floor with continuous cardiac monitoring and pulse oximetry checks. Aspiration, fall precautions, seizure precautions. Trending up, infection possibly with a suspected UTI. Physical therapy consult.  Acute kidney injury: Lab Results  Component Value Date   CREATININE 1.98 (H) 08/30/2021   CREATININE 0.63 08/23/2021   CREATININE 0.66 08/09/2021  Attribute to dehydration as reflected by hypernatremia and hypotension. Patient has received 1 L of normal saline and is going to receive a second liter.  Dysuria: We will obtain a urinalysis and culture and empirically treat patient with Rocephin.  Hypernatremia: Attribute to decreased p.o. intake secondary to patient's cancer pain. Continue with 1 L additional bolus and maintenance IV fluid at 100 cc/h for the next 24 hours.  Hypertension: Blood pressure 112/80, pulse (!) 114, temperature 98.4 F (36.9 C), temperature source Rectal, resp. rate 19, height 5' 3.5" (1.613 m), weight 56.6 kg, SpO2 94 %. BP is low end will hold all bp meds.   Lung cancer:  Per oncology and may consult as deemed appropriate.     DVT prophylaxis:  Heparin  Code Status:  Full code  Family Communication:  Haith,Darrell ([place])  [phone] (Mobile)   Disposition Plan:  Home  Consults called:  None  Admission status: Inpatient   Para Skeans MD [facility] [phone] How to contact the [facility] Attending or Consulting provider [place] or covering provider during after hours [place], for this patient.    Check the care team in [facility] and look for a) attending/consulting [facility] provider listed and b) the [facility] team listed Log into [email or web address] and use [place]'s universal password to access. If you do not have the password, please contact the hospital operator. Locate the [facility] provider you are looking for under [facility] and page to a number that you can be directly reached. If you still  have difficulty reaching the provider, please page the [facility] (Director on Call) for the Hospitalists listed on amion for assistance. [email or web address] Password [email or web address] 08/31/2021, 1:04 AM

## 2021-08-31 NOTE — Progress Notes (Addendum)
Temp slightly elevated with elevated HR above notification parameters. No change in assessment to mentation. NP B. Randol Kern notified. Attempt to give PRN PO tylenol but patient refused PO intake, rectal tylenol given with decrease in temp. Placed on telemetry per orders with HR 110-120s. Blood pressure rechecks with 90s/60s though patient difficult to keep still and consistently lying on sides. Labs also drawn at this time showing hypernatremia, critically low phosphorous, and low K+; new orders placed by NP and initiated. Continuing with yellow mews protocol, continuing with plan of care.    08/31/21 2033  Assess: MEWS Score  Temp (!) 100.4 F (38 C)  BP (!) 102/42  Pulse Rate (!) 121  Resp 17  SpO2 98 %  Assess: MEWS Score  MEWS Temp 0  MEWS Systolic 0  MEWS Pulse 2  MEWS RR 0  MEWS LOC 0  MEWS Score 2  MEWS Score Color Yellow  Assess: if the MEWS score is Yellow or Red  Were vital signs taken at a resting state? Yes  Focused Assessment No change from prior assessment  Does the patient meet 2 or more of the SIRS criteria? No  MEWS guidelines implemented *See Row Information* Yes  Treat  MEWS Interventions Other (Comment)  Pain Scale Faces  Faces Pain Scale 0  Take Vital Signs  Increase Vital Sign Frequency  Yellow: Q 2hr X 2 then Q 4hr X 2, if remains yellow, continue Q 4hrs  Escalate  MEWS: Escalate Yellow: discuss with charge nurse/RN and consider discussing with provider and RRT  Notify: Charge Nurse/RN  Name of Charge Nurse/RN Notified Laurance Flatten RN  Date Charge Nurse/RN Notified 08/31/21  Time Charge Nurse/RN Notified 2105  Document  Patient Outcome Other (Comment) (continuing with plan of care)  Progress note created (see row info) Yes  Assess: SIRS CRITERIA  SIRS Temperature  0  SIRS Pulse 1  SIRS Respirations  0  SIRS WBC 0  SIRS Score Sum  1

## 2021-08-31 NOTE — Plan of Care (Signed)
  Problem: Health Behavior/Discharge Planning: Goal: Ability to manage health-related needs will improve Outcome: Progressing   Problem: Clinical Measurements: Goal: Ability to maintain clinical measurements within normal limits will improve Outcome: Progressing Goal: Diagnostic test results will improve Outcome: Progressing   Problem: Coping: Goal: Level of anxiety will decrease Outcome: Progressing   Problem: Elimination: Goal: Will not experience complications related to bowel motility Outcome: Progressing Goal: Will not experience complications related to urinary retention Outcome: Progressing   Problem: Safety: Goal: Ability to remain free from injury will improve Outcome: Progressing   Problem: Skin Integrity: Goal: Risk for impaired skin integrity will decrease Outcome: Progressing

## 2021-09-01 DIAGNOSIS — R6521 Severe sepsis with septic shock: Secondary | ICD-10-CM

## 2021-09-01 DIAGNOSIS — Z7189 Other specified counseling: Secondary | ICD-10-CM

## 2021-09-01 DIAGNOSIS — A419 Sepsis, unspecified organism: Principal | ICD-10-CM

## 2021-09-01 DIAGNOSIS — C3411 Malignant neoplasm of upper lobe, right bronchus or lung: Secondary | ICD-10-CM

## 2021-09-01 DIAGNOSIS — Z515 Encounter for palliative care: Secondary | ICD-10-CM

## 2021-09-01 LAB — BASIC METABOLIC PANEL
Anion gap: 5 (ref 5–15)
BUN: 11 mg/dL (ref 8–23)
CO2: 25 mmol/L (ref 22–32)
Calcium: 7.8 mg/dL — ABNORMAL LOW (ref 8.9–10.3)
Chloride: 118 mmol/L — ABNORMAL HIGH (ref 98–111)
Creatinine, Ser: 0.87 mg/dL (ref 0.44–1.00)
GFR, Estimated: >60 mL/min (ref >60)
Glucose, Bld: 111 mg/dL — ABNORMAL HIGH (ref 70–99)
Potassium: 3.6 mmol/L (ref 3.5–5.1)
Sodium: 148 mmol/L — ABNORMAL HIGH (ref 135–145)

## 2021-09-01 LAB — C DIFFICILE QUICK SCREEN W PCR REFLEX
C Diff antigen: NEGATIVE
C Diff interpretation: NOT DETECTED
C Diff toxin: NEGATIVE

## 2021-09-01 LAB — URINE CULTURE: Culture: >=100000 — AB

## 2021-09-01 LAB — SODIUM: Sodium: 151 mmol/L — ABNORMAL HIGH (ref 135–145)

## 2021-09-01 LAB — GASTROINTESTINAL PANEL BY PCR, STOOL (REPLACES STOOL CULTURE)

## 2021-09-01 LAB — HEPATIC FUNCTION PANEL
ALT: 73 U/L — ABNORMAL HIGH (ref 0–44)
AST: 36 U/L (ref 15–41)
Albumin: 2.1 g/dL — ABNORMAL LOW (ref 3.5–5.0)
Alkaline Phosphatase: 192 U/L — ABNORMAL HIGH (ref 38–126)
Bilirubin, Direct: <0.1 mg/dL (ref 0.0–0.2)
Total Bilirubin: 0.7 mg/dL (ref 0.3–1.2)
Total Protein: 4.7 g/dL — ABNORMAL LOW (ref 6.5–8.1)

## 2021-09-01 LAB — HEMOGLOBIN A1C
Hgb A1c MFr Bld: 7.4 % — ABNORMAL HIGH (ref 4.8–5.6)
Mean Plasma Glucose: 166 mg/dL

## 2021-09-01 LAB — CBC
HCT: 26.2 % — ABNORMAL LOW (ref 36.0–46.0)
Hemoglobin: 8.4 g/dL — ABNORMAL LOW (ref 12.0–15.0)
MCH: 30.2 pg (ref 26.0–34.0)
MCHC: 32.1 g/dL (ref 30.0–36.0)
MCV: 94.2 fL (ref 80.0–100.0)
Platelets: 161 10*3/uL (ref 150–400)
RBC: 2.78 MIL/uL — ABNORMAL LOW (ref 3.87–5.11)
RDW: 20.9 % — ABNORMAL HIGH (ref 11.5–15.5)
WBC: 15.2 10*3/uL — ABNORMAL HIGH (ref 4.0–10.5)
nRBC: 2.7 % — ABNORMAL HIGH (ref 0.0–0.2)

## 2021-09-01 LAB — GLUCOSE, CAPILLARY
Glucose-Capillary: 114 mg/dL — ABNORMAL HIGH (ref 70–99)
Glucose-Capillary: 126 mg/dL — ABNORMAL HIGH (ref 70–99)
Glucose-Capillary: 140 mg/dL — ABNORMAL HIGH (ref 70–99)
Glucose-Capillary: 152 mg/dL — ABNORMAL HIGH (ref 70–99)
Glucose-Capillary: 67 mg/dL — ABNORMAL LOW (ref 70–99)
Glucose-Capillary: 80 mg/dL (ref 70–99)
Glucose-Capillary: 91 mg/dL (ref 70–99)
Glucose-Capillary: 98 mg/dL (ref 70–99)

## 2021-09-01 LAB — LACTIC ACID, PLASMA
Lactic Acid, Venous: 2.3 mmol/L (ref 0.5–1.9)
Lactic Acid, Venous: 3.4 mmol/L (ref 0.5–1.9)
Lactic Acid, Venous: 2.8 mmol/L (ref 0.5–1.9)
Lactic Acid, Venous: 2.9 mmol/L (ref 0.5–1.9)

## 2021-09-01 LAB — PHOSPHORUS: Phosphorus: 2.8 mg/dL (ref 2.5–4.6)

## 2021-09-01 LAB — MAGNESIUM: Magnesium: 1.9 mg/dL (ref 1.7–2.4)

## 2021-09-01 LAB — MRSA NEXT GEN BY PCR, NASAL: MRSA by PCR Next Gen: NOT DETECTED

## 2021-09-01 MED ORDER — ADULT MULTIVITAMIN W/MINERALS CH
1.0000 | ORAL_TABLET | Freq: Every day | ORAL | Status: DC
  Administered 2021-09-02 – 2021-09-05 (×4): 1 via ORAL
  Filled 2021-09-01 (×5): qty 1

## 2021-09-01 MED ORDER — POTASSIUM CHLORIDE 10 MEQ/100ML IV SOLN
10.0000 meq | INTRAVENOUS | Status: AC
  Filled 2021-09-01: qty 100

## 2021-09-01 MED ORDER — POTASSIUM CHLORIDE 10 MEQ/100ML IV SOLN
10.0000 meq | INTRAVENOUS | Status: AC
  Administered 2021-09-01 (×2): 10 meq via INTRAVENOUS

## 2021-09-01 MED ORDER — VANCOMYCIN HCL 1000 MG/200ML IV SOLN
1000.0000 mg | INTRAVENOUS | Status: DC
  Administered 2021-09-02: 1000 mg via INTRAVENOUS
  Filled 2021-09-01: qty 200

## 2021-09-01 MED ORDER — COLLAGENASE 250 UNIT/GM EX OINT
TOPICAL_OINTMENT | Freq: Every day | CUTANEOUS | Status: DC
  Administered 2021-09-03: 1 via TOPICAL
  Filled 2021-09-01 (×4): qty 30

## 2021-09-01 MED ORDER — SODIUM CHLORIDE 0.9 % IV BOLUS
1000.0000 mL | Freq: Once | INTRAVENOUS | Status: AC
  Administered 2021-09-01: 09:00:00 1000 mL via INTRAVENOUS

## 2021-09-01 MED ORDER — LACTATED RINGERS IV SOLN: INTRAVENOUS | Status: DC

## 2021-09-01 MED ORDER — MIDODRINE HCL 5 MG PO TABS
2.5000 mg | ORAL_TABLET | Freq: Three times a day (TID) | ORAL | Status: DC
  Administered 2021-09-02 – 2021-09-05 (×10): 2.5 mg via ORAL
  Filled 2021-09-01 (×12): qty 1

## 2021-09-01 MED ORDER — DEXTROSE 50 % IV SOLN: 25.0000 mL | Freq: Once | INTRAVENOUS | Status: AC

## 2021-09-01 MED ORDER — INSULIN ASPART 100 UNIT/ML IJ SOLN: 0.0000 [IU] | INTRAMUSCULAR | Status: DC

## 2021-09-01 MED ORDER — ORAL CARE MOUTH RINSE
15.0000 mL | Freq: Two times a day (BID) | OROMUCOSAL | Status: DC
  Administered 2021-09-01 – 2021-09-05 (×7): 15 mL via OROMUCOSAL

## 2021-09-01 MED ORDER — SODIUM CHLORIDE 0.9 % IV BOLUS
250.0000 mL | Freq: Once | INTRAVENOUS | Status: AC
  Administered 2021-09-01: 14:00:00 250 mL via INTRAVENOUS

## 2021-09-01 MED ORDER — DEXTROSE 50 % IV SOLN
INTRAVENOUS | Status: AC
  Administered 2021-09-01: 25 mL
  Filled 2021-09-01: qty 50

## 2021-09-01 MED ORDER — ENSURE ENLIVE PO LIQD
237.0000 mL | Freq: Three times a day (TID) | ORAL | Status: DC
  Administered 2021-09-02 – 2021-09-07 (×4): 237 mL via ORAL

## 2021-09-01 MED ORDER — INSULIN ASPART 100 UNIT/ML IJ SOLN
0.0000 [IU] | INTRAMUSCULAR | Status: DC
  Administered 2021-09-03 – 2021-09-08 (×4): 1 [IU] via SUBCUTANEOUS
  Filled 2021-09-01 (×5): qty 1

## 2021-09-01 MED ORDER — PIPERACILLIN-TAZOBACTAM 3.375 G IVPB
3.3750 g | Freq: Three times a day (TID) | INTRAVENOUS | Status: DC
  Administered 2021-09-01 – 2021-09-02 (×4): 3.375 g via INTRAVENOUS
  Filled 2021-09-01 (×4): qty 50

## 2021-09-01 MED ORDER — ALBUMIN HUMAN 25 % IV SOLN
25.0000 g | Freq: Four times a day (QID) | INTRAVENOUS | Status: DC
  Administered 2021-09-01 – 2021-09-02 (×3): 25 g via INTRAVENOUS
  Filled 2021-09-01 (×3): qty 100

## 2021-09-01 MED ORDER — MIDODRINE HCL 5 MG PO TABS: 10.0000 mg | ORAL_TABLET | Freq: Three times a day (TID) | ORAL | Status: DC

## 2021-09-01 MED ORDER — VANCOMYCIN HCL 1250 MG/250ML IV SOLN
1250.0000 mg | Freq: Once | INTRAVENOUS | Status: AC
  Administered 2021-09-01: 1250 mg via INTRAVENOUS
  Filled 2021-09-01: qty 250

## 2021-09-01 MED ORDER — SODIUM CHLORIDE 0.9 % IV BOLUS
750.0000 mL | Freq: Once | INTRAVENOUS | Status: AC
  Administered 2021-09-01: 12:00:00 750 mL via INTRAVENOUS

## 2021-09-01 MED ORDER — POTASSIUM CHLORIDE 10 MEQ/100ML IV SOLN
10.0000 meq | Freq: Once | INTRAVENOUS | Status: AC
  Administered 2021-09-01: 10 meq via INTRAVENOUS
  Filled 2021-09-01 (×2): qty 100

## 2021-09-01 NOTE — Consult Note (Signed)
WOC Nurse Consult Note: Reason for Consult: Sacral and thigh Wound type: Unstageable Pressure Injury; 3cm x 6cm x 0.2cm; 10% necrotic/90% pink Full thickness wound right groin Pressure Injury POA: Yes Measurement:see above  Wound bed: see above  Drainage (amount, consistency, odor) scant to none Periwound: intact  Dressing procedure/placement/frequency: Add enzymatic debridement ointment to the sacrum Add air mattress for moisture management and pressure redistribution Add chair pressure redistribution pad for when sitting up; send home with patient Silicone foam to the groin; change every 3 days    Discussed POC with patient and bedside nurse.  Re consult if needed, will not follow at this time. Thanks  Maylene Crocker [employer], RN,CWOCN, CNS, [place] [phone])

## 2021-09-01 NOTE — Consult Note (Signed)
CRITICAL CARE PROGRESS NOTE    Name: Sandra Jordan MRN: [id number] DOB: 06-28-1951     LOS: 1   SUBJECTIVE FINDINGS & SIGNIFICANT EVENTS    Patient description:   This is a 70 yo F with hx of stage 4 lung cancer with brain metastasis, failure to thrive, found home covered in feces.    I met with family and had goals of care conference, nephew and niece are present and share patient is currently full code. She resides her brother who has psychiatric diagnosis on therapy and patient cares for him medically due to his psychiatric illness. They have met with palliative already and have additional meetings planned at some point.   Additional Data from admission report including:   labs showed hyponatremia, hypokalemia AKI creatinine 1.9 anion gap 20, Pro-Cal 1.2 lactic acid 1.9. COVID-19 present negative, urine/blood cultures sent.  CT head no acute finding, chest x-ray significantly improved aeration of the right upper lobe although persistent infiltrate remained.  Ultrasound 07/27/2021 echogenic liver with hepatic steatosis, S/P cholecystectomy Patient received vancomycin and cefepime 11/24 AM in ED.  Lines/tubes : Implanted Port 07/24/21 Right Chest (Active)  Site Assessment Clean;Dry;Intact 08/31/21 2100  Port Intervention Flushed;Deaccessed - Gauze Dressing Placed 08/23/21 1504  Needle Size H 20 gauge 08/31/21 1630  Line Status Infusing 08/31/21 2100  Line Care Tubing changed;Connections checked and tightened 08/31/21 2100  Dressing Type Transparent 08/31/21 2100  Dressing Status Clean;Dry;Intact 08/31/21 2100  Antimicrobial disc in place? Yes 08/31/21 2100  Dressing Intervention New dressing 08/23/21 0824  Needle Change Due 09/06/21 08/31/21 2100     Urethral Catheter Jun, RN Non-latex 14 Fr.  (Active)  Indication for Insertion or Continuance of Catheter Healing of stage 3 or 4 pressure injuries AND incontinence 09/01/21 1027  Site Assessment Clean;Intact;Dry 09/01/21 1027  Catheter Maintenance Bag below level of bladder;Catheter secured;Drainage bag/tubing not touching floor;Insertion date on drainage bag;No dependent loops;Seal intact;Bag emptied prior to transport 09/01/21 1027  Collection Container Standard drainage bag 09/01/21 1027  Securement Method Securing device (Describe) 09/01/21 1027  Output (mL) 380 mL 09/01/21 0338    Microbiology/Sepsis markers: Results for orders placed or performed during the hospital encounter of 08/30/21  Resp Panel by RT-PCR (Flu A&B, Covid) Nasopharyngeal Swab     Status: None   Collection Time: 08/30/21 10:40 PM   Specimen: Nasopharyngeal Swab; Nasopharyngeal(NP) swabs in vial transport medium  Result Value Ref Range Status   SARS Coronavirus 2 by RT PCR NEGATIVE NEGATIVE Final    Comment: (NOTE) SARS-CoV-2 target nucleic acids are NOT DETECTED.  The SARS-CoV-2 RNA is generally detectable in upper respiratory specimens during the acute phase of infection. The lowest concentration of SARS-CoV-2 viral copies this assay can detect is 138 copies/mL. A negative result does not preclude SARS-Cov-2 infection and should not be used as the sole basis for treatment or other patient management decisions. A negative result may occur with  improper specimen collection/handling, submission of specimen other than nasopharyngeal swab, presence of viral mutation(s) within the areas targeted by this assay, and inadequate number of viral copies(<138 copies/mL). A negative result must be combined with clinical observations, patient history, and epidemiological information. The expected result is Negative.  Fact Sheet for Patients:  EntrepreneurPulse.com.au  Fact Sheet for Healthcare Providers:   IncredibleEmployment.be  This test is no t yet approved or cleared by the [place] FDA and  has been authorized for detection and/or diagnosis of SARS-CoV-2 by FDA under an Emergency Use Authorization (EUA).  This EUA will remain  in effect (meaning this test can be used) for the duration of the COVID-19 declaration under Section 564(b)(1) of the Act, 21 U.S.C.section 360bbb-3(b)(1), unless the authorization is terminated  or revoked sooner.       Influenza A by PCR NEGATIVE NEGATIVE Final   Influenza B by PCR NEGATIVE NEGATIVE Final    Comment: (NOTE) The Xpert Xpress SARS-CoV-2/FLU/RSV plus assay is intended as an aid in the diagnosis of influenza from Nasopharyngeal swab specimens and should not be used as a sole basis for treatment. Nasal washings and aspirates are unacceptable for Xpert Xpress SARS-CoV-2/FLU/RSV testing.  Fact Sheet for Patients: EntrepreneurPulse.com.au  Fact Sheet for Healthcare Providers: IncredibleEmployment.be  This test is not yet approved or cleared by the [place] FDA and has been authorized for detection and/or diagnosis of SARS-CoV-2 by FDA under an Emergency Use Authorization (EUA). This EUA will remain in effect (meaning this test can be used) for the duration of the COVID-19 declaration under Section 564(b)(1) of the Act, 21 U.S.C. section 360bbb-3(b)(1), unless the authorization is terminated or revoked.  Performed at [facility], [address] [place], [place] [address]   Blood culture (routine x 2)     Status: None (Preliminary result)   Collection Time: 08/30/21 10:40 PM   Specimen: BLOOD  Result Value Ref Range Status   Specimen Description BLOOD RCHEST PORK  Final   Special Requests   Final    BOTTLES DRAWN AEROBIC AND ANAEROBIC Blood Culture adequate volume   Culture   Final    NO GROWTH 2 DAYS Performed at [facility], [address]  [place], [place] [address]    Report Status PENDING  Incomplete  Urine Culture     Status: Abnormal   Collection Time: 08/31/21 12:32 AM   Specimen: Urine, Catheterized  Result Value Ref Range Status   Specimen Description   Final    URINE, CATHETERIZED Performed at [facility], [address] [place], [place] [address]    Special Requests   Final    NONE Performed at [facility], [address] [place], [place] [address]    Culture (A)  Final    >=100,000 COLONIES/mL VIRIDANS STREPTOCOCCUS Standardized susceptibility testing for this organism is not available. Performed at [facility], [place], [place], [place] [address]    Report Status 09/01/2021 FINAL  Final    Anti-infectives:  Anti-infectives (From admission, onward)    Start     Dose/Rate Route Frequency Ordered Stop   09/01/21 1030  vancomycin (VANCOREADY) IVPB 1250 mg/250 mL        1,250 mg 166.7 mL/hr over 90 Minutes Intravenous  Once 09/01/21 0931 09/01/21 1208   09/01/21 1000  piperacillin-tazobactam (ZOSYN) IVPB 3.375 g        3.375 g 12.5 mL/hr over 240 Minutes Intravenous Every 8 hours 09/01/21 0931     08/31/21 0100  vancomycin (VANCOCIN) IVPB 1000 mg/200 mL premix        1,000 mg 200 mL/hr over 60 Minutes Intravenous  Once 08/31/21 0056 08/31/21 0409   08/31/21 0100  ceFEPIme (MAXIPIME) 2 g in sodium chloride 0.9 % 100 mL IVPB        2 g 200 mL/hr over 30 Minutes Intravenous  Once 08/31/21 0056 08/31/21 0231        PAST MEDICAL HISTORY   Past Medical History:  Diagnosis Date   Allergy    Colon polyp  Hyperlipidemia    Hypertension    Lung cancer [facility])      SURGICAL HISTORY   Past Surgical History:  Procedure Laterality Date   ABDOMINAL HYSTERECTOMY  1984   BREAST BIOPSY Bilateral 1990   fibrocystic breast   BREAST BIOPSY Right August 2014   fibrocystic breast   CHOLECYSTECTOMY  2008   COLONOSCOPY  2012, 2017   [facility]   IR CV LINE INJECTION   07/07/2021   IR IMAGING GUIDED PORT INSERTION  06/20/2021   IR IMAGING GUIDED PORT INSERTION  07/24/2021     FAMILY HISTORY   Family History  Problem Relation Age of Onset   Lung cancer Mother    Colon cancer Father 12   Prostate cancer Father    Lung cancer Sister    Lung cancer Maternal Uncle    Stomach cancer Maternal Grandfather    Breast cancer Niece      SOCIAL HISTORY   Social History   Tobacco Use   Smoking status: Former    Years: 15.00    Types: Cigarettes    Quit date: 10/08/2004    Years since quitting: 16.9   Smokeless tobacco: Never  Substance Use Topics   Alcohol use: No    Alcohol/week: 0.0 standard drinks   Drug use: No     MEDICATIONS   Current Medication:  Current Facility-Administered Medications:    acetaminophen (TYLENOL) tablet 650 mg, 650 mg, Oral, Q6H PRN **OR** acetaminophen (TYLENOL) suppository 650 mg, 650 mg, Rectal, Q6H PRN, Para Skeans, MD, 650 mg at 08/31/21 2130   Chlorhexidine Gluconate Cloth 2 % PADS 6 each, 6 each, Topical, Daily, Para Skeans, MD, 6 each at 09/01/21 1158   collagenase (SANTYL) ointment, , Topical, Daily, Kc, Ramesh, MD   dextrose 5 % solution, , Intravenous, Continuous, Sharion Settler, NP, Last Rate: 125 mL/hr at 08/31/21 2327, New Bag at 08/31/21 2327   heparin injection 5,000 Units, 5,000 Units, Subcutaneous, Q8H, Florina Ou V, MD, 5,000 Units at 09/01/21 0544   insulin aspart (novoLOG) injection 0-15 Units, 0-15 Units, Subcutaneous, Q4H, Sharion Settler, NP   midodrine (PROAMATINE) tablet 10 mg, 10 mg, Oral, TID WC, Kc, Ramesh, MD   piperacillin-tazobactam (ZOSYN) IVPB 3.375 g, 3.375 g, Intravenous, Q8H, Darnelle Bos, RPH, Last Rate: 12.5 mL/hr at 09/01/21 1131, 3.375 g at 09/01/21 1131  Facility-Administered Medications Ordered in Other Encounters:    heparin lock flush 100 unit/mL, 500 Units, Intravenous, Once, Borders, Kirt Boys, NP    ALLERGIES   Patient has no known allergies.    REVIEW  OF SYSTEMS     Unable to obtain due to poorly responsive status  PHYSICAL EXAMINATION   Vital Signs: Temp:  [98.4 F (36.9 C)-100.6 F (38.1 C)] 100.5 F (38.1 C) (11/25 1423) Pulse Rate:  [101-121] 117 (11/25 1423) Resp:  [15-19] 19 (11/25 1423) BP: (81-136)/(42-80) 107/80 (11/25 1423) SpO2:  [94 %-100 %] 100 % (11/25 1423) Weight:  [56.7 kg] 56.7 kg (11/25 0230)  GENERAL:Age appropriate chronically ill appearing HEAD: Normocephalic, atraumatic.  EYES: Pupils equal, round, reactive to light.  No scleral icterus.  MOUTH: Moist mucosal membrane. NECK: Supple. No thyromegaly. No nodules. No JVD.  PULMONARY: mild rhonchi b/l CARDIOVASCULAR: S1 and S2. Regular rate and rhythm. No murmurs, rubs, or gallops.  GASTROINTESTINAL: Soft, nontender, non-distended. No masses. Positive bowel sounds. No hepatosplenomegaly.  MUSCULOSKELETAL: No swelling, clubbing, or edema.  NEUROLOGIC: GCS10 SKIN:intact,warm,dry   PERTINENT DATA     Infusions:  dextrose  125 mL/hr at 08/31/21 2327   piperacillin-tazobactam (ZOSYN)  IV 3.375 g (09/01/21 1131)   Scheduled Medications:  Chlorhexidine Gluconate Cloth  6 each Topical Daily   collagenase   Topical Daily   heparin  5,000 Units Subcutaneous Q8H   insulin aspart  0-15 Units Subcutaneous Q4H   midodrine  10 mg Oral TID WC   PRN Medications: acetaminophen **OR** acetaminophen Hemodynamic parameters:   Intake/Output: 11/24 0701 - 11/25 0700 In: 0272 [I.V.:2012.6; IV Piggyback:1314.4] Out: 2490 [Urine:2490]  Ventilator  Settings:    LAB RESULTS:  Basic Metabolic Panel: Recent Labs  Lab 08/30/21 1411 08/31/21 0517 08/31/21 2200 09/01/21 0331 09/01/21 0833  NA 149* 151* 153* 151* 148*  K 3.4* 2.9* 3.0*  --  3.6  CL 107 114* 121*  --  118*  CO2 _0 --  25  GLUCOSE 104* 123* 146*  --  111*  BUN 39* 31* 19  --  11  CREATININE 1.98* 1.01* 0.86  --  0.87  CALCIUM 9.3 8.6* 7.9*  --  7.8*  MG  --   --   --  1.9  --   PHOS   --   --  <1.0*  --  2.8   Liver Function Tests: Recent Labs  Lab 08/31/21 0032 08/31/21 0517 08/31/21 2200 09/01/21 1126  AST 63* 50*  --  36  ALT 135* 119*  --  73*  ALKPHOS 293* 267*  --  192*  BILITOT 1.1 1.0  --  0.7  PROT 6.2* 5.9*  --  4.7*  ALBUMIN 3.0* 2.8* 2.4* 2.1*   No results for input(s): LIPASE, AMYLASE in the last 168 hours. No results for input(s): AMMONIA in the last 168 hours. CBC: Recent Labs  Lab 08/30/21 2240 08/31/21 0517 09/01/21 0331  WBC 10.6* 10.6* 15.2*  HGB 12.4 10.6* 8.4*  HCT 39.0 34.2* 26.2*  MCV 93.3 94.7 94.2  PLT 195 170 161   Cardiac Enzymes: Recent Labs  Lab 08/30/21 2240  CKTOTAL 27*   BNP: Invalid input(s): POCBNP CBG: Recent Labs  Lab 09/01/21 0007 09/01/21 0336 09/01/21 0808 09/01/21 1311 09/01/21 1502  GLUCAP 126* 152* 91 98 114*       IMAGING RESULTS:  Imaging: CT Head Wo Contrast  Result Date: 08/30/2021 CLINICAL DATA:  Altered mental status EXAM: CT HEAD WITHOUT CONTRAST TECHNIQUE: Contiguous axial images were obtained from the base of the skull through the vertex without intravenous contrast. COMPARISON:  CT head 06/13/2021 BRAIN: BRAIN Cerebral ventricle sizes are concordant with the degree of cerebral volume loss. Patchy and confluent areas of decreased attenuation are noted throughout the deep and periventricular white matter of the cerebral hemispheres bilaterally, compatible with chronic microvascular ischemic disease. No evidence of large-territorial acute infarction. No parenchymal hemorrhage. No mass lesion. No extra-axial collection. No mass effect or midline shift. No hydrocephalus. Basilar cisterns are patent. Vascular: No hyperdense vessel. Skull: No acute fracture or focal lesion. Sinuses/Orbits: Paranasal sinuses and mastoid air cells are clear. The orbits are unremarkable. Other: None. IMPRESSION: No acute intracranial abnormality. Electronically Signed   By: Iven Finn M.D.   On: 08/30/2021  23:03   DG Chest Portable 1 View  Result Date: 08/31/2021 CLINICAL DATA:  Weakness and possible sepsis EXAM: PORTABLE CHEST 1 VIEW COMPARISON:  06/12/2021 FINDINGS: Cardiac shadow is within normal limits. Right chest wall port is now seen in satisfactory position. Previously seen consolidation in the right upper lobe has nearly completely resolved although some minimal residual  opacity remains. No bony abnormality is noted. IMPRESSION: Significant improved aeration in the right upper lobe although some persistent infiltrate remains. Electronically Signed   By: Inez Catalina M.D.   On: 08/31/2021 00:53   _0 @ No results found.             IMPRESSION: 1. Dominant right upper lobe mass with truncation of the right upper lobe bronchus, with extensive associated thoracic adenopathy as well as intracranial metastatic lesions. 2. A 2.3 by 1.9 cm somewhat irregular right lower lobe nodule could represent a metastatic lesion or synchronous low-grade adenocarcinoma, but has only low-grade activity with maximum SUV of 1.4. The other tiny pulmonary nodules scattered in the right lung are too small to characterize. 3. No compelling findings of metastatic involvement of the abdomen/pelvis or visualized skeleton. 4. Other imaging findings of potential clinical significance: Aortic Atherosclerosis (ICD10-I70.0). Nonobstructive left nephrolithiasis. Scattered small type 1 hiatal hernia.   ASSESSMENT AND PLAN    -Multidisciplinary rounds held today  Acute Hypoxic Respiratory Failure -patient requiring 3L/min  -post obstructive RUL Adenocarcinoma tumor with right mainstem lesion mass effect as noted in CT above -continue Bronchodilator Therapy   Goals of care conference    Code status :DNR /DNI   Septic shock -present on admission -urinary tract infection          Strep viridans     On antibiotics-vanc/zosyn empirically -use vasopressors to keep MAP>65 -follow ABG and  LA -follow up cultures -emperic ABX    Altered Mental status with encephalopathy - suspect due to septic encephalopathy and due to progressive metastatic brain cancer   ID -continue IV abx as prescibed -follow up cultures  GI/Nutrition GI PROPHYLAXIS as indicated DIET-->TF's as tolerated Constipation protocol as indicated  ENDO - ICU hypoglycemic\Hyperglycemia protocol -check FSBS per protocol   ELECTROLYTES -follow labs as needed -replace as needed -pharmacy consultation   DVT/GI PRX ordered -SCDs  TRANSFUSIONS AS NEEDED MONITOR FSBS ASSESS the need for LABS as needed   Critical care provider statement:   Total critical care time: 109 minutes   Performed by: Lanney Gins MD   Critical care time was exclusive of separately billable procedures and treating other patients.   Critical care was necessary to treat or prevent imminent or life-threatening deterioration.   Critical care was time spent personally by me on the following activities: development of treatment plan with patient and/or surrogate as well as nursing, discussions with consultants, evaluation of patient's response to treatment, examination of patient, obtaining history from patient or surrogate, ordering and performing treatments and interventions, ordering and review of laboratory studies, ordering and review of radiographic studies, pulse oximetry and re-evaluation of patient's condition.    Ottie Glazier, M.D.  Pulmonary & Critical Care Medicine       Ottie Glazier, M.D.  Division of [place]

## 2021-09-01 NOTE — Progress Notes (Signed)
Initial Nutrition Assessment  DOCUMENTATION CODES:   Not applicable  INTERVENTION:   -Ensure Enlive po TID, each supplement provides 350 kcal and 20 grams of protein  -Magic cup TID with meals, each supplement provides 290 kcal and 9 grams of protein  -MVI with minerals daily  NUTRITION DIAGNOSIS:   Increased nutrient needs related to wound healing as evidenced by estimated needs.  GOAL:   Patient will meet greater than or equal to 90% of their needs  MONITOR:   PO intake, Supplement acceptance, Diet advancement, Labs, Weight trends, Skin, I & O's  REASON FOR ASSESSMENT:   Malnutrition Screening Tool    ASSESSMENT:   Sandra Jordan is a 70 y.o. female seen in ed with complaints of generalized weakness, lethargy, and dysuria, brought by family, hypernatremia 149 and new AKI.pt denies any dysuria to me. Says she cant eat or drink, brother who si 70 takes care fo her to the best he can.  Pt admitted with generalized weakness and possible UTI.   Reviewed I/O's: +837 ml x 24 hours and +3.1 L since admission    UOP: 2.5 L x 24 hours  Pt out of room at time of visit.   Per wound care notes, pt with full thickness wound to rt groin and unstageable to sacrum.   Pt out of room at time of visit. RD unable to obtain further nutrition-related history or complete nutrition-focused physical exam at this time.    Per RN, plan to transfer to pt ICU due to septic shock.   Reviewed wt hx; wt has been stable over the past 2 months.   Labs reviewed: CBGS: 91-152 (inpatient orders for glycemic control are 0-15 units insulin aspart every 4 hours).    Diet Order:   Diet Order             DIET - DYS 1 Room service appropriate? Yes; Fluid consistency: Thin  Diet effective now                   EDUCATION NEEDS:   No education needs have been identified at this time  Skin:  Skin Assessment: Skin Integrity Issues: Skin Integrity Issues:: Unstageable, Other  (Comment) Unstageable: sacrum Other: full thickness wound to rt groin  Last BM:  09/01/21  Height:   Ht Readings from Last 1 Encounters:  08/30/21 5' 3.5" (1.613 m)    Weight:   Wt Readings from Last 1 Encounters:  09/01/21 56.7 kg    Ideal Body Weight:  53.4 kg  BMI:  Body mass index is 21.8 kg/m.  Estimated Nutritional Needs:   Kcal:  1800-2000  Protein:  100-115 grams  Fluid:  > 1.8 L    Loistine Chance, RD, LDN, [place] Registered Dietitian II Certified Diabetes Care and Education Specialist Please refer to [facility] for RD and/or RD on-call/weekend/after hours pager

## 2021-09-01 NOTE — Consult Note (Signed)
Pharmacy Antibiotic Note  Sandra Jordan is a 70 y.o. female admitted on 08/30/2021 with lethargy, weakness and dysuria. Pharmacy has been consulted for vancomycin and Zosyn dosing for suspected sepsis.  Plan: Vancomycin 1250 mg IV loading dose, followed by 1000 mg IV q24h  Goal AUC 400-550  Est AUC: 529 Est Cmax: 36.5 Est Cmin: 12.6 Calculated with SCr 0.87  Zosyn 3.375 gm IV q8h (4 hour infusion)  Monitor clinical picture, renal function, and vancomycin levels at steady state F/U C&S, abx deescalation / LOT   Height: 5' 3.5" (161.3 cm) Weight: 56.7 kg (125 lb) IBW/kg (Calculated) : 53.55  Temp (24hrs), Avg:99 F (37.2 C), Min:97.7 F (36.5 C), Max:100.4 F (38 C)  Recent Labs  Lab 08/30/21 1411 08/30/21 2240 08/31/21 0517 08/31/21 2200 09/01/21 0331 09/01/21 0833  WBC  --  10.6* 10.6*  --  15.2*  --   CREATININE 1.98*  --  1.01* 0.86  --  0.87  LATICACIDVEN  --  1.9  --   --   --   --     Estimated Creatinine Clearance: 51.6 mL/min (by C-G formula based on SCr of 0.87 mg/dL).    No Known Allergies  Antimicrobials this admission: 11/24 one time doses of vanc and cefepime given in the ED  11/25 vancomycin  >>  11/25 Zosyn >>   Dose adjustments this admission: N/A  Microbiology results: 11/23 BCx: NGTD 11/24 UCx: Pending    Thank you for allowing pharmacy to be a part of this patient's care.  Darnelle Bos, PharmD 09/01/2021 9:15 AM

## 2021-09-01 NOTE — Consult Note (Signed)
Palliative Care Consult Note                                  Date: 09/01/2021   Patient Name: Sandra Jordan  DOB: 27-Sep-1951  MRN: [id number]  Age / Sex: 70 y.o., female  PCP: Marinda Elk, MD Referring Physician: Antonieta Pert, MD  Reason for Consultation: Establishing goals of care  HPI/Patient Profile: 70 y.o. female  with past medical history of hyperlipidemia hypertension  stage 4 right upper lung adenocarcinoma with innumerable mets to brain  has had whole brain radiation in October, on carboplatin Taxol and Avastin presented with increased weakness, failure to thrive has not been taking her medications for couple of days and having increased fatigue decreased appetite, and when family checked her she was found lying on the bed on the feces.  She lives with her brother.  Then nephew brought the patient to the hospital. She was admitted on 08/30/2021 with failure to thrive, generalized weakness, hypertension with suspected sepsis, metabolic encephalopathy, stage IV right upper lung adenocarcinoma with innumerable mets to brain, and others.   Today she became hypotensive and suspected septic shock UTI.  She was transferred to the ICU.  PMT was consulted for goals of care.  She follows with Billey Chang, NP at the cancer center and sees outpatient of care with [facility].  Past Medical History:  Diagnosis Date   Allergy    Colon polyp    Hyperlipidemia    Hypertension    Lung cancer ([place])     Subjective:   This NP Walden Field reviewed medical records, received report from team, assessed the patient and then meet at the patient's bedside to discuss diagnosis, prognosis, GOC, EOL wishes disposition and options.  I met with I met with the patient, her niece, and her nephew at the bedside.  The patient was not very interactive and the majority of the discussion was with the niece and nephew.   Concept of  Palliative Care was introduced as specialized medical care for people and their families living with serious illness.  If focuses on providing relief from the symptoms and stress of a serious illness.  The goal is to improve quality of life for both the patient and the family. Values and goals of care important to patient and family were attempted to be elicited.  Created space and opportunity for patient  and family to explore thoughts and feelings regarding current medical situation   Natural trajectory and current clinical status were discussed. Questions and concerns addressed. Patient  encouraged to call with questions or concerns.    Patient/Family Understanding of Illness: The patient's niece and nephew understand she has stage IV cancer with spread to the brain.  If they feel in general that she developed a level of comfort care.  However, they remain hopeful for some improvement.  They have noted a decline recently which she described as fast.  At baseline she was able to walk with a [place] but then recently quickly stopped walking, eating, taking meds.  They state that she has been caring for her brother who has mental illness.  Palliative care has been seeing her but they feel the patient does not grasp her situation.  She feels "if I just finished my treatments all the normal again."  Goals: For the last outpatient palliative care note wants to maximize quality of life and symptom management and  avoid possible addition.  Her niece and nephew at this point are worried about progressive care with unlikely chance of benefit.  Today's Discussion: Today we discussed in detail her current clinical status.  Pulmonary critical care was present and appreciate their input.  Explained that her lung mass is now so big that it is compressing her trachea and minimal air movement is occurring.  We reviewed "innumerable brain metastasis" and the possibility that her sudden change in mental status could be  progression of her cancer and specifically her brain metastasis.  We discussed the concept of multiple acute illnesses on top of multiple chronic illnesses and eventually "the Reita Cliche becomes too much to carry."  He seems understand she has had quite a serious situation.  They are holding out hope for improvement but seems to be realistic on chances for significant recovery.  We discussed advance care planning including CODE STATUS.  Expressed desire for DNR given unlikely benefit to aggressive resuscitation in light of substantial harm that would likely be caused.  Otherwise, would like time for outcomes, possibly 24 to 48 hours, before making further decisions.  I feel this is reasonable.  I provided general and emotional support through therapeutic listening, empathy, shared stories, and other techniques.  I answered all the questions and addressed all concerns.  Provided a copy of "hard choices for loving people" to assist in decision-making as clinical situation evolves.  Review of Systems  Unable to perform ROS: Acuity of condition   Objective:   Primary Diagnoses: Present on Admission:  Hypertension  Primary adenocarcinoma of upper lobe of right lung ([place])  Hypernatremia  AKI (acute kidney injury) ([place])  Dysuria   Physical Exam Vitals and nursing note reviewed.  Constitutional:      General: She is sleeping. She is not in acute distress. HENT:     Head: Normocephalic and atraumatic.  Cardiovascular:     Rate and Rhythm: Tachycardia present.  Pulmonary:     Effort: Pulmonary effort is normal. No respiratory distress.  Abdominal:     General: Abdomen is flat.     Palpations: Abdomen is soft.  Skin:    General: Skin is warm and dry.    Vital Signs:  BP 107/80 (BP Location: Right Arm)   Pulse (!) 117   Temp (!) 100.5 F (38.1 C) (Oral)   Resp 19   Ht 5' 3.5" (1.613 m)   Wt 56.7 kg   SpO2 100%   BMI 21.80 kg/m   Palliative Assessment/Data: 30%    Advanced Care  Planning:   Primary Decision Maker: NEXT OF KIN Noted outpatient palliative care with patient expressed her desire for her niece and nephew to be POA, even in the absence of official notarized paperwork.  Other surviving relative is a brother with significant mental illness.  Code Status/Advance Care Planning: DNR  A discussion was had today regarding advanced directives. Concepts specific to code status, artifical feeding and hydration, continued IV antibiotics and rehospitalization was had.  The difference between a aggressive medical intervention path and a palliative comfort care path for this patient at this time was had.   Decisions/Changes to ACP: Change to DNR  Assessment & Plan:   Impression: 70 year old female with extensive stage IV lung cancer with innumerable mets to brain presents with worsening mental status, sepsis, failure to thrive.  She is off of the medications or eating much in the past several days.  This could represent progression of her brain metastasis.  Undergoing chemotherapy  and whole brain radiation for treatment, has since developed a UTI with sepsis and septic shock.  She was transitioned to the ICU.  Overall long-term prognosis is poor.  SUMMARY OF RECOMMENDATIONS   Changed to DNR Treat the treatable Allow time for outcomes Further discussions on goals of care as clinical situation evolves PMT will continue to follow-up  Symptom Management:  Per primary team PMT is available to assist as needed  Prognosis:  Unable to determine  Discharge Planning:  To Be Determined   Discussed with: Medical team, nursing team, patient's niece, patient's nephew   Thank you for allowing [place] to participate in the care of Yaeko Fazekas PMT will continue to support holistically.  Time Total: 70 min  Greater than 50%  of this time was spent counseling and coordinating care related to the above assessment and plan.  Signed by: Walden Field, NP Palliative  Medicine Team  Team Phone # [phone] (Nights/Weekends)  09/01/2021, 3:26 PM

## 2021-09-01 NOTE — Progress Notes (Addendum)
PROGRESS NOTE    Sandra Jordan  GQQ:[id number] DOB: 02/29/1951 DOA: 08/30/2021 PCP: Marinda Elk, MD   Chief Complaint  Patient presents with   Weakness  Brief Narrative/Hospital Course: Sandra Jordan, 70 y.o. 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  She lives with her brother.  Then nephew brought the patient to the hospital  She was seen in oncology clinic on 10/26, had completed whole brain XRT, PET from September 14 showed multiple lung mets but no evidence of systemic disease other than patient's known lesion in her brain, oncology proceeded with carboplatinum Taxol and Avastin treatment reduced due to liver dysfunction.  In the ED labs showed hyponatremia, hypokalemia AKI creatinine 1.9 anion gap 20, Pro-Cal 1.2 lactic acid 1.9. COVID-19 present negative, urine/blood cultures sent.  CT head no acute finding, chest x-ray significantly improved aeration of the right upper lobe although persistent infiltrate remained.  Ultrasound 07/27/2021 echogenic liver with hepatic steatosis, S/P cholecystectomy Patient received vancomycin and cefepime 11/24 AM in ED.  Subjective: Seen and examined this morning. Patient is discharged able to tell me her name and her date of birth, blood pressure has been soft low-grade fever overnight. Foley was inserted last night and had 1.5 L urine out immediately  Assessment & Plan:  FTT w/ gen weakness: In the setting of metastatic cancer on chemotherapy, poor oral intake: Augment diet as tolerated, RD consult palliative care consult. PTOT eval as  able  Hypotension w/ Low grade fever/leukocytosis overnight 08/31/21: Severe Sepsis suspected Strep viridans in urine culture: UA wbc 6-10 Nitrite neg,le mod. Blood cx and urine cx pending.? Sepsis given recent chemotherapy use versus from volume depletion-overnight low-grade fever with leukocytosis..  Will give bolus normal saline, check serial lactic acid, follow-up urine and blood culture, start on empiric vancomycin and Zosyn. Aftet 1 liter bolus in 110/80- lactic acid up at 2.3- additional 1 liter being given to complete 38ml/kg bolus NS ( 1742ml).Later this am urine cx came back >100,000 Strep Viridans. On re-eval- lactic acid trending up 3.5, patient is shivering and while 2 liter is almost infused BP is in 80s.  Repeat lactate pending, I called ICU Attending for transfer for septic shock.  Acute metabolic encephalopathy: Patient N/V has been confused was found on feces lying in the bed, CT head no acute finding, likely multifactorial with significant electrolyte imbalance dehydration hypotension failure to thrive.  Providing and supportive care IV fluids.  Acute Hypoxic respiratory failure: patient needing 3l today.Has lung cancer- RULE, post obstructive, cont  o2, bronchodilators.  Hypernatremia Dehydration volume depletion: Sodium improving on D5 half-normal saline.  Continue IV fluids, monitor labs  Hypokalemia/hypophosphatemia/hypomagnesemia: Electrolytes replaced and improving.  This morning potassium phosphorus normalized.  Loose stool/diarrhea patient was covered in feces at home.  Check C. difficile GI panel.  IZT:IWPY poor oral intake/ dehydration improved with IV fluids.   Transaminitis: LFTs abnormal repeat LFTs this morning.Ongoing for some time noted by oncology and chemo dose was reduced.  Had cholecystectomy in previous ultrasound.  Stage 4 right upper lung adenocarcinoma with innumerable mets to brain: has had whole brain radiation in October, on carboplatin  Taxol and Avastin currently followed by outpatient oncology. Last chemo Wednesday, gets q3 wks  Anemia of chronic disease hemoglobin 8.4 g drop from 10.6 likely hemoconcentrated.  Monitor  Urine retention Foley inserted 11/24 with 1.5 L urine out immediately.  Continue the same.  Pressure injury sacrum and left thigh: POA wound care following continue wound dressing and local care.  Goals of care: Currently full code.  Discussed with patient's nephew-he understands overall patient's poor condition prognosis remains guarded at this time he wishes to continue full aggressive care  Addendum: Niece and nephew updated at bedside. Patient still lethargic able to recognize family member. BP 82/60 MAP maintained. I called and spoke w/ ICU Dr Lanney Gins 13:20 pm-advised transfer to Step Down Unit in ICU and he will evaluate her, likely needs pressors if still hypotensive.Monitor serial lactic acid. Consider CT abd pelvis if stable. Niece requesting regular updates- was at bedside. Niece reports patient was seen by palliative care as outpatient, and wished full care.  Overall prognosis is guarded explained to them. Reached out the palliative care who will be evaluating her.  Addendum: at 4:30 pm Came and saw pt in ICU, famiyl at bedside BP in 100s, pt feelign cold. Palliative and icu meeting concluded and now DNR/DNI Reviewed CT chest findings with pulmonary and with pt's family from [place] with obstructing central rt upper lobe lung mass with abrupt cut off of rt upper lobe bronchus, metastatic lymphadenopathy and metastic lesions in lung, suspect mass with compression causing possible dysphagia Another meeting with brother and family in near future to decide plan of course Niece/nephew at bedside and understands the poor prognosis overall  DVT prophylaxis: heparin injection 5,000 Units Start: 08/31/21 0100 SCDs Start: 08/31/21 0056 Code Status:   Code Status: Full Code Family Communication: plan  of care discussed with patient's nephew on the phone Status is: Inpatient Remains inpatient appropriate because: Remains inpatient for ongoing management of hypertension failure to thrive electrolyte imbalance Disposition: Currently not medically stable for discharge. Anticipated Disposition: TBD  Objective: Vitals last 24 hrs: Vitals:   09/01/21 0230 09/01/21 0347 09/01/21 0649 09/01/21 0810  BP:  96/64  (!) 81/55  Pulse:  (!) 110  (!) 107  Resp:  19 17 18   Temp:  98.6 F (37 C)  99 F (37.2 C)  TempSrc:  Axillary  Oral  SpO2:  98%  94%  Weight: 56.7 kg     Height:       Weight change: 0.136 kg  Intake/Output Summary (Last 24 hours) at 09/01/2021 1011 Last data filed at 09/01/2021 0102 Gross per 24 hour  Intake 3327.03 ml  Output 2490 ml  Net 837.03 ml   Net IO Since Admission: 3,134.95 mL [09/01/21 1011]   Physical Examination: General exam: Lethargic weak and frail oriented x1-2 ., older than stated age. HEENT:Oral mucosa moist, Ear/Nose WNL grossly,dentition normal. Respiratory system: B/l clear BS, no use of accessory muscle, non tender. Cardiovascular system: S1 & S2 +,No JVD. Gastrointestinal system: Abdomen soft, mild generalized tenderness present NT,ND, BS+. Nervous System:lethargic in fetal position Extremities: edema none distal peripheral pulses palpable.  Skin: No rashes, no icterus. MSK: Normal muscle bulk, tone, power.  Medications reviewed:  Scheduled Meds:  Chlorhexidine Gluconate Cloth  6 each Topical Daily   heparin  5,000 Units Subcutaneous Q8H   insulin aspart  0-15 Units Subcutaneous Q4H   potassium chloride  40 mEq Oral Once   Continuous Infusions:  dextrose 125 mL/hr at 08/31/21 2327   piperacillin-tazobactam (  ZOSYN)  IV     potassium chloride     vancomycin     Pressure Injury 08/31/21 Thigh Anterior;Left;Proximal 4cm by 5 cm by 4 mm depth, slough present (Active)  08/31/21 1630  Location: Thigh  Location Orientation:  Anterior;Left;Proximal  Staging:   Wound Description (Comments): 4cm by 5 cm by 4 mm depth, slough present  Present on Admission: Yes     Pressure Injury 08/31/21 Sacrum Medial;Lower Unstageable - Full thickness tissue loss in which the base of the injury is covered by slough (yellow, tan, gray, green or brown) and/or eschar (tan, brown or black) in the wound bed. 9 cm by 6 cm (Active)  08/31/21 1630  Location: Sacrum  Location Orientation: Medial;Lower  Staging: Unstageable - Full thickness tissue loss in which the base of the injury is covered by slough (yellow, tan, gray, green or brown) and/or eschar (tan, brown or black) in the wound bed.  Wound Description (Comments): 9 cm by 6 cm  Present on Admission: Yes    Diet Order             DIET - DYS 1 Room service appropriate? Yes; Fluid consistency: Thin  Diet effective now                 Weight change: 0.136 kg  Wt Readings from Last 3 Encounters:  09/01/21 56.7 kg  08/23/21 56.6 kg  08/09/21 54.4 kg    Consultants:see note  Procedures:see note Antimicrobials: Anti-infectives (From admission, onward)    Start     Dose/Rate Route Frequency Ordered Stop   09/01/21 1030  vancomycin (VANCOREADY) IVPB 1250 mg/250 mL        1,250 mg 166.7 mL/hr over 90 Minutes Intravenous  Once 09/01/21 0931     09/01/21 1000  piperacillin-tazobactam (ZOSYN) IVPB 3.375 g        3.375 g 12.5 mL/hr over 240 Minutes Intravenous Every 8 hours 09/01/21 0931     08/31/21 0100  vancomycin (VANCOCIN) IVPB 1000 mg/200 mL premix        1,000 mg 200 mL/hr over 60 Minutes Intravenous  Once 08/31/21 0056 08/31/21 0409   08/31/21 0100  ceFEPIme (MAXIPIME) 2 g in sodium chloride 0.9 % 100 mL IVPB        2 g 200 mL/hr over 30 Minutes Intravenous  Once 08/31/21 0056 08/31/21 0231      Culture/Microbiology    Component Value Date/Time   SDES  08/31/2021 0032    URINE, CATHETERIZED Performed at [facility], [address]  [place], [place] [address]    [facility]  08/31/2021 0032    NONE Performed at [facility], [address] [place], [place] [address]    CULT  08/31/2021 0032    CULTURE REINCUBATED FOR BETTER GROWTH Performed at [facility] [address], [place],  [address]    REPTSTATUS PENDING 08/31/2021 0032    Other culture-see note  Unresulted Labs (From admission, onward)     Start     Ordered   09/02/21 9509  Basic metabolic panel  Tomorrow morning,   R       Question:  Specimen collection method  Answer:  Unit=Unit collect   09/01/21 0824   09/01/21 0905  Procalcitonin  Daily,   R     Question:  Specimen collection method  Answer:  Unit=Unit collect   09/01/21 0904   09/01/21 0903  Lactic acid, plasma  STAT Now then every 2 hours,  STAT     Question:  Specimen collection method  Answer:  Unit=Unit collect   09/01/21 0904   09/01/21 0800  Culture, blood (Routine X 2) w Reflex to ID Panel  Once,   STAT        09/01/21 0800   09/01/21 0500  CBC  Daily,   STAT     Question:  Specimen collection method  Answer:  Lab=Lab collect   08/31/21 0852   09/01/21 0500  Hemoglobin A1c  Once,   R        09/01/21 0500   08/31/21 1048  Urinalysis, Complete w Microscopic Urine, Clean Catch  Once,   STAT        08/31/21 1047          Data Reviewed: I have personally reviewed following labs and imaging studies CBC: Recent Labs  Lab 08/30/21 2240 08/31/21 0517 09/01/21 0331  WBC 10.6* 10.6* 15.2*  HGB 12.4 10.6* 8.4*  HCT 39.0 34.2* 26.2*  MCV 93.3 94.7 94.2  PLT 195 170 919   Basic Metabolic Panel: Recent Labs  Lab 08/30/21 1411 08/31/21 0517 08/31/21 2200 09/01/21 0331 09/01/21 0833  NA 149* 151* 153* 151* 148*  K 3.4* 2.9* 3.0*  --  3.6  CL 107 114* 121*  --  118*  CO2 22 27 26   --  25  GLUCOSE 104* 123* 146*  --  111*  BUN 39* 31* 19  --  11  CREATININE 1.98* 1.01* 0.86  --  0.87  CALCIUM 9.3 8.6* 7.9*  --  7.8*  MG  --   --   --  1.9  --   PHOS   --   --  <1.0*  --  2.8   GFR: Estimated Creatinine Clearance: 51.6 mL/min (by C-G formula based on SCr of 0.87 mg/dL). Liver Function Tests: Recent Labs  Lab 08/31/21 0032 08/31/21 0517 08/31/21 2200  AST 63* 50*  --   ALT 135* 119*  --   ALKPHOS 293* 267*  --   BILITOT 1.1 1.0  --   PROT 6.2* 5.9*  --   ALBUMIN 3.0* 2.8* 2.4*   No results for input(s): LIPASE, AMYLASE in the last 168 hours. No results for input(s): AMMONIA in the last 168 hours. Coagulation Profile: No results for input(s): INR, PROTIME in the last 168 hours. Cardiac Enzymes: Recent Labs  Lab 08/30/21 2240  CKTOTAL 27*   BNP (last 3 results) No results for input(s): PROBNP in the last 8760 hours. HbA1C: No results for input(s): HGBA1C in the last 72 hours. CBG: Recent Labs  Lab 08/30/21 1412 08/31/21 2324 09/01/21 0007 09/01/21 0336 09/01/21 0808  GLUCAP 124* 136* 126* 152* 91   Lipid Profile: No results for input(s): CHOL, HDL, LDLCALC, TRIG, CHOLHDL, LDLDIRECT in the last 72 hours. Thyroid Function Tests: Recent Labs    08/31/21 0517  TSH 4.384   Anemia Panel: No results for input(s): VITAMINB12, FOLATE, FERRITIN, TIBC, IRON, RETICCTPCT in the last 72 hours. Sepsis Labs: Recent Labs  Lab 08/30/21 2240  PROCALCITON 1.28  LATICACIDVEN 1.9    Recent Results (from the past 240 hour(s))  Resp Panel by RT-PCR (Flu A&B, Covid) Nasopharyngeal Swab     Status: None   Collection Time: 08/30/21 10:40 PM   Specimen: Nasopharyngeal Swab; Nasopharyngeal(NP) swabs in vial transport medium  Result Value Ref Range Status   SARS Coronavirus 2 by RT PCR NEGATIVE NEGATIVE Final    Comment: (NOTE) SARS-CoV-2 target nucleic acids are  NOT DETECTED.  The SARS-CoV-2 RNA is generally detectable in upper respiratory specimens during the acute phase of infection. The lowest concentration of SARS-CoV-2 viral copies this assay can detect is 138 copies/mL. A negative result does not preclude  SARS-Cov-2 infection and should not be used as the sole basis for treatment or other patient management decisions. A negative result may occur with  improper specimen collection/handling, submission of specimen other than nasopharyngeal swab, presence of viral mutation(s) within the areas targeted by this assay, and inadequate number of viral copies(<138 copies/mL). A negative result must be combined with clinical observations, patient history, and epidemiological information. The expected result is Negative.  Fact Sheet for Patients:  EntrepreneurPulse.com.au  Fact Sheet for Healthcare Providers:  IncredibleEmployment.be  This test is no t yet approved or cleared by the [place] FDA and  has been authorized for detection and/or diagnosis of SARS-CoV-2 by FDA under an Emergency Use Authorization (EUA). This EUA will remain  in effect (meaning this test can be used) for the duration of the COVID-19 declaration under Section 564(b)(1) of the Act, 21 U.S.C.section 360bbb-3(b)(1), unless the authorization is terminated  or revoked sooner.       Influenza A by PCR NEGATIVE NEGATIVE Final   Influenza B by PCR NEGATIVE NEGATIVE Final    Comment: (NOTE) The Xpert Xpress SARS-CoV-2/FLU/RSV plus assay is intended as an aid in the diagnosis of influenza from Nasopharyngeal swab specimens and should not be used as a sole basis for treatment. Nasal washings and aspirates are unacceptable for Xpert Xpress SARS-CoV-2/FLU/RSV testing.  Fact Sheet for Patients: EntrepreneurPulse.com.au  Fact Sheet for Healthcare Providers: IncredibleEmployment.be  This test is not yet approved or cleared by the [place] FDA and has been authorized for detection and/or diagnosis of SARS-CoV-2 by FDA under an Emergency Use Authorization (EUA). This EUA will remain in effect (meaning this test can be used) for the duration of  the COVID-19 declaration under Section 564(b)(1) of the Act, 21 U.S.C. section 360bbb-3(b)(1), unless the authorization is terminated or revoked.  Performed at [facility], [address] [place], [place] [address]   Blood culture (routine x 2)     Status: None (Preliminary result)   Collection Time: 08/30/21 10:40 PM   Specimen: BLOOD  Result Value Ref Range Status   Specimen Description BLOOD RCHEST PORK  Final   Special Requests   Final    BOTTLES DRAWN AEROBIC AND ANAEROBIC Blood Culture adequate volume   Culture   Final    NO GROWTH 2 DAYS Performed at [facility], [address], [place], [place] [address]    Report Status PENDING  Incomplete  Urine Culture     Status: None (Preliminary result)   Collection Time: 08/31/21 12:32 AM   Specimen: Urine, Catheterized  Result Value Ref Range Status   Specimen Description   Final    URINE, CATHETERIZED Performed at [facility], [address], [place], [place] [address]    Special Requests   Final    NONE Performed at [facility], [address], [place], [place] [address]    Culture   Final    CULTURE REINCUBATED FOR BETTER GROWTH Performed at [facility], [address], [place], [place] [address]    Report Status PENDING  Incomplete     Radiology Studies: CT Head Wo Contrast  Result Date: 08/30/2021 CLINICAL DATA:  Altered mental status EXAM: CT HEAD WITHOUT CONTRAST TECHNIQUE: Contiguous axial images were obtained  from the base of the skull through the vertex without intravenous contrast. COMPARISON:  CT head 06/13/2021 BRAIN: BRAIN Cerebral ventricle sizes are concordant with the degree of cerebral volume loss. Patchy and confluent areas of decreased attenuation are noted throughout the deep and periventricular white matter of the cerebral hemispheres bilaterally, compatible with chronic microvascular ischemic disease. No evidence of large-territorial acute infarction.  No parenchymal hemorrhage. No mass lesion. No extra-axial collection. No mass effect or midline shift. No hydrocephalus. Basilar cisterns are patent. Vascular: No hyperdense vessel. Skull: No acute fracture or focal lesion. Sinuses/Orbits: Paranasal sinuses and mastoid air cells are clear. The orbits are unremarkable. Other: None. IMPRESSION: No acute intracranial abnormality. Electronically Signed   By: Iven Finn M.D.   On: 08/30/2021 23:03   DG Chest Portable 1 View  Result Date: 08/31/2021 CLINICAL DATA:  Weakness and possible sepsis EXAM: PORTABLE CHEST 1 VIEW COMPARISON:  06/12/2021 FINDINGS: Cardiac shadow is within normal limits. Right chest wall port is now seen in satisfactory position. Previously seen consolidation in the right upper lobe has nearly completely resolved although some minimal residual opacity remains. No bony abnormality is noted. IMPRESSION: Significant improved aeration in the right upper lobe although some persistent infiltrate remains. Electronically Signed   By: Inez Catalina M.D.   On: 08/31/2021 00:53     LOS: 1 day   Antonieta Pert, MD [facility]  09/01/2021, 10:11 AM

## 2021-09-01 NOTE — Consult Note (Signed)
I have placed a request via Secure Chat to Dr. Maren Beach requesting photos of the wound areas of concern to be placed in the EMR.    [place], [place], [place]

## 2021-09-02 LAB — CBC
HCT: 25 % — ABNORMAL LOW (ref 36.0–46.0)
Hemoglobin: 7.8 g/dL — ABNORMAL LOW (ref 12.0–15.0)
MCH: 29.2 pg (ref 26.0–34.0)
MCHC: 31.2 g/dL (ref 30.0–36.0)
MCV: 93.6 fL (ref 80.0–100.0)
Platelets: 146 10*3/uL — ABNORMAL LOW (ref 150–400)
RBC: 2.67 MIL/uL — ABNORMAL LOW (ref 3.87–5.11)
RDW: 21.5 % — ABNORMAL HIGH (ref 11.5–15.5)
WBC: 19.7 10*3/uL — ABNORMAL HIGH (ref 4.0–10.5)
nRBC: 1.6 % — ABNORMAL HIGH (ref 0.0–0.2)

## 2021-09-02 LAB — BASIC METABOLIC PANEL
Anion gap: 7 (ref 5–15)
BUN: 6 mg/dL — ABNORMAL LOW (ref 8–23)
CO2: 24 mmol/L (ref 22–32)
Calcium: 8.2 mg/dL — ABNORMAL LOW (ref 8.9–10.3)
Chloride: 128 mmol/L — ABNORMAL HIGH (ref 98–111)
Creatinine, Ser: 0.9 mg/dL (ref 0.44–1.00)
GFR, Estimated: >60 mL/min (ref >60)
Glucose, Bld: 99 mg/dL (ref 70–99)
Potassium: 3.2 mmol/L — ABNORMAL LOW (ref 3.5–5.1)
Sodium: 159 mmol/L — ABNORMAL HIGH (ref 135–145)

## 2021-09-02 LAB — POTASSIUM: Potassium: 3.5 mmol/L (ref 3.5–5.1)

## 2021-09-02 LAB — GLUCOSE, CAPILLARY
Glucose-Capillary: 95 mg/dL (ref 70–99)
Glucose-Capillary: 98 mg/dL (ref 70–99)
Glucose-Capillary: 90 mg/dL (ref 70–99)
Glucose-Capillary: 95 mg/dL (ref 70–99)
Glucose-Capillary: 100 mg/dL — ABNORMAL HIGH (ref 70–99)
Glucose-Capillary: 105 mg/dL — ABNORMAL HIGH (ref 70–99)

## 2021-09-02 LAB — MAGNESIUM: Magnesium: 1.6 mg/dL — ABNORMAL LOW (ref 1.7–2.4)

## 2021-09-02 LAB — PROCALCITONIN: Procalcitonin: 0.89 ng/mL

## 2021-09-02 LAB — PHOSPHORUS: Phosphorus: 2.8 mg/dL (ref 2.5–4.6)

## 2021-09-02 MED ORDER — POTASSIUM CHLORIDE 10 MEQ/50ML IV SOLN
10.0000 meq | INTRAVENOUS | Status: AC
  Administered 2021-09-02 (×5): 10 meq via INTRAVENOUS
  Filled 2021-09-02 (×6): qty 50

## 2021-09-02 MED ORDER — CEFTRIAXONE SODIUM 2 G IJ SOLR
2.0000 g | INTRAMUSCULAR | Status: DC
  Administered 2021-09-02 – 2021-09-05 (×4): 2 g via INTRAVENOUS
  Filled 2021-09-02: qty 20
  Filled 2021-09-02 (×2): qty 2
  Filled 2021-09-02: qty 20
  Filled 2021-09-02: qty 2

## 2021-09-02 MED ORDER — DEXTROSE 10 % IV SOLN: INTRAVENOUS | Status: DC

## 2021-09-02 MED ORDER — MAGNESIUM SULFATE 4 GM/100ML IV SOLN
4.0000 g | Freq: Once | INTRAVENOUS | Status: AC
  Administered 2021-09-02: 4 g via INTRAVENOUS
  Filled 2021-09-02: qty 100

## 2021-09-02 NOTE — Progress Notes (Signed)
CRITICAL CARE PROGRESS NOTE    Name: Sandra Jordan MRN: [id number] DOB: 1951-08-26     LOS: 2   SUBJECTIVE FINDINGS & SIGNIFICANT EVENTS    Patient description:   This is a 70 yo F with hx of stage 4 lung cancer with brain metastasis, failure to thrive, found home covered in feces.    I met with family and had goals of care conference, nephew and niece are present and share patient is currently full code. She resides her brother who has psychiatric diagnosis on therapy and patient cares for him medically due to his psychiatric illness. They have met with palliative already and have additional meetings planned at some point.   Additional Data from admission report including:   labs showed hyponatremia, hypokalemia AKI creatinine 1.9 anion gap 20, Pro-Cal 1.2 lactic acid 1.9. COVID-19 present negative, urine/blood cultures sent.  CT head no acute finding, chest x-ray significantly improved aeration of the right upper lobe although persistent infiltrate remained.  Ultrasound 07/27/2021 echogenic liver with hepatic steatosis, S/P cholecystectomy Patient received vancomycin and cefepime 11/24 AM in ED.   09/02/21- patient improved post IVF and abx for UTI. She is more lucid and communicative. She is off vasopressors now.  She is DNR Galvin Proffer.  O2 is on room air.   Lines/tubes : Implanted Port 07/24/21 Right Chest (Active)  Site Assessment Clean;Dry;Intact 08/31/21 2100  Port Intervention Flushed;Deaccessed - Gauze Dressing Placed 08/23/21 1504  Needle Size H 20 gauge 08/31/21 1630  Line Status Infusing 08/31/21 2100  Line Care Tubing changed;Connections checked and tightened 08/31/21 2100  Dressing Type Transparent 08/31/21 2100  Dressing Status Clean;Dry;Intact 08/31/21 2100  Antimicrobial disc in place? Yes  08/31/21 2100  Dressing Intervention New dressing 08/23/21 0824  Needle Change Due 09/06/21 08/31/21 2100     Urethral Catheter Jun, RN Non-latex 14 Fr. (Active)  Indication for Insertion or Continuance of Catheter Healing of stage 3 or 4 pressure injuries AND incontinence 09/01/21 1027  Site Assessment Clean;Intact;Dry 09/01/21 1027  Catheter Maintenance Bag below level of bladder;Catheter secured;Drainage bag/tubing not touching floor;Insertion date on drainage bag;No dependent loops;Seal intact;Bag emptied prior to transport 09/01/21 1027  Collection Container Standard drainage bag 09/01/21 1027  Securement Method Securing device (Describe) 09/01/21 1027  Output (mL) 380 mL 09/01/21 0338    Microbiology/Sepsis markers: Results for orders placed or performed during the hospital encounter of 08/30/21  Resp Panel by RT-PCR (Flu A&B, Covid) Nasopharyngeal Swab     Status: None   Collection Time: 08/30/21 10:40 PM   Specimen: Nasopharyngeal Swab; Nasopharyngeal(NP) swabs in vial transport medium  Result Value Ref Range Status   SARS Coronavirus 2 by RT PCR NEGATIVE NEGATIVE Final    Comment: (NOTE) SARS-CoV-2 target nucleic acids are NOT DETECTED.  The SARS-CoV-2 RNA is generally detectable in upper respiratory specimens during the acute phase of infection. The lowest concentration of SARS-CoV-2 viral copies this assay can detect is 138 copies/mL. A negative result does not preclude SARS-Cov-2 infection and should not be used as the sole basis for treatment or other patient management decisions. A negative result may occur with  improper specimen collection/handling, submission of specimen other than nasopharyngeal swab, presence of viral mutation(s) within the areas targeted by this assay, and inadequate number of viral copies(<138 copies/mL). A negative result must be combined with clinical observations, patient history, and epidemiological information. The expected result is  Negative.  Fact Sheet for Patients:  EntrepreneurPulse.com.au  Fact Sheet for Healthcare Providers:  IncredibleEmployment.be  This test is no t yet approved or cleared by the [place] and  has been authorized for detection and/or diagnosis of SARS-CoV-2 by FDA under an Emergency Use Authorization (EUA). This EUA will remain  in effect (meaning this test can be used) for the duration of the COVID-19 declaration under Section 564(b)(1) of the Act, 21 U.S.C.section 360bbb-3(b)(1), unless the authorization is terminated  or revoked sooner.       Influenza A by PCR NEGATIVE NEGATIVE Final   Influenza B by PCR NEGATIVE NEGATIVE Final    Comment: (NOTE) The Xpert Xpress SARS-CoV-2/FLU/RSV plus assay is intended as an aid in the diagnosis of influenza from Nasopharyngeal swab specimens and should not be used as a sole basis for treatment. Nasal washings and aspirates are unacceptable for Xpert Xpress SARS-CoV-2/FLU/RSV testing.  Fact Sheet for Patients: EntrepreneurPulse.com.au  Fact Sheet for Healthcare Providers: IncredibleEmployment.be  This test is not yet approved or cleared by the [place] FDA and has been authorized for detection and/or diagnosis of SARS-CoV-2 by FDA under an Emergency Use Authorization (EUA). This EUA will remain in effect (meaning this test can be used) for the duration of the COVID-19 declaration under Section 564(b)(1) of the Act, 21 U.S.C. section 360bbb-3(b)(1), unless the authorization is terminated or revoked.  Performed at [facility], [address] [place], [place] [address]   Blood culture (routine x 2)     Status: None (Preliminary result)   Collection Time: 08/30/21 10:40 PM   Specimen: BLOOD  Result Value Ref Range Status   Specimen Description BLOOD RCHEST PORK  Final   Special Requests   Final    BOTTLES DRAWN AEROBIC AND ANAEROBIC Blood  Culture adequate volume   Culture   Final    NO GROWTH 3 DAYS Performed at [facility], [address] [place], [place] [address]    Report Status PENDING  Incomplete  Urine Culture     Status: Abnormal   Collection Time: 08/31/21 12:32 AM   Specimen: Urine, Catheterized  Result Value Ref Range Status   Specimen Description   Final    URINE, CATHETERIZED Performed at [facility], [address], [place], [place] [address]    Special Requests   Final    NONE Performed at [facility], [address] [place], [place] [address]    Culture (A)  Final    >=100,000 COLONIES/mL VIRIDANS STREPTOCOCCUS Standardized susceptibility testing for this organism is not available. Performed at [facility], [place] [address], [place], [place] [address]    Report Status 09/01/2021 FINAL  Final  Culture, blood (Routine X 2) w Reflex to ID Panel     Status: None (Preliminary result)   Collection Time: 09/01/21  8:22 AM   Specimen: BLOOD  Result Value Ref Range Status   Specimen Description BLOOD BLOOD LEFT HAND  Final   Special Requests   Final    BOTTLES DRAWN AEROBIC AND ANAEROBIC Blood Culture adequate volume   Culture   Final    NO GROWTH < 24 HOURS Performed at [facility], [address], [place], [place] [address]    Report Status PENDING  Incomplete  C Difficile Quick Screen w PCR reflex     Status: None   Collection Time: 09/01/21  2:09 PM   Specimen: STOOL  Result Value Ref Range Status   C Diff antigen NEGATIVE NEGATIVE Final   C Diff toxin NEGATIVE NEGATIVE Final   C Diff  interpretation No C. difficile detected.  Final    Comment: Performed at [facility], [address] [place], [place] [address]  Gastrointestinal Panel by PCR , Stool     Status: None   Collection Time: 09/01/21  2:09 PM   Specimen: Stool  Result Value Ref Range Status   Campylobacter species NOT DETECTED NOT DETECTED Final   Plesimonas shigelloides  NOT DETECTED NOT DETECTED Final   Salmonella species NOT DETECTED NOT DETECTED Final   Yersinia enterocolitica NOT DETECTED NOT DETECTED Final   Vibrio species NOT DETECTED NOT DETECTED Final   Vibrio cholerae NOT DETECTED NOT DETECTED Final   Enteroaggregative E coli (EAEC) NOT DETECTED NOT DETECTED Final   Enteropathogenic E coli (EPEC) NOT DETECTED NOT DETECTED Final   Enterotoxigenic E coli (ETEC) NOT DETECTED NOT DETECTED Final   Shiga like toxin producing E coli (STEC) NOT DETECTED NOT DETECTED Final   Shigella/Enteroinvasive E coli (EIEC) NOT DETECTED NOT DETECTED Final   Cryptosporidium NOT DETECTED NOT DETECTED Final   Cyclospora cayetanensis NOT DETECTED NOT DETECTED Final   Entamoeba histolytica NOT DETECTED NOT DETECTED Final   Giardia lamblia NOT DETECTED NOT DETECTED Final   Adenovirus F40/41 NOT DETECTED NOT DETECTED Final   Astrovirus NOT DETECTED NOT DETECTED Final   Norovirus GI/GII NOT DETECTED NOT DETECTED Final   Rotavirus A NOT DETECTED NOT DETECTED Final   Sapovirus (I, II, IV, and V) NOT DETECTED NOT DETECTED Final    Comment: Performed at [facility], [address] [place], [place] [address]  MRSA Next Gen by PCR, Nasal     Status: None   Collection Time: 09/01/21  3:03 PM   Specimen: Nasal Mucosa; Nasal Swab  Result Value Ref Range Status   MRSA by PCR Next Gen NOT DETECTED NOT DETECTED Final    Comment: (NOTE) The GeneXpert MRSA Assay (FDA approved for NASAL specimens only), is one component of a comprehensive MRSA colonization surveillance program. It is not intended to diagnose MRSA infection nor to guide or monitor treatment for MRSA infections. Test performance is not FDA approved in patients less than 45 years old. Performed at [facility], [address] [place], [place] [address]     Anti-infectives:  Anti-infectives (From admission, onward)    Start     Dose/Rate Route Frequency Ordered Stop   09/02/21 1800   cefTRIAXone (ROCEPHIN) 2 g in sodium chloride 0.9 % 100 mL IVPB        2 g 200 mL/hr over 30 Minutes Intravenous Every 24 hours 09/02/21 1330     09/02/21 1000  vancomycin (VANCOREADY) IVPB 1000 mg/200 mL  Status:  Discontinued        1,000 mg 200 mL/hr over 60 Minutes Intravenous Every 24 hours 09/01/21 1523 09/02/21 1330   09/01/21 1030  vancomycin (VANCOREADY) IVPB 1250 mg/250 mL        1,250 mg 166.7 mL/hr over 90 Minutes Intravenous  Once 09/01/21 0931 09/01/21 1238   09/01/21 1000  piperacillin-tazobactam (ZOSYN) IVPB 3.375 g  Status:  Discontinued        3.375 g 12.5 mL/hr over 240 Minutes Intravenous Every 8 hours 09/01/21 0931 09/02/21 1330   08/31/21 0100  vancomycin (VANCOCIN) IVPB 1000 mg/200 mL premix        1,000 mg 200 mL/hr over 60 Minutes Intravenous  Once 08/31/21 0056 08/31/21 0409   08/31/21 0100  ceFEPIme (MAXIPIME) 2 g in sodium chloride 0.9 % 100 mL IVPB  2 g 200 mL/hr over 30 Minutes Intravenous  Once 08/31/21 0056 08/31/21 0231        PAST MEDICAL HISTORY   Past Medical History:  Diagnosis Date   Allergy    Colon polyp    Hyperlipidemia    Hypertension    Lung cancer (HCC)      SURGICAL HISTORY   Past Surgical History:  Procedure Laterality Date   ABDOMINAL HYSTERECTOMY  1984   BREAST BIOPSY Bilateral 1990   fibrocystic breast   BREAST BIOPSY Right August 2014   fibrocystic breast   CHOLECYSTECTOMY  2008   COLONOSCOPY  2012, 2017   [facility] CV LINE INJECTION  07/07/2021   IR IMAGING GUIDED PORT INSERTION  06/20/2021   IR IMAGING GUIDED PORT INSERTION  07/24/2021     FAMILY HISTORY   Family History  Problem Relation Age of Onset   Lung cancer Mother    Colon cancer Father 68   Prostate cancer Father    Lung cancer Sister    Lung cancer Maternal Uncle    Stomach cancer Maternal Grandfather    Breast cancer Niece      SOCIAL HISTORY   Social History   Tobacco Use   Smoking status: Former    Years: 15.00    Types:  Cigarettes    Quit date: 10/08/2004    Years since quitting: 16.9   Smokeless tobacco: Never  Substance Use Topics   Alcohol use: No    Alcohol/week: 0.0 standard drinks   Drug use: No     MEDICATIONS   Current Medication:  Current Facility-Administered Medications:    acetaminophen (TYLENOL) tablet 650 mg, 650 mg, Oral, Q6H PRN **OR** acetaminophen (TYLENOL) suppository 650 mg, 650 mg, Rectal, Q6H PRN, Patel, Ekta V, MD, 650 mg at 09/01/21 1602   albumin human 25 % solution 25 g, 25 g, Intravenous, Q6H, Rust-Chester, Britton L, NP, Last Rate: 60 mL/hr at 09/02/21 1158, 25 g at 09/02/21 1158   cefTRIAXone (ROCEPHIN) 2 g in sodium chloride 0.9 % 100 mL IVPB, 2 g, Intravenous, Q24H, Kc, Ramesh, MD   Chlorhexidine Gluconate Cloth 2 % PADS 6 each, 6 each, Topical, Daily, Patel, Ekta V, MD, 6 each at 09/02/21 1033   collagenase (SANTYL) ointment, , Topical, Daily, Kc, Ramesh, MD, Given at 09/02/21 1033   dextrose 10 % infusion, , Intravenous, Continuous, Rust-Chester, Britton L, NP, Last Rate: 30 mL/hr at 09/02/21 1200, Infusion Verify at 09/02/21 1200   feeding supplement (ENSURE ENLIVE / ENSURE PLUS) liquid 237 mL, 237 mL, Oral, TID BM, Kc, Ramesh, MD, 237 mL at 09/02/21 1033   heparin injection 5,000 Units, 5,000 Units, Subcutaneous, Q8H, Patel, Ekta V, MD, 5,000 Units at 09/02/21 0521   insulin aspart (novoLOG) injection 0-9 Units, 0-9 Units, Subcutaneous, Q4H, Rust-Chester, Britton L, NP   lactated ringers infusion, , Intravenous, Continuous, Alitzel Cookson, MD, Last Rate: 100 mL/hr at 09/02/21 1200, Infusion Verify at 09/02/21 1200   MEDLINE mouth rinse, 15 mL, Mouth Rinse, BID, Rust-Chester, Britton L, NP, 15 mL at 09/02/21 1034   midodrine (PROAMATINE) tablet 2.5 mg, 2.5 mg, Oral, TID WC, Teondra Newburg, MD, 2.5 mg at 09/02/21 1215   multivitamin with minerals tablet 1 tablet, 1 tablet, Oral, Daily, Kc, Ramesh, MD, 1 tablet at 09/02/21 1030  Facility-Administered Medications Ordered  in Other Encounters:    heparin lock flush 100 unit/mL, 500 Units, Intravenous, Once, Borders, Joshua R, NP    ALLERGIES   Patient has   no known allergies.    REVIEW OF SYSTEMS     Unable to obtain due to confusion  PHYSICAL EXAMINATION   Vital Signs: Temp:  [97.7 F (36.5 C)-99.6 F (37.6 C)] 97.9 F (36.6 C) (11/26 1200) Pulse Rate:  [37-122] 94 (11/26 1300) Resp:  [16-36] 28 (11/26 1300) BP: (67-209)/(52-153) 111/60 (11/26 1300) SpO2:  [90 %-100 %] 100 % (11/26 1300) Weight:  [55.5 kg] 55.5 kg (11/26 0334)  GENERAL:Age appropriate chronically ill appearing HEAD: Normocephalic, atraumatic.  EYES: Pupils equal, round, reactive to light.  No scleral icterus.  MOUTH: Moist mucosal membrane. NECK: Supple. No thyromegaly. No nodules. No JVD.  PULMONARY: mild rhonchi b/l CARDIOVASCULAR: S1 and S2. Regular rate and rhythm. No murmurs, rubs, or gallops.  GASTROINTESTINAL: Soft, nontender, non-distended. No masses. Positive bowel sounds. No hepatosplenomegaly.  MUSCULOSKELETAL: No swelling, clubbing, or edema.  NEUROLOGIC: mildly confused with NO FND grossly SKIN:intact,warm,dry   PERTINENT DATA     Infusions:  albumin human 25 g (09/02/21 1158)   cefTRIAXone (ROCEPHIN)  IV     dextrose 30 mL/hr at 09/02/21 1200   lactated ringers 100 mL/hr at 09/02/21 1200   Scheduled Medications:  Chlorhexidine Gluconate Cloth  6 each Topical Daily   collagenase   Topical Daily   feeding supplement  237 mL Oral TID BM   heparin  5,000 Units Subcutaneous Q8H   insulin aspart  0-9 Units Subcutaneous Q4H   mouth rinse  15 mL Mouth Rinse BID   midodrine  2.5 mg Oral TID WC   multivitamin with minerals  1 tablet Oral Daily   PRN Medications: acetaminophen **OR** acetaminophen Hemodynamic parameters:   Intake/Output: 11/25 0701 - 11/26 0700 In: 1930.5 [I.V.:1670.6; IV Piggyback:259.9] Out: 5150 [Urine:4100; Stool:1050]  Ventilator  Settings:    LAB RESULTS:  Basic  Metabolic Panel: Recent Labs  Lab 08/30/21 1411 08/31/21 0517 08/31/21 2200 09/01/21 0331 09/01/21 0833 09/02/21 0331  NA 149* 151* 153* 151* 148* 159*  K 3.4* 2.9* 3.0*  --  3.6 3.2*  CL 107 114* 121*  --  118* 128*  CO2 22 27 26  --  25 24  GLUCOSE 104* 123* 146*  --  111* 99  BUN 39* 31* 19  --  11 6*  CREATININE 1.98* 1.01* 0.86  --  0.87 0.90  CALCIUM 9.3 8.6* 7.9*  --  7.8* 8.2*  MG  --   --   --  1.9  --  1.6*  PHOS  --   --  <1.0*  --  2.8 2.8    Liver Function Tests: Recent Labs  Lab 08/31/21 0032 08/31/21 0517 08/31/21 2200 09/01/21 1126  AST 63* 50*  --  36  ALT 135* 119*  --  73*  ALKPHOS 293* 267*  --  192*  BILITOT 1.1 1.0  --  0.7  PROT 6.2* 5.9*  --  4.7*  ALBUMIN 3.0* 2.8* 2.4* 2.1*    No results for input(s): LIPASE, AMYLASE in the last 168 hours. No results for input(s): AMMONIA in the last 168 hours. CBC: Recent Labs  Lab 08/30/21 2240 08/31/21 0517 09/01/21 0331 09/02/21 0331  WBC 10.6* 10.6* 15.2* 19.7*  HGB 12.4 10.6* 8.4* 7.8*  HCT 39.0 34.2* 26.2* 25.0*  MCV 93.3 94.7 94.2 93.6  PLT 195 170 161 146*    Cardiac Enzymes: Recent Labs  Lab 08/30/21 2240  CKTOTAL 27*    BNP: Invalid input(s): POCBNP CBG: Recent Labs  Lab 09/01/21 1943 09/01/21 2321 09/02/21 0329   09/02/21 0738 09/02/21 1116  GLUCAP 140* 80 95 98 90        IMAGING RESULTS:  Imaging: No results found. _0 @ No results found.             IMPRESSION: 1. Dominant right upper lobe mass with truncation of the right upper lobe bronchus, with extensive associated thoracic adenopathy as well as intracranial metastatic lesions. 2. A 2.3 by 1.9 cm somewhat irregular right lower lobe nodule could represent a metastatic lesion or synchronous low-grade adenocarcinoma, but has only low-grade activity with maximum SUV of 1.4. The other tiny pulmonary nodules scattered in the right lung are too small to characterize. 3. No compelling findings  of metastatic involvement of the abdomen/pelvis or visualized skeleton. 4. Other imaging findings of potential clinical significance: Aortic Atherosclerosis (ICD10-I70.0). Nonobstructive left nephrolithiasis. Scattered small type 1 hiatal hernia.   ASSESSMENT AND PLAN    -Multidisciplinary rounds held today  Acute Hypoxic Respiratory Failure -patient requiring 3L/min  -post obstructive RUL Adenocarcinoma tumor with right mainstem lesion mass effect as noted in CT above -continue Bronchodilator Therapy   Goals of care conference    Code status :DNR /DNI   Septic shock -present on admission -urinary tract infection          Strep viridans     On antibiotics-vanc/zosyn empirically -use vasopressors to keep MAP>65 -follow ABG and LA -follow up cultures -emperic ABX    Altered Mental status with encephalopathy - suspect due to septic encephalopathy and due to progressive metastatic brain cancer   ID -continue IV abx as prescibed -follow up cultures  GI/Nutrition GI PROPHYLAXIS as indicated DIET-->TF's as tolerated Constipation protocol as indicated  ENDO - ICU hypoglycemic\Hyperglycemia protocol -check FSBS per protocol   ELECTROLYTES -follow labs as needed -replace as needed -pharmacy consultation   DVT/GI PRX ordered -SCDs  TRANSFUSIONS AS NEEDED MONITOR FSBS ASSESS the need for LABS as needed   Critical care provider statement:   Total critical care time: 109 minutes   Performed by: Lanney Gins MD   Critical care time was exclusive of separately billable procedures and treating other patients.   Critical care was necessary to treat or prevent imminent or life-threatening deterioration.   Critical care was time spent personally by me on the following activities: development of treatment plan with patient and/or surrogate as well as nursing, discussions with consultants, evaluation of patient's response to treatment, examination of patient, obtaining  history from patient or surrogate, ordering and performing treatments and interventions, ordering and review of laboratory studies, ordering and review of radiographic studies, pulse oximetry and re-evaluation of patient's condition.    Ottie Glazier, M.D.  Pulmonary & Critical Care Medicine       Ottie Glazier, M.D.  Division of [place]

## 2021-09-02 NOTE — Progress Notes (Addendum)
PHARMACY CONSULT NOTE - FOLLOW UP  Pharmacy Consult for Electrolyte Monitoring and Replacement   Recent Labs: Potassium (mmol/L)  Date Value  09/02/2021 3.2 (L)  01/26/2013 3.3 (L)   Magnesium (mg/dL)  Date Value  09/02/2021 1.6 (L)  01/26/2013 2.1   Calcium (mg/dL)  Date Value  09/02/2021 8.2 (L)   Calcium, Total (mg/dL)  Date Value  01/26/2013 9.5   Albumin (g/dL)  Date Value  09/01/2021 2.1 (L)   Phosphorus (mg/dL)  Date Value  09/02/2021 2.8   Sodium (mmol/L)  Date Value  09/02/2021 159 (H)  01/26/2013 139   Corr Ca: 9.72 mg/dL  Assessment: 70 y.o. female admitted on 08/30/2021 with lethargy, weakness and dysuria. Pharmacy has been consulted for electrolyte replacement.   Goal of Therapy:  Electrolytes WNL  Plan:  Na 159 - on IV fluids along with D10  K 3.2 - pt has already received 60 mEq IV Kcl; will recheck K at 2000 Mg 1.6 - pt has already received 4 g IV Mg sulfate Recheck electrolytes with AM labs  Sherilyn Banker ,PharmD Clinical Pharmacist 09/02/2021 2:57 PM

## 2021-09-02 NOTE — Progress Notes (Signed)
PROGRESS NOTE    Sandra Jordan  WUJ:[id number] DOB: 1951/01/02 DOA: 08/30/2021 PCP: Marinda Elk, MD   Chief Complaint  Patient presents with   Weakness  Brief Narrative/Hospital Course: Sandra Jordan, 70 y.o. 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  She lives with her brother.  Then nephew brought the patient to the hospital  She was seen in oncology clinic on 10/26, had completed whole brain XRT, PET from September 14 showed multiple lung mets but no evidence of systemic disease other than patient's known lesion in her brain, oncology proceeded with carboplatinum Taxol and Avastin treatment reduced due to liver dysfunction.  In the ED labs showed hyponatremia, hypokalemia AKI creatinine 1.9 anion gap 20, Pro-Cal 1.2 lactic acid 1.9. COVID-19 present negative, urine/blood cultures sent.  CT head no acute finding, chest x-ray significantly improved aeration of the right upper lobe although persistent infiltrate remained.  Ultrasound 07/27/2021 echogenic liver with hepatic steatosis, S/P cholecystectomy  Subjective: This morning she is more alert awake, niece and other family numbers at the bedside.  Continues to have copious amount of diarrhea in rectal tube in place, overnight hypoglycemic blood pressure has been soft Foley having clear urine output  Assessment & Plan:  Severe sepsis POA likely from UTI  Hypotension Sepsis criteria with hypotension w/ Low grade fever/leukocytosis. Strep viridans in urine culture.UA wbc 6-10 Nitrite neg,le mod. Blood cx no growth so far.  Hypertension also  due to dehydration diarrhea poor oral intake. chemotherapy use last Wednesday. Lactic acid elevated after 3.4 and downtrending, at this time patient has received IV albumin and IV fluid aggressively blood pressure 90s to 100.  Continue to monitor in stepdown unit appreciate ICU input.  Continue midodrine, cont vancomycin and Zosyn can de-escalate to ceftriaxone.  Acute metabolic encephalopathy: More alert awake today.  Suspect in the setting of sepsis dehydration deconditioning.  She was found confused was found on feces lying in the bed, CT head no acute finding, likely multifactorial with significant electrolyte imbalance dehydration hypotension failure to thrive also has brain metastasis.  Acute Hypoxic respiratory failure: Currently doing well on room air.Has lung cancer- RULE, post obstructive, cont [place] o2, bronchodilators.  FTT w/ gen weakness:In the setting of metastatic cancer on chemotherapy, poor oral intake: Augment diet as tolerated, RD consult palliative care consult. PTOT eval as able  Hypernatremia Dehydration volume depletion: Sodium at 159, continues need IV fluids along with D10 due to ongoing diarrhea as well as hypoglycemia.  Monitor labs.    Hypokalemia/hypophosphatemia/hypomagnesemia: Hypomagnesemia and hypokalemia being replaced  Loose stool/diarrhea patient was covered in feces at home.  Negative GI panel and C. Difficile.  Rectal tube, supportive care.  If persistent consider CT scan  NFA:OZHY poor oral intake/ dehydration improved with IV fluids.   Transaminitis: AST improved to 36 ALT still limited 43.  [place] Had cholecystectomy in previous ultrasound.  Stage 4 right upper lung adenocarcinoma with innumerable mets to brain: has had whole brain radiation in October, on carboplatin Taxol and Avastin currently followed by outpatient oncology. Last chemo Wednesday, gets q3 wks. wondering if mass is having local compression effect on  the airway as well as esophagus  causing dysphagia difficult to with intake  Anemia of chronic disease hemoglobin 8.4 g drop from 10.6 likely hemoconcentrated.  Also recent chemo use.  Transfuse if less than 7 g.  Monitor Recent Labs  Lab 08/30/21 2240 08/31/21 0517 09/01/21 0331 09/02/21 0331  HGB 12.4 10.6* 8.4* 7.8*  HCT 39.0 34.2* 26.2* 25.0*    Urine retention Foley inserted 11/24 with 1.5 L urine out immediately.  Continue the same.  Pressure injury sacrum and left thigh: POA wound care following continue wound dressing and local care.   Hypoglycemia: In the setting of poor oral intake sepsis.  Continue D10 infusion Recent Labs  Lab 09/01/21 1943 09/01/21 2321 09/02/21 0329 09/02/21 0738 09/02/21 1116  GLUCAP 140* 80 95 98 90    Goals of care: was full code.  Palliative meeting held and patient has been transitioned to DNR/DNI.  Family would like to keep the patient comfortable but continue on current treatment.  If further declines consider hospice.   Code Status:   Code Status: DNR Family Communication: plan of care discussed with patient's nephew, niece at the bedside Status is: Inpatient Remains inpatient appropriate because: Remains inpatient for ongoing management of hypertension failure to thrive electrolyte imbalance Disposition: Currently not medically stable for discharge. Anticipated Disposition: TBD  Objective: Vitals last 24 hrs: Vitals:   09/02/21 0900 09/02/21 1000 09/02/21 1100 09/02/21 1200  BP: 93/68 106/77 98/80 116/64  Pulse: 100 (!) 105 (!) 103 94  Resp: (!) 21 (!) 21 (!) 36 (!) 24  Temp:      TempSrc:      SpO2: 99% 98% 100% 99%  Weight:      Height:       Weight change: -1.2 kg  Intake/Output Summary (Last 24 hours) at 09/02/2021 1319 Last data filed at 09/02/2021 1300 Gross per 24 hour  Intake 4093.53 ml  Output 6455 ml  Net -2361.47 ml    Net IO Since Admission: 773.48 mL [09/02/21 1319]   Physical Examination: General exam: AAOx2-3, ill looking frail  older than stated age, weak appearing. HEENT:Oral mucosa moist, Ear/Nose WNL grossly, dentition normal. Respiratory system: bilaterally clear no use of accessory muscle Cardiovascular system: S1 & S2 +, No JVD,. Gastrointestinal system: Abdomen soft,NT,ND, BS+ Nervous System:Alert, awake, moving extremities and grossly nonfocal Extremities: No edema, distal peripheral pulses palpable.  Skin: No rashes,no icterus. MSK: Normal muscle bulk,tone, power .  Medications reviewed:  Scheduled Meds:  Chlorhexidine Gluconate Cloth  6 each Topical Daily   collagenase   Topical Daily   feeding supplement  237 mL Oral TID BM   heparin  5,000 Units Subcutaneous Q8H   insulin aspart  0-9 Units Subcutaneous Q4H   mouth rinse  15 mL Mouth Rinse BID   midodrine  2.5 mg Oral TID WC   multivitamin with minerals  1 tablet Oral Daily   Continuous Infusions:  albumin human 25 g (09/02/21 1158)   dextrose 30 mL/hr at 09/02/21 1200   lactated ringers 100 mL/hr at 09/02/21 1200   piperacillin-tazobactam (ZOSYN)  IV 3.375 g (09/02/21 1214)   vancomycin Stopped (09/02/21 1130)   Pressure Injury 08/31/21 Thigh Anterior;Left;Proximal 4cm by 5 cm by 4 mm depth, slough present/ most likely from moisture no pressure due to locaiton (Active)  08/31/21 1630  Location: Thigh  Location Orientation: Anterior;Left;Proximal  Staging:   Wound Description (Comments): 4cm by 5 cm by 4 mm depth, slough present/ most likely from moisture no pressure  due to locaiton  Present on Admission: Yes     Pressure Injury 08/31/21 Sacrum Medial;Lower Unstageable - Full thickness tissue loss in which the base of the injury is covered by slough (yellow, tan, gray, green or brown) and/or eschar (tan, brown or black) in the wound bed. 9 cm by 6 cm (Active)  08/31/21 1630  Location: Sacrum  Location Orientation: Medial;Lower  Staging: Unstageable - Full thickness tissue loss in which the base of the injury is covered by slough (yellow,  tan, gray, green or brown) and/or eschar (tan, brown or black) in the wound bed.  Wound Description (Comments): 9 cm by 6 cm  Present on Admission: Yes    Diet Order             DIET - DYS 1 Room service appropriate? Yes; Fluid consistency: Thin  Diet effective now                 Weight change: -1.2 kg  Wt Readings from Last 3 Encounters:  09/02/21 55.5 kg  08/23/21 56.6 kg  08/09/21 54.4 kg    Consultants:see note  Procedures:see note Antimicrobials: Anti-infectives (From admission, onward)    Start     Dose/Rate Route Frequency Ordered Stop   09/02/21 1000  vancomycin (VANCOREADY) IVPB 1000 mg/200 mL        1,000 mg 200 mL/hr over 60 Minutes Intravenous Every 24 hours 09/01/21 1523     09/01/21 1030  vancomycin (VANCOREADY) IVPB 1250 mg/250 mL        1,250 mg 166.7 mL/hr over 90 Minutes Intravenous  Once 09/01/21 0931 09/01/21 1238   09/01/21 1000  piperacillin-tazobactam (ZOSYN) IVPB 3.375 g        3.375 g 12.5 mL/hr over 240 Minutes Intravenous Every 8 hours 09/01/21 0931     08/31/21 0100  vancomycin (VANCOCIN) IVPB 1000 mg/200 mL premix        1,000 mg 200 mL/hr over 60 Minutes Intravenous  Once 08/31/21 0056 08/31/21 0409   08/31/21 0100  ceFEPIme (MAXIPIME) 2 g in sodium chloride 0.9 % 100 mL IVPB        2 g 200 mL/hr over 30 Minutes Intravenous  Once 08/31/21 0056 08/31/21 0231      Culture/Microbiology    Component Value Date/Time   SDES BLOOD BLOOD LEFT HAND 09/01/2021 8250   SPECREQUEST  09/01/2021 0822    BOTTLES DRAWN AEROBIC AND ANAEROBIC Blood Culture adequate volume   CULT  09/01/2021 0822    NO GROWTH < 24 HOURS Performed at [facility], [address] [place], [place] [address]    REPTSTATUS PENDING 09/01/2021 7341    Other culture-see note  Unresulted Labs (From admission, onward)     Start     Ordered   09/03/21 9379  Basic metabolic panel  Daily,   R     Question:  Specimen collection method  Answer:  Unit=Unit collect    09/02/21 0837   09/01/21 0500  CBC  Daily,   STAT     Question:  Specimen collection method  Answer:  Lab=Lab collect   08/31/21 0240          Data Reviewed: I have personally reviewed following labs and imaging studies CBC: Recent Labs  Lab 08/30/21 2240 08/31/21 0517 09/01/21 0331 09/02/21 0331  WBC 10.6* 10.6* 15.2* 19.7*  HGB 12.4 10.6* 8.4* 7.8*  HCT 39.0 34.2* 26.2* 25.0*  MCV 93.3 94.7 94.2 93.6  PLT 195 170 161 146*  Basic Metabolic Panel: Recent Labs  Lab 08/30/21 1411 08/31/21 0517 08/31/21 2200 09/01/21 0331 09/01/21 0833 09/02/21 0331  NA 149* 151* 153* 151* 148* 159*  K 3.4* 2.9* 3.0*  --  3.6 3.2*  CL 107 114* 121*  --  118* 128*  CO2 22 27 26   --  25 24  GLUCOSE 104* 123* 146*  --  111* 99  BUN 39* 31* 19  --  11 6*  CREATININE 1.98* 1.01* 0.86  --  0.87 0.90  CALCIUM 9.3 8.6* 7.9*  --  7.8* 8.2*  MG  --   --   --  1.9  --  1.6*  PHOS  --   --  <1.0*  --  2.8 2.8    GFR: Estimated Creatinine Clearance: 49.9 mL/min (by C-G formula based on SCr of 0.9 mg/dL). Liver Function Tests: Recent Labs  Lab 08/31/21 0032 08/31/21 0517 08/31/21 2200 09/01/21 1126  AST 63* 50*  --  36  ALT 135* 119*  --  73*  ALKPHOS 293* 267*  --  192*  BILITOT 1.1 1.0  --  0.7  PROT 6.2* 5.9*  --  4.7*  ALBUMIN 3.0* 2.8* 2.4* 2.1*    No results for input(s): LIPASE, AMYLASE in the last 168 hours. No results for input(s): AMMONIA in the last 168 hours. Coagulation Profile: No results for input(s): INR, PROTIME in the last 168 hours. Cardiac Enzymes: Recent Labs  Lab 08/30/21 2240  CKTOTAL 27*    BNP (last 3 results) No results for input(s): PROBNP in the last 8760 hours. HbA1C: Recent Labs    09/01/21 0331  HGBA1C 7.4*   CBG: Recent Labs  Lab 09/01/21 1943 09/01/21 2321 09/02/21 0329 09/02/21 0738 09/02/21 1116  GLUCAP 140* 80 95 98 90    Lipid Profile: No results for input(s): CHOL, HDL, LDLCALC, TRIG, CHOLHDL, LDLDIRECT in the last  72 hours. Thyroid Function Tests: Recent Labs    08/31/21 0517  TSH 4.384    Anemia Panel: No results for input(s): VITAMINB12, FOLATE, FERRITIN, TIBC, IRON, RETICCTPCT in the last 72 hours. Sepsis Labs: Recent Labs  Lab 08/30/21 2240 09/01/21 0938 09/01/21 1126 09/01/21 1409 09/01/21 1504 09/02/21 0331  PROCALCITON 1.28  --   --   --   --  0.89  LATICACIDVEN 1.9 2.3* 3.4* 2.8* 2.9*  --      Recent Results (from the past 240 hour(s))  Resp Panel by RT-PCR (Flu A&B, Covid) Nasopharyngeal Swab     Status: None   Collection Time: 08/30/21 10:40 PM   Specimen: Nasopharyngeal Swab; Nasopharyngeal(NP) swabs in vial transport medium  Result Value Ref Range Status   SARS Coronavirus 2 by RT PCR NEGATIVE NEGATIVE Final    Comment: (NOTE) SARS-CoV-2 target nucleic acids are NOT DETECTED.  The SARS-CoV-2 RNA is generally detectable in upper respiratory specimens during the acute phase of infection. The lowest concentration of SARS-CoV-2 viral copies this assay can detect is 138 copies/mL. A negative result does not preclude SARS-Cov-2 infection and should not be used as the sole basis for treatment or other patient management decisions. A negative result may occur with  improper specimen collection/handling, submission of specimen other than nasopharyngeal swab, presence of viral mutation(s) within the areas targeted by this assay, and inadequate number of viral copies(<138 copies/mL). A negative result must be combined with clinical observations, patient history, and epidemiological information. The expected result is Negative.  Fact Sheet for Patients:  EntrepreneurPulse.com.au  Fact Sheet for Healthcare Providers:  IncredibleEmployment.be  This test is no t yet approved or cleared by the [place] and  has been authorized for detection and/or diagnosis of SARS-CoV-2 by FDA under an Emergency Use Authorization (EUA). This EUA  will remain  in effect (meaning this test can be used) for the duration of the COVID-19 declaration under Section 564(b)(1) of the Act, 21 U.S.C.section 360bbb-3(b)(1), unless the authorization is terminated  or revoked sooner.       Influenza A by PCR NEGATIVE NEGATIVE Final   Influenza B by PCR NEGATIVE NEGATIVE Final    Comment: (NOTE) The Xpert Xpress SARS-CoV-2/FLU/RSV plus assay is intended as an aid in the diagnosis of influenza from Nasopharyngeal swab specimens and should not be used as a sole basis for treatment. Nasal washings and aspirates are unacceptable for Xpert Xpress SARS-CoV-2/FLU/RSV testing.  Fact Sheet for Patients: EntrepreneurPulse.com.au  Fact Sheet for Healthcare Providers: IncredibleEmployment.be  This test is not yet approved or cleared by the [place] FDA and has been authorized for detection and/or diagnosis of SARS-CoV-2 by FDA under an Emergency Use Authorization (EUA). This EUA will remain in effect (meaning this test can be used) for the duration of the COVID-19 declaration under Section 564(b)(1) of the Act, 21 U.S.C. section 360bbb-3(b)(1), unless the authorization is terminated or revoked.  Performed at [facility], [address] [place], [place] [address]   Blood culture (routine x 2)     Status: None (Preliminary result)   Collection Time: 08/30/21 10:40 PM   Specimen: BLOOD  Result Value Ref Range Status   Specimen Description BLOOD RCHEST PORK  Final   Special Requests   Final    BOTTLES DRAWN AEROBIC AND ANAEROBIC Blood Culture adequate volume   Culture   Final    NO GROWTH 3 DAYS Performed at [facility], [address], [place], [place] [address]    Report Status PENDING  Incomplete  Urine Culture     Status: Abnormal   Collection Time: 08/31/21 12:32 AM   Specimen: Urine, Catheterized  Result Value Ref Range Status   Specimen Description   Final    URINE,  CATHETERIZED Performed at [facility], [address] [place], [place] [address]    Special Requests   Final    NONE Performed at [facility], [address] [place], [place] [address]    Culture (A)  Final    >=100,000 COLONIES/mL VIRIDANS STREPTOCOCCUS Standardized susceptibility testing for this organism is not available. Performed at [facility], [place] [place], [place] [address]    Report Status 09/01/2021 FINAL  Final  Culture, blood (Routine X 2) w Reflex to ID Panel     Status: None (Preliminary result)   Collection Time: 09/01/21  8:22 AM   Specimen: BLOOD  Result Value Ref Range Status   Specimen Description BLOOD BLOOD LEFT HAND  Final   Special Requests   Final    BOTTLES DRAWN AEROBIC AND ANAEROBIC Blood Culture adequate volume   Culture   Final    NO GROWTH < 24 HOURS Performed at [facility], [address] [place],  [address]    Report Status PENDING  Incomplete  C Difficile Quick Screen w PCR reflex     Status: None   Collection Time: 09/01/21  2:09 PM   Specimen: STOOL  Result Value Ref Range Status   C Diff antigen NEGATIVE NEGATIVE Final   C Diff toxin NEGATIVE NEGATIVE Final  C Diff interpretation No C. difficile detected.  Final    Comment: Performed at [facility], [address] [place], [place] [address]  Gastrointestinal Panel by PCR , Stool     Status: None   Collection Time: 09/01/21  2:09 PM   Specimen: Stool  Result Value Ref Range Status   Campylobacter species NOT DETECTED NOT DETECTED Final   Plesimonas shigelloides NOT DETECTED NOT DETECTED Final   Salmonella species NOT DETECTED NOT DETECTED Final   Yersinia enterocolitica NOT DETECTED NOT DETECTED Final   Vibrio species NOT DETECTED NOT DETECTED Final   Vibrio cholerae NOT DETECTED NOT DETECTED Final   Enteroaggregative E coli (EAEC) NOT DETECTED NOT DETECTED Final   Enteropathogenic E coli (EPEC) NOT DETECTED NOT  DETECTED Final   Enterotoxigenic E coli (ETEC) NOT DETECTED NOT DETECTED Final   Shiga like toxin producing E coli (STEC) NOT DETECTED NOT DETECTED Final   Shigella/Enteroinvasive E coli (EIEC) NOT DETECTED NOT DETECTED Final   Cryptosporidium NOT DETECTED NOT DETECTED Final   Cyclospora cayetanensis NOT DETECTED NOT DETECTED Final   Entamoeba histolytica NOT DETECTED NOT DETECTED Final   Giardia lamblia NOT DETECTED NOT DETECTED Final   Adenovirus F40/41 NOT DETECTED NOT DETECTED Final   Astrovirus NOT DETECTED NOT DETECTED Final   Norovirus GI/GII NOT DETECTED NOT DETECTED Final   Rotavirus A NOT DETECTED NOT DETECTED Final   Sapovirus (I, II, IV, and V) NOT DETECTED NOT DETECTED Final    Comment: Performed at [facility], [address] [place], [place] [address]  MRSA Next Gen by PCR, Nasal     Status: None   Collection Time: 09/01/21  3:03 PM   Specimen: Nasal Mucosa; Nasal Swab  Result Value Ref Range Status   MRSA by PCR Next Gen NOT DETECTED NOT DETECTED Final    Comment: (NOTE) The GeneXpert MRSA Assay (FDA approved for NASAL specimens only), is one component of a comprehensive MRSA colonization surveillance program. It is not intended to diagnose MRSA infection nor to guide or monitor treatment for MRSA infections. Test performance is not FDA approved in patients less than 36 years old. Performed at [facility], [address], [place], [place] [address]       Radiology Studies: No results found.   LOS: 2 days   Antonieta Pert, MD [facility]  09/02/2021, 1:19 PM

## 2021-09-03 LAB — GLUCOSE, CAPILLARY
Glucose-Capillary: 92 mg/dL (ref 70–99)
Glucose-Capillary: 113 mg/dL — ABNORMAL HIGH (ref 70–99)
Glucose-Capillary: 102 mg/dL — ABNORMAL HIGH (ref 70–99)
Glucose-Capillary: 107 mg/dL — ABNORMAL HIGH (ref 70–99)

## 2021-09-03 LAB — BASIC METABOLIC PANEL
BUN: 6 mg/dL — ABNORMAL LOW (ref 8–23)
CO2: 25 mmol/L (ref 22–32)
Calcium: 9.1 mg/dL (ref 8.9–10.3)
Chloride: >130 mmol/L (ref 98–111)
Creatinine, Ser: 0.96 mg/dL (ref 0.44–1.00)
GFR, Estimated: >60 mL/min (ref >60)
Glucose, Bld: 132 mg/dL — ABNORMAL HIGH (ref 70–99)
Potassium: 3.3 mmol/L — ABNORMAL LOW (ref 3.5–5.1)
Sodium: 160 mmol/L — ABNORMAL HIGH (ref 135–145)

## 2021-09-03 LAB — CBC
HCT: 28.2 % — ABNORMAL LOW (ref 36.0–46.0)
Hemoglobin: 8.7 g/dL — ABNORMAL LOW (ref 12.0–15.0)
MCH: 29.7 pg (ref 26.0–34.0)
MCHC: 30.9 g/dL (ref 30.0–36.0)
MCV: 96.2 fL (ref 80.0–100.0)
Platelets: 140 10*3/uL — ABNORMAL LOW (ref 150–400)
RBC: 2.93 MIL/uL — ABNORMAL LOW (ref 3.87–5.11)
RDW: 22 % — ABNORMAL HIGH (ref 11.5–15.5)
WBC: 16.5 10*3/uL — ABNORMAL HIGH (ref 4.0–10.5)
nRBC: 2.2 % — ABNORMAL HIGH (ref 0.0–0.2)

## 2021-09-03 LAB — MAGNESIUM: Magnesium: 2.8 mg/dL — ABNORMAL HIGH (ref 1.7–2.4)

## 2021-09-03 LAB — SODIUM: Sodium: 157 mmol/L — ABNORMAL HIGH (ref 135–145)

## 2021-09-03 LAB — PHOSPHORUS: Phosphorus: 2.2 mg/dL — ABNORMAL LOW (ref 2.5–4.6)

## 2021-09-03 MED ORDER — POTASSIUM CHLORIDE 10 MEQ/50ML IV SOLN
10.0000 meq | INTRAVENOUS | Status: AC
  Administered 2021-09-03 (×3): 10 meq via INTRAVENOUS
  Filled 2021-09-03 (×3): qty 50

## 2021-09-03 MED ORDER — POTASSIUM CHLORIDE CRYS ER 20 MEQ PO TBCR
20.0000 meq | EXTENDED_RELEASE_TABLET | Freq: Once | ORAL | Status: AC
  Administered 2021-09-03: 22:00:00 20 meq via ORAL
  Filled 2021-09-03: qty 1

## 2021-09-03 MED ORDER — DEXTROSE 5 % IV SOLN: INTRAVENOUS | Status: DC

## 2021-09-03 MED ORDER — K PHOS MONO-SOD PHOS DI & MONO 155-852-130 MG PO TABS
500.0000 mg | ORAL_TABLET | ORAL | Status: AC
  Administered 2021-09-03 (×2): 500 mg via ORAL
  Filled 2021-09-03 (×3): qty 2

## 2021-09-03 NOTE — Progress Notes (Signed)
CRITICAL CARE PROGRESS NOTE    Name: Sandra Jordan MRN: [id number] DOB: 1951-01-04     LOS: 3   SUBJECTIVE FINDINGS & SIGNIFICANT EVENTS    Patient description:   This is a 70 yo F with hx of stage 4 lung cancer with brain metastasis, failure to thrive, found home covered in feces.    I met with family and had goals of care conference, nephew and niece are present and share patient is currently full code. She resides her brother who has psychiatric diagnosis on therapy and patient cares for him medically due to his psychiatric illness. They have met with palliative already and have additional meetings planned at some point.   Additional Data from admission report including:   labs showed hyponatremia, hypokalemia AKI creatinine 1.9 anion gap 20, Pro-Cal 1.2 lactic acid 1.9. COVID-19 present negative, urine/blood cultures sent.  CT head no acute finding, chest x-ray significantly improved aeration of the right upper lobe although persistent infiltrate remained.  Ultrasound 07/27/2021 echogenic liver with hepatic steatosis, S/P cholecystectomy Patient received vancomycin and cefepime 11/24 AM in ED.   09/02/21- patient improved post IVF and abx for UTI. She is more lucid and communicative. She is off vasopressors now.  She is DNR Galvin Proffer.  O2 is on room air.   09/03/21- patient is stable , reviewed medical plan with Dr Lupita Leash.  Shock physiology resolved, there is ogoing loose stools.    Lines/tubes : Implanted Port 07/24/21 Right Chest (Active)  Site Assessment Clean;Dry;Intact 08/31/21 2100  Port Intervention Flushed;Deaccessed - Gauze Dressing Placed 08/23/21 1504  Needle Size H 20 gauge 08/31/21 1630  Line Status Infusing 08/31/21 2100  Line Care Tubing changed;Connections checked and tightened 08/31/21 2100   Dressing Type Transparent 08/31/21 2100  Dressing Status Clean;Dry;Intact 08/31/21 2100  Antimicrobial disc in place? Yes 08/31/21 2100  Dressing Intervention New dressing 08/23/21 0824  Needle Change Due 09/06/21 08/31/21 2100     Urethral Catheter Jun, RN Non-latex 14 Fr. (Active)  Indication for Insertion or Continuance of Catheter Healing of stage 3 or 4 pressure injuries AND incontinence 09/01/21 1027  Site Assessment Clean;Intact;Dry 09/01/21 1027  Catheter Maintenance Bag below level of bladder;Catheter secured;Drainage bag/tubing not touching floor;Insertion date on drainage bag;No dependent loops;Seal intact;Bag emptied prior to transport 09/01/21 1027  Collection Container Standard drainage bag 09/01/21 1027  Securement Method Securing device (Describe) 09/01/21 1027  Output (mL) 380 mL 09/01/21 0338    Microbiology/Sepsis markers: Results for orders placed or performed during the hospital encounter of 08/30/21  Resp Panel by RT-PCR (Flu A&B, Covid) Nasopharyngeal Swab     Status: None   Collection Time: 08/30/21 10:40 PM   Specimen: Nasopharyngeal Swab; Nasopharyngeal(NP) swabs in vial transport medium  Result Value Ref Range Status   SARS Coronavirus 2 by RT PCR NEGATIVE NEGATIVE Final    Comment: (NOTE) SARS-CoV-2 target nucleic acids are NOT DETECTED.  The SARS-CoV-2 RNA is generally detectable in upper respiratory specimens during the acute phase of infection. The lowest concentration of SARS-CoV-2 viral copies this assay can detect is 138 copies/mL. A negative result does not preclude SARS-Cov-2 infection and should not be used as the sole basis for treatment or other patient management decisions. A negative result may occur with  improper specimen collection/handling, submission of specimen other than nasopharyngeal swab, presence of viral mutation(s) within the areas targeted by this assay, and inadequate number of viral copies(<138 copies/mL). A negative result  must be combined with clinical observations, patient history,  and epidemiological information. The expected result is Negative.  Fact Sheet for Patients:  EntrepreneurPulse.com.au  Fact Sheet for Healthcare Providers:  IncredibleEmployment.be  This test is no t yet approved or cleared by the [place] FDA and  has been authorized for detection and/or diagnosis of SARS-CoV-2 by FDA under an Emergency Use Authorization (EUA). This EUA will remain  in effect (meaning this test can be used) for the duration of the COVID-19 declaration under Section 564(b)(1) of the Act, 21 U.S.C.section 360bbb-3(b)(1), unless the authorization is terminated  or revoked sooner.       Influenza A by PCR NEGATIVE NEGATIVE Final   Influenza B by PCR NEGATIVE NEGATIVE Final    Comment: (NOTE) The Xpert Xpress SARS-CoV-2/FLU/RSV plus assay is intended as an aid in the diagnosis of influenza from Nasopharyngeal swab specimens and should not be used as a sole basis for treatment. Nasal washings and aspirates are unacceptable for Xpert Xpress SARS-CoV-2/FLU/RSV testing.  Fact Sheet for Patients: EntrepreneurPulse.com.au  Fact Sheet for Healthcare Providers: IncredibleEmployment.be  This test is not yet approved or cleared by the [place] FDA and has been authorized for detection and/or diagnosis of SARS-CoV-2 by FDA under an Emergency Use Authorization (EUA). This EUA will remain in effect (meaning this test can be used) for the duration of the COVID-19 declaration under Section 564(b)(1) of the Act, 21 U.S.C. section 360bbb-3(b)(1), unless the authorization is terminated or revoked.  Performed at [facility], [address] [place], [place] [address]   Blood culture (routine x 2)     Status: None (Preliminary result)   Collection Time: 08/30/21 10:40 PM   Specimen: BLOOD  Result Value Ref Range Status    Specimen Description BLOOD RCHEST PORK  Final   Special Requests   Final    BOTTLES DRAWN AEROBIC AND ANAEROBIC Blood Culture adequate volume   Culture   Final    NO GROWTH 4 DAYS Performed at [facility], [address] [place], [place] [address]    Report Status PENDING  Incomplete  Urine Culture     Status: Abnormal   Collection Time: 08/31/21 12:32 AM   Specimen: Urine, Catheterized  Result Value Ref Range Status   Specimen Description   Final    URINE, CATHETERIZED Performed at [facility], [address] [place], [place] [address]    Special Requests   Final    NONE Performed at [facility], [address], [place] [address]    Culture (A)  Final    >=100,000 COLONIES/mL VIRIDANS STREPTOCOCCUS Standardized susceptibility testing for this organism is not available. Performed at [facility], [address], [place], [place] [address]    Report Status 09/01/2021 FINAL  Final  Culture, blood (Routine X 2) w Reflex to ID Panel     Status: None (Preliminary result)   Collection Time: 09/01/21  8:22 AM   Specimen: BLOOD  Result Value Ref Range Status   Specimen Description BLOOD BLOOD LEFT HAND  Final   Special Requests   Final    BOTTLES DRAWN AEROBIC AND ANAEROBIC Blood Culture adequate volume   Culture   Final    NO GROWTH 2 DAYS Performed at [facility], [address], [place], [place] [address]    Report Status PENDING  Incomplete  C Difficile Quick Screen w PCR reflex     Status: None   Collection Time: 09/01/21  2:09 PM   Specimen: STOOL  Result Value  Ref Range Status   C Diff antigen NEGATIVE NEGATIVE Final   C Diff toxin NEGATIVE NEGATIVE Final   C Diff interpretation No C. difficile detected.  Final    Comment: Performed at [facility], [address] [place], [place] [address]  Gastrointestinal Panel by PCR , Stool     Status: None   Collection Time: 09/01/21  2:09 PM   Specimen: Stool   Result Value Ref Range Status   Campylobacter species NOT DETECTED NOT DETECTED Final   Plesimonas shigelloides NOT DETECTED NOT DETECTED Final   Salmonella species NOT DETECTED NOT DETECTED Final   Yersinia enterocolitica NOT DETECTED NOT DETECTED Final   Vibrio species NOT DETECTED NOT DETECTED Final   Vibrio cholerae NOT DETECTED NOT DETECTED Final   Enteroaggregative E coli (EAEC) NOT DETECTED NOT DETECTED Final   Enteropathogenic E coli (EPEC) NOT DETECTED NOT DETECTED Final   Enterotoxigenic E coli (ETEC) NOT DETECTED NOT DETECTED Final   Shiga like toxin producing E coli (STEC) NOT DETECTED NOT DETECTED Final   Shigella/Enteroinvasive E coli (EIEC) NOT DETECTED NOT DETECTED Final   Cryptosporidium NOT DETECTED NOT DETECTED Final   Cyclospora cayetanensis NOT DETECTED NOT DETECTED Final   Entamoeba histolytica NOT DETECTED NOT DETECTED Final   Giardia lamblia NOT DETECTED NOT DETECTED Final   Adenovirus F40/41 NOT DETECTED NOT DETECTED Final   Astrovirus NOT DETECTED NOT DETECTED Final   Norovirus GI/GII NOT DETECTED NOT DETECTED Final   Rotavirus A NOT DETECTED NOT DETECTED Final   Sapovirus (I, II, IV, and V) NOT DETECTED NOT DETECTED Final    Comment: Performed at [facility], [address] [place], [place] [address]  MRSA Next Gen by PCR, Nasal     Status: None   Collection Time: 09/01/21  3:03 PM   Specimen: Nasal Mucosa; Nasal Swab  Result Value Ref Range Status   MRSA by PCR Next Gen NOT DETECTED NOT DETECTED Final    Comment: (NOTE) The GeneXpert MRSA Assay (FDA approved for NASAL specimens only), is one component of a comprehensive MRSA colonization surveillance program. It is not intended to diagnose MRSA infection nor to guide or monitor treatment for MRSA infections. Test performance is not FDA approved in patients less than 64 years old. Performed at [facility], [address] [place], [place] [address]     Anti-infectives:   Anti-infectives (From admission, onward)    Start     Dose/Rate Route Frequency Ordered Stop   09/02/21 1800  cefTRIAXone (ROCEPHIN) 2 g in sodium chloride 0.9 % 100 mL IVPB        2 g 200 mL/hr over 30 Minutes Intravenous Every 24 hours 09/02/21 1330     09/02/21 1000  vancomycin (VANCOREADY) IVPB 1000 mg/200 mL  Status:  Discontinued        1,000 mg 200 mL/hr over 60 Minutes Intravenous Every 24 hours 09/01/21 1523 09/02/21 1330   09/01/21 1030  vancomycin (VANCOREADY) IVPB 1250 mg/250 mL        1,250 mg 166.7 mL/hr over 90 Minutes Intravenous  Once 09/01/21 0931 09/01/21 1238   09/01/21 1000  piperacillin-tazobactam (ZOSYN) IVPB 3.375 g  Status:  Discontinued        3.375 g 12.5 mL/hr over 240 Minutes Intravenous Every 8 hours 09/01/21 0931 09/02/21 1330   08/31/21 0100  vancomycin (VANCOCIN) IVPB 1000 mg/200 mL premix        1,000 mg 200 mL/hr over 60 Minutes Intravenous  Once 08/31/21  5366 08/31/21 0409   08/31/21 0100  ceFEPIme (MAXIPIME) 2 g in sodium chloride 0.9 % 100 mL IVPB        2 g 200 mL/hr over 30 Minutes Intravenous  Once 08/31/21 0056 08/31/21 0231        PAST MEDICAL HISTORY   Past Medical History:  Diagnosis Date   Allergy    Colon polyp    Hyperlipidemia    Hypertension    Lung cancer ([place])      SURGICAL HISTORY   Past Surgical History:  Procedure Laterality Date   ABDOMINAL HYSTERECTOMY  1984   BREAST BIOPSY Bilateral 1990   fibrocystic breast   BREAST BIOPSY Right August 2014   fibrocystic breast   CHOLECYSTECTOMY  2008   COLONOSCOPY  2012, 2017   [facility]   IR CV LINE INJECTION  07/07/2021   IR IMAGING GUIDED PORT INSERTION  06/20/2021   IR IMAGING GUIDED PORT INSERTION  07/24/2021     FAMILY HISTORY   Family History  Problem Relation Age of Onset   Lung cancer Mother    Colon cancer Father 17   Prostate cancer Father    Lung cancer Sister    Lung cancer Maternal Uncle    Stomach cancer Maternal Grandfather    Breast cancer Niece       SOCIAL HISTORY   Social History   Tobacco Use   Smoking status: Former    Years: 15.00    Types: Cigarettes    Quit date: 10/08/2004    Years since quitting: 16.9   Smokeless tobacco: Never  Substance Use Topics   Alcohol use: No    Alcohol/week: 0.0 standard drinks   Drug use: No     MEDICATIONS   Current Medication:  Current Facility-Administered Medications:    acetaminophen (TYLENOL) tablet 650 mg, 650 mg, Oral, Q6H PRN **OR** acetaminophen (TYLENOL) suppository 650 mg, 650 mg, Rectal, Q6H PRN, Florina Ou V, MD, 650 mg at 09/01/21 1602   cefTRIAXone (ROCEPHIN) 2 g in sodium chloride 0.9 % 100 mL IVPB, 2 g, Intravenous, Q24H, Kc, Ramesh, MD, Stopped at 09/02/21 1739   Chlorhexidine Gluconate Cloth 2 % PADS 6 each, 6 each, Topical, Daily, Para Skeans, MD, 6 each at 09/03/21 1025   collagenase (SANTYL) ointment, , Topical, Daily, Kc, Ramesh, MD, 1 application at 44/03/47 1025   dextrose 5 % solution, , Intravenous, Continuous, Kc, Ramesh, MD, Last Rate: 50 mL/hr at 09/03/21 1024, New Bag at 09/03/21 1024   feeding supplement (ENSURE ENLIVE / ENSURE PLUS) liquid 237 mL, 237 mL, Oral, TID BM, Kc, Ramesh, MD, 237 mL at 09/02/21 1033   heparin injection 5,000 Units, 5,000 Units, Subcutaneous, Q8H, Florina Ou V, MD, 5,000 Units at 09/03/21 0512   insulin aspart (novoLOG) injection 0-9 Units, 0-9 Units, Subcutaneous, Q4H, Rust-Chester, Britton L, NP, 1 Units at 09/03/21 0335   MEDLINE mouth rinse, 15 mL, Mouth Rinse, BID, Rust-Chester, Britton L, NP, 15 mL at 09/03/21 1029   midodrine (PROAMATINE) tablet 2.5 mg, 2.5 mg, Oral, TID WC, Eun Vermeer, MD, 2.5 mg at 09/03/21 1011   multivitamin with minerals tablet 1 tablet, 1 tablet, Oral, Daily, Kc, Ramesh, MD, 1 tablet at 09/03/21 1025   phosphorus (K PHOS NEUTRAL) tablet 500 mg, 500 mg, Oral, Q4H, Oswald Hillock, RPH, 500 mg at 09/03/21 1031   potassium chloride SA (KLOR-CON) CR tablet 20 mEq, 20 mEq, Oral, Once, Posey Pronto,  Kishan S, RPH  Facility-Administered Medications Ordered in Other Encounters:  heparin lock flush 100 unit/mL, 500 Units, Intravenous, Once, Borders, Kirt Boys, NP    ALLERGIES   Patient has no known allergies.    REVIEW OF SYSTEMS     Unable to obtain due to confusion  PHYSICAL EXAMINATION   Vital Signs: Temp:  [97.9 F (36.6 C)-98.8 F (37.1 C)] 98.8 F (37.1 C) (11/27 0200) Pulse Rate:  [83-101] 84 (11/27 0600) Resp:  [13-28] 16 (11/27 0600) BP: (83-124)/(56-101) 124/60 (11/27 0600) SpO2:  [96 %-100 %] 98 % (11/27 0600) Weight:  [53.3 kg] 53.3 kg (11/27 0111)  GENERAL:Age appropriate chronically ill appearing HEAD: Normocephalic, atraumatic.  EYES: Pupils equal, round, reactive to light.  No scleral icterus.  MOUTH: Moist mucosal membrane. NECK: Supple. No thyromegaly. No nodules. No JVD.  PULMONARY: mild rhonchi b/l CARDIOVASCULAR: S1 and S2. Regular rate and rhythm. No murmurs, rubs, or gallops.  GASTROINTESTINAL: Soft, nontender, non-distended. No masses. Positive bowel sounds. No hepatosplenomegaly.  MUSCULOSKELETAL: No swelling, clubbing, or edema.  NEUROLOGIC: mildly confused with NO FND grossly SKIN:intact,warm,dry   PERTINENT DATA     Infusions:  cefTRIAXone (ROCEPHIN)  IV Stopped (09/02/21 1739)   dextrose 50 mL/hr at 09/03/21 1024   Scheduled Medications:  Chlorhexidine Gluconate Cloth  6 each Topical Daily   collagenase   Topical Daily   feeding supplement  237 mL Oral TID BM   heparin  5,000 Units Subcutaneous Q8H   insulin aspart  0-9 Units Subcutaneous Q4H   mouth rinse  15 mL Mouth Rinse BID   midodrine  2.5 mg Oral TID WC   multivitamin with minerals  1 tablet Oral Daily   phosphorus  500 mg Oral Q4H   potassium chloride  20 mEq Oral Once   PRN Medications: acetaminophen **OR** acetaminophen Hemodynamic parameters:   Intake/Output: 11/26 0701 - 11/27 0700 In: 3650.3 [I.V.:2546.3; IV [id number]] Out: 3005 [Urine:2925;  Stool:750]  Ventilator  Settings:    LAB RESULTS:  Basic Metabolic Panel: Recent Labs  Lab 08/31/21 0517 08/31/21 2200 09/01/21 0331 09/01/21 0833 09/02/21 0331 09/02/21 1930 09/03/21 0337  NA 151* 153* 151* 148* 159*  --  160*  K 2.9* 3.0*  --  3.6 3.2* 3.5 3.3*  CL 114* 121*  --  118* 128*  --  >130*  CO2 27 26  --  25 24  --  25  GLUCOSE 123* 146*  --  111* 99  --  132*  BUN 31* 19  --  11 6*  --  6*  CREATININE 1.01* 0.86  --  0.87 0.90  --  0.96  CALCIUM 8.6* 7.9*  --  7.8* 8.2*  --  9.1  MG  --   --  1.9  --  1.6*  --  2.8*  PHOS  --  <1.0*  --  2.8 2.8  --  2.2*    Liver Function Tests: Recent Labs  Lab 08/31/21 0032 08/31/21 0517 08/31/21 2200 09/01/21 1126  AST 63* 50*  --  36  ALT 135* 119*  --  73*  ALKPHOS 293* 267*  --  192*  BILITOT 1.1 1.0  --  0.7  PROT 6.2* 5.9*  --  4.7*  ALBUMIN 3.0* 2.8* 2.4* 2.1*    No results for input(s): LIPASE, AMYLASE in the last 168 hours. No results for input(s): AMMONIA in the last 168 hours. CBC: Recent Labs  Lab 08/30/21 2240 08/31/21 0517 09/01/21 0331 09/02/21 0331 09/03/21 0337  WBC 10.6* 10.6* 15.2* 19.7* 16.5*  HGB 12.4 10.6* 8.4*  7.8* 8.7*  HCT 39.0 34.2* 26.2* 25.0* 28.2*  MCV 93.3 94.7 94.2 93.6 96.2  PLT 195 170 161 146* 140*    Cardiac Enzymes: Recent Labs  Lab 08/30/21 2240  CKTOTAL 27*    BNP: Invalid input(s): POCBNP CBG: Recent Labs  Lab 09/02/21 1553 09/02/21 1938 09/02/21 2322 09/03/21 0744 09/03/21 1118  GLUCAP 95 100* 105* 92 113*        IMAGING RESULTS:  Imaging: No results found. '@PROBHOSP' @ No results found.             IMPRESSION: 1. Dominant right upper lobe mass with truncation of the right upper lobe bronchus, with extensive associated thoracic adenopathy as well as intracranial metastatic lesions. 2. A 2.3 by 1.9 cm somewhat irregular right lower lobe nodule could represent a metastatic lesion or synchronous low-grade adenocarcinoma, but  has only low-grade activity with maximum SUV of 1.4. The other tiny pulmonary nodules scattered in the right lung are too small to characterize. 3. No compelling findings of metastatic involvement of the abdomen/pelvis or visualized skeleton. 4. Other imaging findings of potential clinical significance: Aortic Atherosclerosis (ICD10-I70.0). Nonobstructive left nephrolithiasis. Scattered small type 1 hiatal hernia.   ASSESSMENT AND PLAN    -Multidisciplinary rounds held today  Acute Hypoxic Respiratory Failure -patient requiring 3L/min  -post obstructive RUL Adenocarcinoma tumor with right mainstem lesion mass effect as noted in CT above -continue Bronchodilator Therapy   Goals of care conference    Code status :DNR /DNI   Septic shock -present on admission -urinary tract infection          Strep viridans     On antibiotics-vanc/zosyn empirically -use vasopressors to keep MAP>65 -follow ABG and LA -follow up cultures -emperic ABX    Altered Mental status with encephalopathy - suspect due to septic encephalopathy and due to progressive metastatic brain cancer   ID -continue IV abx as prescibed -follow up cultures  GI/Nutrition GI PROPHYLAXIS as indicated DIET-->TF's as tolerated Constipation protocol as indicated  ENDO - ICU hypoglycemic\Hyperglycemia protocol -check FSBS per protocol   ELECTROLYTES -follow labs as needed -replace as needed -pharmacy consultation   DVT/GI PRX ordered -SCDs  TRANSFUSIONS AS NEEDED MONITOR FSBS ASSESS the need for LABS as needed   Critical care provider statement:   Total critical care time: 109 minutes   Performed by: Lanney Gins MD   Critical care time was exclusive of separately billable procedures and treating other patients.   Critical care was necessary to treat or prevent imminent or life-threatening deterioration.   Critical care was time spent personally by me on the following activities: development of  treatment plan with patient and/or surrogate as well as nursing, discussions with consultants, evaluation of patient's response to treatment, examination of patient, obtaining history from patient or surrogate, ordering and performing treatments and interventions, ordering and review of laboratory studies, ordering and review of radiographic studies, pulse oximetry and re-evaluation of patient's condition.    Ottie Glazier, M.D.  Pulmonary & Critical Care Medicine       Ottie Glazier, M.D.  Division of [facility]

## 2021-09-03 NOTE — Progress Notes (Signed)
Chaplain Maggie visited with family in ICU waiting room to offer ministry of presence and prayer. Chaplain also visited with pt at bedside to introduce pastoral care. Continued spiritual, social and emotional support available per on call chaplain.

## 2021-09-03 NOTE — Progress Notes (Signed)
PHARMACY CONSULT NOTE - FOLLOW UP  Pharmacy Consult for Electrolyte Monitoring and Replacement   Recent Labs: Potassium (mmol/L)  Date Value  09/03/2021 3.3 (L)  01/26/2013 3.3 (L)   Magnesium (mg/dL)  Date Value  09/03/2021 2.8 (H)  01/26/2013 2.1   Calcium (mg/dL)  Date Value  09/03/2021 9.1   Calcium, Total (mg/dL)  Date Value  01/26/2013 9.5   Albumin (g/dL)  Date Value  09/01/2021 2.1 (L)   Phosphorus (mg/dL)  Date Value  09/03/2021 2.2 (L)   Sodium (mmol/L)  Date Value  09/03/2021 160 (H)  01/26/2013 139   Corr Ca: 9.72 mg/dL  Assessment: 70 y.o. female admitted on 08/30/2021 with lethargy, weakness and dysuria. Pharmacy has been consulted for electrolyte replacement.   Goal of Therapy:  Electrolytes WNL  Plan:  Medical team ordered Kcl 10 mEq x 3 IV and will give Kcl PO 20 mEq in the PM.  Will give Kphos 2 tabs x 2.  Recheck electrolytes with AM labs  Oswald Hillock ,PharmD Clinical Pharmacist 09/03/2021 8:21 AM

## 2021-09-03 NOTE — Progress Notes (Signed)
PROGRESS NOTE    Sandra Jordan  HKV:[id number] DOB: 18-Nov-1950 DOA: 08/30/2021 PCP: Marinda Elk, MD   Chief Complaint  Patient presents with   Weakness  Brief Narrative/Hospital Course: Sandra Jordan, 70 y.o. 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  She lives with her brother.  Then nephew brought the patient to the hospital  She was seen in oncology clinic on 10/26, had completed whole brain XRT, PET from September 14 showed multiple lung mets but no evidence of systemic disease other than patient's known lesion in her brain, oncology proceeded with carboplatinum Taxol and Avastin treatment reduced due to liver dysfunction.  In the ED labs showed hyponatremia, hypokalemia AKI creatinine 1.9 anion gap 20, Pro-Cal 1.2 lactic acid 1.9. COVID-19 present negative, urine/blood cultures sent.  CT head no acute finding, chest x-ray significantly improved aeration of the right upper lobe although persistent infiltrate remained.  Ultrasound 07/27/2021 echogenic liver with hepatic steatosis, S/P cholecystectomy. Patient continues to remain weak frail encephalopathy and also started to have a soft blood pressure placed on aggressive IV fluid boluses 2 L, trended lactic acid and transferred to ICU in case patient needs pressors given severe sepsis. Culture data came back positive for streptococcal UTI blood culture negative palliative care was consulted given her declining condition and seen by pulmonary critical care. Patient/family elected to DNR/DNI and keep her comfortable  Subjective: Seen examined,  niece at bedside Niece asking to clarify that she is not on comfort measure- informed she is DNR/DNI only as of now. Pt is lethargic, sleepy- not communicating- briefly opened eys and back to sleep. Overnight T-max 98.8 blood pressure mostly in 90s to low 100, heart rate in 80s saturating well on room air.  Labs showed further trending sodium chloride level potassium low bicarb stable leukocytosis downtrending, hemoglobin 7.8> 8.7 g Good urine output on Foley 2.9 2 L, stool output 750 mL in rectal Tube down from 1050 ml..  Assessment & Plan:  Severe sepsis POA from UTI  Hypotension-multifactorial from sepsis, diarrhea, poor intake Hemodynamically stable although blood pressure soft at times.  Strep viridans in urine culture.UA wbc 6-10 Nitrite neg,le mod. Blood cx no growth so far. Antibiotic de-escalated to ceftriaxone 11/26.  WBC, procal downtrending .appreciate ICU management in stepdown unit. Recent Labs  Lab 08/30/21 2240 08/31/21 0517 09/01/21 0331 09/01/21 0938 09/01/21 1126 09/01/21 1409 09/01/21 1504 09/02/21 0331 09/03/21 0337  WBC 10.6* 10.6* 15.2*  --   --   --   --  19.7* 16.5*  LATICACIDVEN 1.9  --   --  2.3* 3.4* 2.8* 2.9*  --   --   PROCALCITON 1.28  --   --   --   --   --   --  0.89  --     Acute metabolic encephalopathy:  due to sepsis dehydration deconditioning as well as brain mets  She was found confused was found on feces lying in the bed, CT head no acute finding, likely multifactorial. Pt is lethargic, sleepy- not communicating- briefly opened eys and back to sleep.Continue supportive care schedule more alert awake.  Overall prognosis remains guarded  at this time.  Acute Hypoxic respiratory failure: In the setting of her lung cancer postobstructive changes. currently doing well on room air.  Adult failure to thrive:In the setting of metastatic cancer on chemotherapy, poor oral intake:Augment diet as tolerated, RD consult palliative care consulted.PTOT eval as  able  Hypernatremia Dehydration volume depletion: Sodium uptrending- change to D5W, D/C RL. Due to poor intake and free water deficit. Recent Labs  Lab 08/31/21 2200 09/01/21 0331 09/01/21 0833 09/02/21 0331 09/03/21 0337  NA 153* 151* 148* 159* 160*    Hypokalemia/hypophosphatemia/hypomagnesemia: K being replaced. Monitor Recent Labs  Lab 08/31/21 2200 09/01/21 0833 09/02/21 0331 09/02/21 1930 09/03/21 0337  K 3.0* 3.6 3.2* 3.5 3.3*    Loose stool/diarrhea patient was covered in feces at home.  Negative GI panel and C. Difficile.  Cont on Rectal tube, supportive care. Diarrhea slowing down it seems. Add imodium if abel to take po.  PNT:IRWE poor oral intake/ dehydration improved with IV fluids.   Transaminitis: AST improved to 36 ALT still limited 43.  [place] Had cholecystectomy in previous ultrasound.  Stage 4 right upper lung adenocarcinoma with innumerable mets to brain: has had whole brain radiation in October, on carboplatin Taxol and Avastin currently followed by outpatient oncology. Last chemo Wednesday, gets q3 wks. wondering if mass is having local compression effect on the airway as well as esophagus causing dysphagia difficult to with intake  Anemia of chronic disease hemoglobin 8.4 g drop from 10.6 likely hemoconcentrated.  Also recent chemo use.  Transfuse if less than 7 g.  Monitor Recent Labs  Lab 08/30/21 2240 08/31/21 0517 09/01/21 0331 09/02/21 0331 09/03/21 0337  HGB 12.4 10.6* 8.4* 7.8* 8.7*  HCT 39.0 34.2* 26.2* 25.0* 28.2*    Urine retention Foley inserted 11/24 with 1.5 L urine out immediately.  Continue the same.  Pressure injury sacrum and left thigh: POA wound care following continue wound dressing and local care.  Hypoglycemia: In the setting of poor oral intake sepsis. On D10 infusion- wean to d5w.  Recent Labs  Lab 09/02/21 1116 09/02/21 1553 09/02/21 1938 09/02/21 2322 09/03/21 0744  GLUCAP 90 95 100* 105* 92   Goals of care:  was full code.  Palliative meeting held and patient has been transitioned to DNR/DNI.  Family would like to keep the patient comfortable but continue on current treatment.  Palliative will follow on Monday. Discussed with pulmonary team.  Code Status:   Code Status: DNR Family Communication: plan of care discussed with patient's nephew previously-, niece at the bedside again today Status is: Inpatient Remains inpatient appropriate because: Remains inpatient for ongoing management of hypertension failure to thrive electrolyte imbalance Disposition: Currently not medically stable for discharge. Anticipated Disposition: TBD]  Objective: Vitals last 24 hrs: Vitals:   09/03/21 0300 09/03/21 0400 09/03/21 0500 09/03/21 0600  BP: 108/72 (!) 109/59 97/83 124/60  Pulse: 85 83 86 84  Resp: 17 (!) 23 20 16   Temp:      TempSrc:      SpO2: 99% 98% 98% 98%  Weight:      Height:       Weight change: -2.2 kg  Intake/Output Summary (Last 24 hours) at 09/03/2021 1004 Last data filed at 09/03/2021 0200 Gross per 24 hour  Intake 2007.84 ml  Output 2995 ml  Net -987.16 ml   Net IO Since Admission: -109.26 mL [09/03/21 1004]   Physical Examination: General exam: Lethargic ill-looking frail, older than sated age HEENT:Oral mucosa moist, Ear/Nose WNL grossly,  dentition normal. Respiratory system: bilaterally clear, no use of accessory muscle Cardiovascular system: S1 & S2 +, No JVD,. Gastrointestinal system: Abdomen soft, tender ,ND, BS+ Nervous System: Lethargic sleepy  Extremities: no edema, distal peripheral pulses palpable.  Skin: No rashes,no icterus. MSK: small/thin muscle bulk,tone, power .  Medications reviewed:  Scheduled Meds:  Chlorhexidine Gluconate Cloth  6 each Topical Daily   collagenase   Topical Daily   feeding supplement  237 mL Oral TID BM   heparin  5,000 Units Subcutaneous Q8H   insulin aspart  0-9 Units Subcutaneous Q4H   mouth rinse  15 mL Mouth Rinse BID    midodrine  2.5 mg Oral TID WC   multivitamin with minerals  1 tablet Oral Daily   phosphorus  500 mg Oral Q4H   potassium chloride  20 mEq Oral Once   Continuous Infusions:  cefTRIAXone (ROCEPHIN)  IV Stopped (09/02/21 1739)   dextrose 30 mL/hr at 09/03/21 0200   dextrose     potassium chloride     Pressure Injury 08/31/21 Thigh Anterior;Left;Proximal 4cm by 5 cm by 4 mm depth, slough present/ most likely from moisture no pressure due to locaiton (Active)  08/31/21 1630  Location: Thigh  Location Orientation: Anterior;Left;Proximal  Staging:   Wound Description (Comments): 4cm by 5 cm by 4 mm depth, slough present/ most likely from moisture no pressure due to locaiton  Present on Admission: Yes     Pressure Injury 08/31/21 Sacrum Medial;Lower Unstageable - Full thickness tissue loss in which the base of the injury is covered by slough (yellow, tan, gray, green or brown) and/or eschar (tan, brown or black) in the wound bed. 9 cm by 6 cm (Active)  08/31/21 1630  Location: Sacrum  Location Orientation: Medial;Lower  Staging: Unstageable - Full thickness tissue loss in which the base of the injury is covered by slough (yellow, tan, gray, green or brown) and/or eschar (tan, brown or black) in the wound bed.  Wound Description (Comments): 9 cm by 6 cm  Present on Admission: Yes    Diet Order             DIET - DYS 1 Room service appropriate? Yes; Fluid consistency: Thin  Diet effective now                 Weight change: -2.2 kg  Wt Readings from Last 3 Encounters:  09/03/21 53.3 kg  08/23/21 56.6 kg  08/09/21 54.4 kg    Consultants:see note  Procedures:see note Antimicrobials: Anti-infectives (From admission, onward)    Start     Dose/Rate Route Frequency Ordered Stop   09/02/21 1800  cefTRIAXone (ROCEPHIN) 2 g in sodium chloride 0.9 % 100 mL IVPB        2 g 200 mL/hr over 30 Minutes Intravenous Every 24 hours 09/02/21 1330     09/02/21 1000  vancomycin (VANCOREADY)  IVPB 1000 mg/200 mL  Status:  Discontinued        1,000 mg 200 mL/hr over 60 Minutes Intravenous Every 24 hours 09/01/21 1523 09/02/21 1330   09/01/21 1030  vancomycin (VANCOREADY) IVPB 1250 mg/250 mL        1,250 mg 166.7 mL/hr over 90 Minutes Intravenous  Once 09/01/21 0931 09/01/21 1238   09/01/21 1000  piperacillin-tazobactam (ZOSYN) IVPB 3.375 g  Status:  Discontinued        3.375 g 12.5 mL/hr over 240 Minutes Intravenous Every 8 hours 09/01/21 0931 09/02/21 1330   08/31/21 0100  vancomycin (VANCOCIN)  IVPB 1000 mg/200 mL premix        1,000 mg 200 mL/hr over 60 Minutes Intravenous  Once 08/31/21 0056 08/31/21 0409   08/31/21 0100  ceFEPIme (MAXIPIME) 2 g in sodium chloride 0.9 % 100 mL IVPB        2 g 200 mL/hr over 30 Minutes Intravenous  Once 08/31/21 0056 08/31/21 0231      Culture/Microbiology    Component Value Date/Time   SDES BLOOD BLOOD LEFT HAND 09/01/2021 1638   SPECREQUEST  09/01/2021 0822    BOTTLES DRAWN AEROBIC AND ANAEROBIC Blood Culture adequate volume   CULT  09/01/2021 0822    NO GROWTH 2 DAYS Performed at [facility], [address] [place], [place] [address]    REPTSTATUS PENDING 09/01/2021 9357    Other culture-see note  Unresulted Labs (From admission, onward)     Start     Ordered   09/04/21 0500  Phosphorus  Tomorrow morning,   R       Question:  Specimen collection method  Answer:  Unit=Unit collect   09/03/21 0824   09/03/21 0177  Basic metabolic panel  Daily,   R     Question:  Specimen collection method  Answer:  Unit=Unit collect   09/02/21 0837          Data Reviewed: I have personally reviewed following labs and imaging studies CBC: Recent Labs  Lab 08/30/21 2240 08/31/21 0517 09/01/21 0331 09/02/21 0331 09/03/21 0337  WBC 10.6* 10.6* 15.2* 19.7* 16.5*  HGB 12.4 10.6* 8.4* 7.8* 8.7*  HCT 39.0 34.2* 26.2* 25.0* 28.2*  MCV 93.3 94.7 94.2 93.6 96.2  PLT 195 170 161 146* 939*   Basic Metabolic Panel: Recent Labs   Lab 08/31/21 0517 08/31/21 2200 09/01/21 0331 09/01/21 0833 09/02/21 0331 09/02/21 1930 09/03/21 0337  NA 151* 153* 151* 148* 159*  --  160*  K 2.9* 3.0*  --  3.6 3.2* 3.5 3.3*  CL 114* 121*  --  118* 128*  --  >130*  CO2 27 26  --  25 24  --  25  GLUCOSE 123* 146*  --  111* 99  --  132*  BUN 31* 19  --  11 6*  --  6*  CREATININE 1.01* 0.86  --  0.87 0.90  --  0.96  CALCIUM 8.6* 7.9*  --  7.8* 8.2*  --  9.1  MG  --   --  1.9  --  1.6*  --  2.8*  PHOS  --  <1.0*  --  2.8 2.8  --  2.2*   GFR: Estimated Creatinine Clearance: 46.5 mL/min (by C-G formula based on SCr of 0.96 mg/dL). Liver Function Tests: Recent Labs  Lab 08/31/21 0032 08/31/21 0517 08/31/21 2200 09/01/21 1126  AST 63* 50*  --  36  ALT 135* 119*  --  73*  ALKPHOS 293* 267*  --  192*  BILITOT 1.1 1.0  --  0.7  PROT 6.2* 5.9*  --  4.7*  ALBUMIN 3.0* 2.8* 2.4* 2.1*   No results for input(s): LIPASE, AMYLASE in the last 168 hours. No results for input(s): AMMONIA in the last 168 hours. Coagulation Profile: No results for input(s): INR, PROTIME in the last 168 hours. Cardiac Enzymes: Recent Labs  Lab 08/30/21 2240  CKTOTAL 27*   BNP (last 3 results) No results for input(s): PROBNP in the last 8760 hours. HbA1C: Recent Labs    09/01/21 0331  HGBA1C 7.4*  CBG: Recent Labs  Lab 09/02/21 1116 09/02/21 1553 09/02/21 1938 09/02/21 2322 09/03/21 0744  GLUCAP 90 95 100* 105* 92   Lipid Profile: No results for input(s): CHOL, HDL, LDLCALC, TRIG, CHOLHDL, LDLDIRECT in the last 72 hours. Thyroid Function Tests: No results for input(s): TSH, T4TOTAL, FREET4, T3FREE, THYROIDAB in the last 72 hours.  Anemia Panel: No results for input(s): VITAMINB12, FOLATE, FERRITIN, TIBC, IRON, RETICCTPCT in the last 72 hours. Sepsis Labs: Recent Labs  Lab 08/30/21 2240 09/01/21 0938 09/01/21 1126 09/01/21 1409 09/01/21 1504 09/02/21 0331  PROCALCITON 1.28  --   --   --   --  0.89  LATICACIDVEN 1.9 2.3*  3.4* 2.8* 2.9*  --     Recent Results (from the past 240 hour(s))  Resp Panel by RT-PCR (Flu A&B, Covid) Nasopharyngeal Swab     Status: None   Collection Time: 08/30/21 10:40 PM   Specimen: Nasopharyngeal Swab; Nasopharyngeal(NP) swabs in vial transport medium  Result Value Ref Range Status   SARS Coronavirus 2 by RT PCR NEGATIVE NEGATIVE Final    Comment: (NOTE) SARS-CoV-2 target nucleic acids are NOT DETECTED.  The SARS-CoV-2 RNA is generally detectable in upper respiratory specimens during the acute phase of infection. The lowest concentration of SARS-CoV-2 viral copies this assay can detect is 138 copies/mL. A negative result does not preclude SARS-Cov-2 infection and should not be used as the sole basis for treatment or other patient management decisions. A negative result may occur with  improper specimen collection/handling, submission of specimen other than nasopharyngeal swab, presence of viral mutation(s) within the areas targeted by this assay, and inadequate number of viral copies(<138 copies/mL). A negative result must be combined with clinical observations, patient history, and epidemiological information. The expected result is Negative.  Fact Sheet for Patients:  EntrepreneurPulse.com.au  Fact Sheet for Healthcare Providers:  IncredibleEmployment.be  This test is no t yet approved or cleared by the [place] FDA and  has been authorized for detection and/or diagnosis of SARS-CoV-2 by FDA under an Emergency Use Authorization (EUA). This EUA will remain  in effect (meaning this test can be used) for the duration of the COVID-19 declaration under Section 564(b)(1) of the Act, 21 U.S.C.section 360bbb-3(b)(1), unless the authorization is terminated  or revoked sooner.       Influenza A by PCR NEGATIVE NEGATIVE Final   Influenza B by PCR NEGATIVE NEGATIVE Final    Comment: (NOTE) The Xpert Xpress SARS-CoV-2/FLU/RSV plus  assay is intended as an aid in the diagnosis of influenza from Nasopharyngeal swab specimens and should not be used as a sole basis for treatment. Nasal washings and aspirates are unacceptable for Xpert Xpress SARS-CoV-2/FLU/RSV testing.  Fact Sheet for Patients: EntrepreneurPulse.com.au  Fact Sheet for Healthcare Providers: IncredibleEmployment.be  This test is not yet approved or cleared by the [place] FDA and has been authorized for detection and/or diagnosis of SARS-CoV-2 by FDA under an Emergency Use Authorization (EUA). This EUA will remain in effect (meaning this test can be used) for the duration of the COVID-19 declaration under Section 564(b)(1) of the Act, 21 U.S.C. section 360bbb-3(b)(1), unless the authorization is terminated or revoked.  Performed at [facility], [address] [place], [place] [address]   Blood culture (routine x 2)     Status: None (Preliminary result)   Collection Time: 08/30/21 10:40 PM   Specimen: BLOOD  Result Value Ref Range Status   Specimen Description BLOOD RCHEST PORK  Final   Special Requests  Final    BOTTLES DRAWN AEROBIC AND ANAEROBIC Blood Culture adequate volume   Culture   Final    NO GROWTH 4 DAYS Performed at [facility], [address] [place], [place] [address]    Report Status PENDING  Incomplete  Urine Culture     Status: Abnormal   Collection Time: 08/31/21 12:32 AM   Specimen: Urine, Catheterized  Result Value Ref Range Status   Specimen Description   Final    URINE, CATHETERIZED Performed at [facility], [address] [place], [place] [address]    Special Requests   Final    NONE Performed at [facility], [address] [place], [place] [address]    Culture (A)  Final    >=100,000 COLONIES/mL VIRIDANS STREPTOCOCCUS Standardized susceptibility testing for this organism is not available. Performed at [place], [place] [address], [place], [place] [address]    Report Status 09/01/2021 FINAL  Final  Culture, blood (Routine X 2) w Reflex to ID Panel     Status: None (Preliminary result)   Collection Time: 09/01/21  8:22 AM   Specimen: BLOOD  Result Value Ref Range Status   Specimen Description BLOOD BLOOD LEFT HAND  Final   Special Requests   Final    BOTTLES DRAWN AEROBIC AND ANAEROBIC Blood Culture adequate volume   Culture   Final    NO GROWTH 2 DAYS Performed at [facility], [address], [place], [place] [address]    Report Status PENDING  Incomplete  C Difficile Quick Screen w PCR reflex     Status: None   Collection Time: 09/01/21  2:09 PM   Specimen: STOOL  Result Value Ref Range Status   C Diff antigen NEGATIVE NEGATIVE Final   C Diff toxin NEGATIVE NEGATIVE Final   C Diff interpretation No C. difficile detected.  Final    Comment: Performed at [facility], [address] [place], [place] [address]  Gastrointestinal Panel by PCR , Stool     Status: None   Collection Time: 09/01/21  2:09 PM   Specimen: Stool  Result Value Ref Range Status   Campylobacter species NOT DETECTED NOT DETECTED Final   Plesimonas shigelloides NOT DETECTED NOT DETECTED Final   Salmonella species NOT DETECTED NOT DETECTED Final   Yersinia enterocolitica NOT DETECTED NOT DETECTED Final   Vibrio species NOT DETECTED NOT DETECTED Final   Vibrio cholerae NOT DETECTED NOT DETECTED Final   Enteroaggregative E coli (EAEC) NOT DETECTED NOT DETECTED Final   Enteropathogenic E coli (EPEC) NOT DETECTED NOT DETECTED Final   Enterotoxigenic E coli (ETEC) NOT DETECTED NOT DETECTED Final   Shiga like toxin producing E coli (STEC) NOT DETECTED NOT DETECTED Final   Shigella/Enteroinvasive E coli (EIEC) NOT DETECTED NOT DETECTED Final   Cryptosporidium NOT DETECTED NOT DETECTED Final   Cyclospora cayetanensis NOT DETECTED NOT DETECTED Final   Entamoeba histolytica NOT DETECTED NOT DETECTED Final    Giardia lamblia NOT DETECTED NOT DETECTED Final   Adenovirus F40/41 NOT DETECTED NOT DETECTED Final   Astrovirus NOT DETECTED NOT DETECTED Final   Norovirus GI/GII NOT DETECTED NOT DETECTED Final   Rotavirus A NOT DETECTED NOT DETECTED Final   Sapovirus (I, II, IV, and V) NOT DETECTED NOT DETECTED Final    Comment: Performed at [facility], [address] [place], [place] [address]  MRSA Next Gen by PCR, Nasal     Status: None   Collection  Time: 09/01/21  3:03 PM   Specimen: Nasal Mucosa; Nasal Swab  Result Value Ref Range Status   MRSA by PCR Next Gen NOT DETECTED NOT DETECTED Final    Comment: (NOTE) The GeneXpert MRSA Assay (FDA approved for NASAL specimens only), is one component of a comprehensive MRSA colonization surveillance program. It is not intended to diagnose MRSA infection nor to guide or monitor treatment for MRSA infections. Test performance is not FDA approved in patients less than 79 years old. Performed at [facility], [address] [place], [place] [address]       Radiology Studies: No results found.   LOS: 3 days   Antonieta Pert, MD [facility]  09/03/2021, 10:04 AM

## 2021-09-04 ENCOUNTER — Inpatient Hospital Stay: Payer: Medicare Other

## 2021-09-04 ENCOUNTER — Encounter: Payer: Self-pay | Admitting: Internal Medicine

## 2021-09-04 LAB — GLUCOSE, CAPILLARY
Glucose-Capillary: 108 mg/dL — ABNORMAL HIGH (ref 70–99)
Glucose-Capillary: 110 mg/dL — ABNORMAL HIGH (ref 70–99)
Glucose-Capillary: 110 mg/dL — ABNORMAL HIGH (ref 70–99)
Glucose-Capillary: 118 mg/dL — ABNORMAL HIGH (ref 70–99)
Glucose-Capillary: 127 mg/dL — ABNORMAL HIGH (ref 70–99)
Glucose-Capillary: 131 mg/dL — ABNORMAL HIGH (ref 70–99)
Glucose-Capillary: 134 mg/dL — ABNORMAL HIGH (ref 70–99)
Glucose-Capillary: 66 mg/dL — ABNORMAL LOW (ref 70–99)

## 2021-09-04 LAB — MAGNESIUM: Magnesium: 2.4 mg/dL (ref 1.7–2.4)

## 2021-09-04 LAB — BASIC METABOLIC PANEL
Anion gap: 7 (ref 5–15)
BUN: 8 mg/dL (ref 8–23)
CO2: 21 mmol/L — ABNORMAL LOW (ref 22–32)
Calcium: 8.7 mg/dL — ABNORMAL LOW (ref 8.9–10.3)
Chloride: 129 mmol/L — ABNORMAL HIGH (ref 98–111)
Creatinine, Ser: 0.93 mg/dL (ref 0.44–1.00)
GFR, Estimated: >60 mL/min (ref >60)
Glucose, Bld: 127 mg/dL — ABNORMAL HIGH (ref 70–99)
Potassium: 3.5 mmol/L (ref 3.5–5.1)
Sodium: 157 mmol/L — ABNORMAL HIGH (ref 135–145)

## 2021-09-04 LAB — CBC
HCT: 30.9 % — ABNORMAL LOW (ref 36.0–46.0)
Hemoglobin: 9.6 g/dL — ABNORMAL LOW (ref 12.0–15.0)
MCH: 29.7 pg (ref 26.0–34.0)
MCHC: 31.1 g/dL (ref 30.0–36.0)
MCV: 95.7 fL (ref 80.0–100.0)
Platelets: 146 10*3/uL — ABNORMAL LOW (ref 150–400)
RBC: 3.23 MIL/uL — ABNORMAL LOW (ref 3.87–5.11)
RDW: 21.7 % — ABNORMAL HIGH (ref 11.5–15.5)
WBC: 13.9 10*3/uL — ABNORMAL HIGH (ref 4.0–10.5)
nRBC: 3 % — ABNORMAL HIGH (ref 0.0–0.2)

## 2021-09-04 LAB — PHOSPHORUS: Phosphorus: 1.7 mg/dL — ABNORMAL LOW (ref 2.5–4.6)

## 2021-09-04 LAB — CULTURE, BLOOD (ROUTINE X 2)
Culture: NO GROWTH
Special Requests: ADEQUATE

## 2021-09-04 MED ORDER — IOHEXOL 300 MG/ML  SOLN
75.0000 mL | Freq: Once | INTRAMUSCULAR | Status: AC | PRN
  Administered 2021-09-04: 11:00:00 75 mL via INTRAVENOUS

## 2021-09-04 MED ORDER — POTASSIUM PHOSPHATES 15 MMOLE/5ML IV SOLN
30.0000 mmol | Freq: Once | INTRAVENOUS | Status: AC
  Administered 2021-09-04: 07:00:00 30 mmol via INTRAVENOUS
  Filled 2021-09-04: qty 10

## 2021-09-04 MED ORDER — LOPERAMIDE HCL 2 MG PO CAPS
2.0000 mg | ORAL_CAPSULE | ORAL | Status: DC | PRN
  Administered 2021-09-04: 13:00:00 2 mg via ORAL
  Filled 2021-09-04: qty 1

## 2021-09-04 NOTE — Progress Notes (Signed)
PROGRESS NOTE    Sandra Jordan  KDT:[id number] DOB: 1951-04-13 DOA: 08/30/2021 PCP: Marinda Elk, MD   Chief Complaint  Patient presents with   Weakness  Brief Narrative/Hospital Course: Sandra Jordan, 70 y.o. 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  She lives with her brother.  Then nephew brought the patient to the hospital  She was seen in oncology clinic on 10/26, had completed whole brain XRT, PET from September 14 showed multiple lung mets but no evidence of systemic disease other than patient's known lesion in her brain, oncology proceeded with carboplatinum Taxol and Avastin treatment reduced due to liver dysfunction.  In the ED labs showed hyponatremia, hypokalemia AKI creatinine 1.9 anion gap 20, Pro-Cal 1.2 lactic acid 1.9. COVID-19 present negative, urine/blood cultures sent.  CT head no acute finding, chest x-ray significantly improved aeration of the right upper lobe although persistent infiltrate remained.  Ultrasound 07/27/2021 echogenic liver with hepatic steatosis, S/P cholecystectomy. Patient continues to remain weak frail encephalopathy and also started to have a soft blood pressure placed on aggressive IV fluid boluses 2 L, trended lactic acid and transferred to ICU in case patient needs pressors given severe sepsis. Culture data came back positive for streptococcal UTI blood culture negative palliative care was consulted given her declining condition and seen by pulmonary critical care. Patient/family elected to DNR/DNI  Subjective: Seen and examined this morning.  Blood  pressure soft but on recheck improved currently 120s. Patient appears alert awake and oriented back to baseline.  Denies nausea vomiting still has ongoing diarrhea with rectal tube in place.  Saturating 90% on room air. Not eating much.  Assessment & Plan:  Severe sepsis POA from UTI  Hypotension-multifactorial from sepsis, diarrhea, poor intake Hemodynamically stable although blood pressure soft at times.  Strep viridans in urine culture.UA wbc 6-10 Nitrite neg,le mod. Blood cx no growth so far. Antibiotic de-escalated to ceftriaxone 11/26.  WBC nicely improving hemodynamically stable. Hopefully can transfer out of step down today.  Due to persistent diarrhea will obtain CT abdomen pelvis Recent Labs  Lab 08/30/21 2240 08/31/21 0517 09/01/21 0331 09/01/21 0938 09/01/21 1126 09/01/21 1409 09/01/21 1504 09/02/21 0331 09/03/21 0337 09/04/21 0301  WBC 10.6* 10.6* 15.2*  --   --   --   --  19.7* 16.5* 13.9*  LATICACIDVEN 1.9  --   --  2.3* 3.4* 2.8* 2.9*  --   --   --   PROCALCITON 1.28  --   --   --   --   --   --  0.89  --   --      Acute metabolic encephalopathy:  due to sepsis dehydration deconditioning as well as brain mets  She was found confused was found on feces lying in the bed, CT head no acute finding, likely multifactorial.This morning she is alert awake oriented close to baseline.  Continue supportive care.    Acute Hypoxic respiratory failure:In the setting of her lung cancer postobstructive changes. currently doing well on room air.  Adult failure to thrive:In the setting of metastatic cancer on chemotherapy, poor oral intake:Augment diet as tolerated,  RD consult palliative care consulted.PTOT eval as able  Hypernatremia Dehydration volume depletion: Sodium now improving on D5 water, off isotonic fluid.  Encourage oral intake. Recent Labs  Lab 09/01/21 0833 09/02/21 0331 09/03/21 0337 09/03/21 2214 09/04/21 0301  NA 148* 159* 160* 157* 157*      Hypokalemia/hypophosphatemia/hypomagnesemia: Repleted this morning.  Continue to monitor  Recent Labs  Lab 09/01/21 0833 09/02/21 0331 09/02/21 1930 09/03/21 0337 09/04/21 0301  K 3.6 3.2* 3.5 3.3* 3.5     Loose stool/diarrhea patient was covered in feces at home.  Negative GI panel and C. Difficile.  Cont on Rectal tube, supportive care. Diarrhea still copious, watery ~ 800 ml.  Will obtain CT abdomen pelvis, added Imodium.    IWP:YKDX poor oral intake/ dehydration improved with IV fluids.   Transaminitis: AST improved to 36 ALT still limited 43.  Monito Had cholecystectomy in previous ultrasound.  Stage 4 right upper lung adenocarcinoma with innumerable mets to brain: has had whole brain radiation in October, on carboplatin Taxol and Avastin currently followed by outpatient oncology. Last chemo Wednesday, gets q3 wks. wondering if mass is having local compression effect on the airway as well as esophagus causing dysphagia difficult to with intake  Anemia of chronic disease hemoglobin stable uptrending 9.6 g.  Monitor.   Recent Labs  Lab 08/31/21 0517 09/01/21 0331 09/02/21 0331 09/03/21 0337 09/04/21 0301  HGB 10.6* 8.4* 7.8* 8.7* 9.6*  HCT 34.2* 26.2* 25.0* 28.2* 30.9*     Urine retention Foley inserted 11/24 with 1.5 L urine out immediately.  Continue the same.  Pressure injury sacrum and left thigh: POA wound care following continue wound dressing and local care.  Hypoglycemia: In the setting of poor oral intake sepsis. On D10 infusion- wean to d5w.  Recent Labs  Lab 09/03/21 1557 09/03/21 2003 09/04/21 0008 09/04/21 0351 09/04/21 0808  GLUCAP 102* 107* 110* 108* 134*    Goals of care: was full code.  Palliative meeting held and patient has been transitioned to DNR/DNI.  Family would like to keep the patient comfortable but continue on current treatment.  Palliative will follow on Monday.   Code Status:   Code Status: DNR Family Communication: plan of care  discussed with patient's nephew previously-, niece at the bedside again today Status is: Inpatient Remains inpatient appropriate because: Remains inpatient for ongoing management of hypertension failure to thrive electrolyte imbalance Disposition: Currently not medically stable for discharge. Anticipated Disposition: TBD  Objective: Vitals last 24 hrs: Vitals:   09/04/21 0500 09/04/21 0600 09/04/21 0700 09/04/21 0804  BP: 119/79 (!) 77/64 (!) 80/44 95/72  Pulse: (!) 107 (!) 108 (!) 102   Resp: 14 19 18 20   Temp:      TempSrc:      SpO2: 93% 97% 96%   Weight:      Height:       Weight change: -1.2 kg  Intake/Output Summary (Last 24 hours) at 09/04/2021 1036 Last data filed at 09/04/2021 1000 Gross per 24 hour  Intake 1325.99 ml  Output 2175 ml  Net -849.01 ml    Net IO Since Admission: -958.27 mL [09/04/21 1036]   Physical Examination: General exam: AAO to self place people close to baseline, appears older than his stated age, weak looking cachectic. HEENT:Oral mucosa moist, Ear/Nose WNL grossly, dentition normal. Respiratory system: bilaterally clear, no use of accessory muscle Cardiovascular system: S1 & S2 +, No JVD,. Gastrointestinal system: Abdomen soft, diffuse tenderness present,ND, BS+ Nervous System:Alert, awake, moving extremities  and grossly nonfocal Extremities: No edema, distal peripheral pulses palpable.  Skin: No rashes,no icterus. MSK: thin muscle bulk,tone, power   Medications reviewed:  Scheduled Meds:  Chlorhexidine Gluconate Cloth  6 each Topical Daily   collagenase   Topical Daily   feeding supplement  237 mL Oral TID BM   heparin  5,000 Units Subcutaneous Q8H   insulin aspart  0-9 Units Subcutaneous Q4H   mouth rinse  15 mL Mouth Rinse BID   midodrine  2.5 mg Oral TID WC   multivitamin with minerals  1 tablet Oral Daily   Continuous Infusions:  cefTRIAXone (ROCEPHIN)  IV Stopped (09/03/21 1645)   dextrose 100 mL/hr at 09/04/21 0647    potassium PHOSPHATE IVPB (in mmol) 30 mmol (09/04/21 0647)   Pressure Injury 08/31/21 Thigh Anterior;Left;Proximal 4cm by 5 cm by 4 mm depth, slough present/ most likely from moisture no pressure due to locaiton (Active)  08/31/21 1630  Location: Thigh  Location Orientation: Anterior;Left;Proximal  Staging:   Wound Description (Comments): 4cm by 5 cm by 4 mm depth, slough present/ most likely from moisture no pressure due to locaiton  Present on Admission: Yes     Pressure Injury 08/31/21 Sacrum Medial;Lower Unstageable - Full thickness tissue loss in which the base of the injury is covered by slough (yellow, tan, gray, green or brown) and/or eschar (tan, brown or black) in the wound bed. 9 cm by 6 cm (Active)  08/31/21 1630  Location: Sacrum  Location Orientation: Medial;Lower  Staging: Unstageable - Full thickness tissue loss in which the base of the injury is covered by slough (yellow, tan, gray, green or brown) and/or eschar (tan, brown or black) in the wound bed.  Wound Description (Comments): 9 cm by 6 cm  Present on Admission: Yes    Diet Order             DIET - DYS 1 Room service appropriate? Yes; Fluid consistency: Thin  Diet effective now                 Weight change: -1.2 kg  Wt Readings from Last 3 Encounters:  09/04/21 52.1 kg  08/23/21 56.6 kg  08/09/21 54.4 kg    Consultants:see note  Procedures:see note Antimicrobials: Anti-infectives (From admission, onward)    Start     Dose/Rate Route Frequency Ordered Stop   09/02/21 1800  cefTRIAXone (ROCEPHIN) 2 g in sodium chloride 0.9 % 100 mL IVPB        2 g 200 mL/hr over 30 Minutes Intravenous Every 24 hours 09/02/21 1330     09/02/21 1000  vancomycin (VANCOREADY) IVPB 1000 mg/200 mL  Status:  Discontinued        1,000 mg 200 mL/hr over 60 Minutes Intravenous Every 24 hours 09/01/21 1523 09/02/21 1330   09/01/21 1030  vancomycin (VANCOREADY) IVPB 1250 mg/250 mL        1,250 mg 166.7 mL/hr over 90 Minutes  Intravenous  Once 09/01/21 0931 09/01/21 1238   09/01/21 1000  piperacillin-tazobactam (ZOSYN) IVPB 3.375 g  Status:  Discontinued        3.375 g 12.5 mL/hr over 240 Minutes Intravenous Every 8 hours 09/01/21 0931 09/02/21 1330   08/31/21 0100  vancomycin (VANCOCIN) IVPB 1000 mg/200 mL premix        1,000 mg 200 mL/hr over 60 Minutes Intravenous  Once 08/31/21 0056 08/31/21 0409   08/31/21 0100  ceFEPIme (MAXIPIME) 2 g in sodium chloride 0.9 % 100 mL IVPB  2 g 200 mL/hr over 30 Minutes Intravenous  Once 08/31/21 0056 08/31/21 0231      Culture/Microbiology    Component Value Date/Time   SDES BLOOD BLOOD LEFT HAND 09/01/2021 5809   SPECREQUEST  09/01/2021 0822    BOTTLES DRAWN AEROBIC AND ANAEROBIC Blood Culture adequate volume   CULT  09/01/2021 0822    NO GROWTH 3 DAYS Performed at [facility], [address] [place], [place] [address]    REPTSTATUS PENDING 09/01/2021 2505    Other culture-see note  Unresulted Labs (From admission, onward)     Start     Ordered   09/03/21 3976  Basic metabolic panel  Daily,   R     Question:  Specimen collection method  Answer:  Unit=Unit collect   09/02/21 0837          Data Reviewed: I have personally reviewed following labs and imaging studies CBC: Recent Labs  Lab 08/31/21 0517 09/01/21 0331 09/02/21 0331 09/03/21 0337 09/04/21 0301  WBC 10.6* 15.2* 19.7* 16.5* 13.9*  HGB 10.6* 8.4* 7.8* 8.7* 9.6*  HCT 34.2* 26.2* 25.0* 28.2* 30.9*  MCV 94.7 94.2 93.6 96.2 95.7  PLT 170 161 146* 140* 146*    Basic Metabolic Panel: Recent Labs  Lab 08/31/21 2200 09/01/21 0331 09/01/21 0833 09/02/21 0331 09/02/21 1930 09/03/21 0337 09/03/21 2214 09/04/21 0301  NA 153* 151* 148* 159*  --  160* 157* 157*  K 3.0*  --  3.6 3.2* 3.5 3.3*  --  3.5  CL 121*  --  118* 128*  --  >130*  --  129*  CO2 26  --  25 24  --  25  --  21*  GLUCOSE 146*  --  111* 99  --  132*  --  127*  BUN 19  --  11 6*  --  6*  --  8   CREATININE 0.86  --  0.87 0.90  --  0.96  --  0.93  CALCIUM 7.9*  --  7.8* 8.2*  --  9.1  --  8.7*  MG  --  1.9  --  1.6*  --  2.8*  --  2.4  PHOS <1.0*  --  2.8 2.8  --  2.2*  --  1.7*    GFR: Estimated Creatinine Clearance: 47 mL/min (by C-G formula based on SCr of 0.93 mg/dL). Liver Function Tests: Recent Labs  Lab 08/31/21 0032 08/31/21 0517 08/31/21 2200 09/01/21 1126  AST 63* 50*  --  36  ALT 135* 119*  --  73*  ALKPHOS 293* 267*  --  192*  BILITOT 1.1 1.0  --  0.7  PROT 6.2* 5.9*  --  4.7*  ALBUMIN 3.0* 2.8* 2.4* 2.1*    No results for input(s): LIPASE, AMYLASE in the last 168 hours. No results for input(s): AMMONIA in the last 168 hours. Coagulation Profile: No results for input(s): INR, PROTIME in the last 168 hours. Cardiac Enzymes: Recent Labs  Lab 08/30/21 2240  CKTOTAL 27*    BNP (last 3 results) No results for input(s): PROBNP in the last 8760 hours. HbA1C: No results for input(s): HGBA1C in the last 72 hours.  CBG: Recent Labs  Lab 09/03/21 1557 09/03/21 2003 09/04/21 0008 09/04/21 0351 09/04/21 0808  GLUCAP 102* 107* 110* 108* 134*    Lipid Profile: No results for input(s): CHOL, HDL, LDLCALC, TRIG, CHOLHDL, LDLDIRECT in the last 72 hours. Thyroid Function Tests: No results for input(s): TSH, T4TOTAL, FREET4, T3FREE,  THYROIDAB in the last 72 hours.  Anemia Panel: No results for input(s): VITAMINB12, FOLATE, FERRITIN, TIBC, IRON, RETICCTPCT in the last 72 hours. Sepsis Labs: Recent Labs  Lab 08/30/21 2240 09/01/21 0938 09/01/21 1126 09/01/21 1409 09/01/21 1504 09/02/21 0331  PROCALCITON 1.28  --   --   --   --  0.89  LATICACIDVEN 1.9 2.3* 3.4* 2.8* 2.9*  --      Recent Results (from the past 240 hour(s))  Resp Panel by RT-PCR (Flu A&B, Covid) Nasopharyngeal Swab     Status: None   Collection Time: 08/30/21 10:40 PM   Specimen: Nasopharyngeal Swab; Nasopharyngeal(NP) swabs in vial transport medium  Result Value Ref Range  Status   SARS Coronavirus 2 by RT PCR NEGATIVE NEGATIVE Final    Comment: (NOTE) SARS-CoV-2 target nucleic acids are NOT DETECTED.  The SARS-CoV-2 RNA is generally detectable in upper respiratory specimens during the acute phase of infection. The lowest concentration of SARS-CoV-2 viral copies this assay can detect is 138 copies/mL. A negative result does not preclude SARS-Cov-2 infection and should not be used as the sole basis for treatment or other patient management decisions. A negative result may occur with  improper specimen collection/handling, submission of specimen other than nasopharyngeal swab, presence of viral mutation(s) within the areas targeted by this assay, and inadequate number of viral copies(<138 copies/mL). A negative result must be combined with clinical observations, patient history, and epidemiological information. The expected result is Negative.  Fact Sheet for Patients:  EntrepreneurPulse.com.au  Fact Sheet for Healthcare Providers:  IncredibleEmployment.be  This test is no t yet approved or cleared by the [place] FDA and  has been authorized for detection and/or diagnosis of SARS-CoV-2 by FDA under an Emergency Use Authorization (EUA). This EUA will remain  in effect (meaning this test can be used) for the duration of the COVID-19 declaration under Section 564(b)(1) of the Act, 21 U.S.C.section 360bbb-3(b)(1), unless the authorization is terminated  or revoked sooner.       Influenza A by PCR NEGATIVE NEGATIVE Final   Influenza B by PCR NEGATIVE NEGATIVE Final    Comment: (NOTE) The Xpert Xpress SARS-CoV-2/FLU/RSV plus assay is intended as an aid in the diagnosis of influenza from Nasopharyngeal swab specimens and should not be used as a sole basis for treatment. Nasal washings and aspirates are unacceptable for Xpert Xpress SARS-CoV-2/FLU/RSV testing.  Fact Sheet for  Patients: EntrepreneurPulse.com.au  Fact Sheet for Healthcare Providers: IncredibleEmployment.be  This test is not yet approved or cleared by the [place] FDA and has been authorized for detection and/or diagnosis of SARS-CoV-2 by FDA under an Emergency Use Authorization (EUA). This EUA will remain in effect (meaning this test can be used) for the duration of the COVID-19 declaration under Section 564(b)(1) of the Act, 21 U.S.C. section 360bbb-3(b)(1), unless the authorization is terminated or revoked.  Performed at [facility], [address] [place], [place] [address]   Blood culture (routine x 2)     Status: None   Collection Time: 08/30/21 10:40 PM   Specimen: BLOOD  Result Value Ref Range Status   Specimen Description BLOOD RCHEST PORK  Final   Special Requests   Final    BOTTLES DRAWN AEROBIC AND ANAEROBIC Blood Culture adequate volume   Culture   Final    NO GROWTH 5 DAYS Performed at [facility], [address] [place], [place] [address]    Report Status 09/04/2021 FINAL  Final  Urine Culture  Status: Abnormal   Collection Time: 08/31/21 12:32 AM   Specimen: Urine, Catheterized  Result Value Ref Range Status   Specimen Description   Final    URINE, CATHETERIZED Performed at [facility], [address], [place], [place] [address]    Special Requests   Final    NONE Performed at [facility], [address] [place], [place] [address]    Culture (A)  Final    >=100,000 COLONIES/mL VIRIDANS STREPTOCOCCUS Standardized susceptibility testing for this organism is not available. Performed at [facility], [place] [address] [place], [place] [address]    Report Status 09/01/2021 FINAL  Final  Culture, blood (Routine X 2) w Reflex to ID Panel     Status: None (Preliminary result)   Collection Time: 09/01/21  8:22 AM   Specimen: BLOOD  Result Value Ref Range Status   Specimen  Description BLOOD BLOOD LEFT HAND  Final   Special Requests   Final    BOTTLES DRAWN AEROBIC AND ANAEROBIC Blood Culture adequate volume   Culture   Final    NO GROWTH 3 DAYS Performed at [facility], [address], [place], [place] [address]    Report Status PENDING  Incomplete  C Difficile Quick Screen w PCR reflex     Status: None   Collection Time: 09/01/21  2:09 PM   Specimen: STOOL  Result Value Ref Range Status   C Diff antigen NEGATIVE NEGATIVE Final   C Diff toxin NEGATIVE NEGATIVE Final   C Diff interpretation No C. difficile detected.  Final    Comment: Performed at [facility], [address] [place], [place] [address]  Gastrointestinal Panel by PCR , Stool     Status: None   Collection Time: 09/01/21  2:09 PM   Specimen: Stool  Result Value Ref Range Status   Campylobacter species NOT DETECTED NOT DETECTED Final   Plesimonas shigelloides NOT DETECTED NOT DETECTED Final   Salmonella species NOT DETECTED NOT DETECTED Final   Yersinia enterocolitica NOT DETECTED NOT DETECTED Final   Vibrio species NOT DETECTED NOT DETECTED Final   Vibrio cholerae NOT DETECTED NOT DETECTED Final   Enteroaggregative E coli (EAEC) NOT DETECTED NOT DETECTED Final   Enteropathogenic E coli (EPEC) NOT DETECTED NOT DETECTED Final   Enterotoxigenic E coli (ETEC) NOT DETECTED NOT DETECTED Final   Shiga like toxin producing E coli (STEC) NOT DETECTED NOT DETECTED Final   Shigella/Enteroinvasive E coli (EIEC) NOT DETECTED NOT DETECTED Final   Cryptosporidium NOT DETECTED NOT DETECTED Final   Cyclospora cayetanensis NOT DETECTED NOT DETECTED Final   Entamoeba histolytica NOT DETECTED NOT DETECTED Final   Giardia lamblia NOT DETECTED NOT DETECTED Final   Adenovirus F40/41 NOT DETECTED NOT DETECTED Final   Astrovirus NOT DETECTED NOT DETECTED Final   Norovirus GI/GII NOT DETECTED NOT DETECTED Final   Rotavirus A NOT DETECTED NOT DETECTED Final   Sapovirus (I, II, IV, and V)  NOT DETECTED NOT DETECTED Final    Comment: Performed at [facility], [address] [place], [place] [address]  MRSA Next Gen by PCR, Nasal     Status: None   Collection Time: 09/01/21  3:03 PM   Specimen: Nasal Mucosa; Nasal Swab  Result Value Ref Range Status   MRSA by PCR Next Gen NOT DETECTED NOT DETECTED Final    Comment: (NOTE) The GeneXpert MRSA Assay (FDA approved for NASAL specimens only), is one component of a comprehensive MRSA colonization  surveillance program. It is not intended to diagnose MRSA infection nor to guide or monitor treatment for MRSA infections. Test performance is not FDA approved in patients less than 6 years old. Performed at [facility], [address], [place], [place] [address]       Radiology Studies: No results found.   LOS: 4 days   Antonieta Pert, MD [facility]  09/04/2021, 10:36 AM

## 2021-09-04 NOTE — Progress Notes (Signed)
1145 Patient returned from CT via bed.

## 2021-09-05 LAB — CBC
HCT: 33.1 % — ABNORMAL LOW (ref 36.0–46.0)
Hemoglobin: 10.5 g/dL — ABNORMAL LOW (ref 12.0–15.0)
MCH: 30 pg (ref 26.0–34.0)
MCHC: 31.7 g/dL (ref 30.0–36.0)
MCV: 94.6 fL (ref 80.0–100.0)
Platelets: 129 10*3/uL — ABNORMAL LOW (ref 150–400)
RBC: 3.5 MIL/uL — ABNORMAL LOW (ref 3.87–5.11)
RDW: 21.5 % — ABNORMAL HIGH (ref 11.5–15.5)
WBC: 13 10*3/uL — ABNORMAL HIGH (ref 4.0–10.5)
nRBC: 1.8 % — ABNORMAL HIGH (ref 0.0–0.2)

## 2021-09-05 LAB — GLUCOSE, CAPILLARY
Glucose-Capillary: 136 mg/dL — ABNORMAL HIGH (ref 70–99)
Glucose-Capillary: 90 mg/dL (ref 70–99)
Glucose-Capillary: 138 mg/dL — ABNORMAL HIGH (ref 70–99)
Glucose-Capillary: 61 mg/dL — ABNORMAL LOW (ref 70–99)
Glucose-Capillary: 145 mg/dL — ABNORMAL HIGH (ref 70–99)

## 2021-09-05 LAB — BASIC METABOLIC PANEL
Anion gap: 7 (ref 5–15)
BUN: 7 mg/dL — ABNORMAL LOW (ref 8–23)
CO2: 20 mmol/L — ABNORMAL LOW (ref 22–32)
Calcium: 8.7 mg/dL — ABNORMAL LOW (ref 8.9–10.3)
Chloride: 118 mmol/L — ABNORMAL HIGH (ref 98–111)
Creatinine, Ser: 0.95 mg/dL (ref 0.44–1.00)
GFR, Estimated: >60 mL/min (ref >60)
Glucose, Bld: 125 mg/dL — ABNORMAL HIGH (ref 70–99)
Potassium: 3.1 mmol/L — ABNORMAL LOW (ref 3.5–5.1)
Sodium: 145 mmol/L (ref 135–145)

## 2021-09-05 NOTE — Progress Notes (Signed)
Palliative: Mrs. Gutridge is lying quietly in bed.  She greets me, making and mostly keeping eye contact.  She appears acutely/chronically ill and quite frail, her lips are dry.  She is alert, oriented to person, place, situation.  I believe that she is able to make her basic needs known.  There is no family at bedside at this time, but her friend, Caryl Asp, is present.  Call to nephew, Alberteen Sam.  Left voicemail message.   Call to niece, Hedwig Morton.  We talk in detail about Mrs. Sarff's acute health concerns, her frailty.  It seems that medication sees her very clearly.  We talked about PT evaluation and possible trial of short-term rehab.  She related will be clear after 3 weeks of rehab whether Mrs. Eshleman is able to have meaningful recovery.  We talked about following up with oncology to see if she is strong enough any further cancer treatments.  At this point Jaynie Collins shares that she does not feel that Mrs. Tupou would be a candidate, but understands and agrees that she needs to hear this from oncology.  We talked about outpatient palliative services and outpatient hospice services, what is provided by both.  She is agreeable to outpatient palliative care, transitioning to hospice services when appropriate.  Ms. Gouveia completed whole brain radiation around 08 July 2021.   Mrs. Salvaggio is followed by Dr. Grayland Ormond oncology at [facility] and was seen 08/23/2021 for cycle 3 of carboplatinum, Taxol and Avastin.  It remains to be seen if she will continue to be strong enough for further treatment.  Follow-up with Dr. Grayland Ormond oncology outpatient.  PT evaluation for ability to rehab.  Conference with attending, bedside nursing staff, transition of care team related to patient condition, needs, goals of care, disposition.  Plan: At this point continue to treat the treatable but no CPR or intubation.  Short-term rehab.  50 minutes  Quinn Axe, NP Palliative medicine team Team phone [phone] Greater  than 50% of this time was spent counseling and coordinating care related to the above assessment and plan.

## 2021-09-05 NOTE — Progress Notes (Signed)
Brief Narrative/Hospital Course: Sandra Jordan, 70 y.o. 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  She lives with her brother.  Then nephew brought the patient to the hospital   She was seen in oncology clinic on 10/26, had completed whole brain XRT, PET from September 14 showed multiple lung mets but no evidence of systemic disease other than patient's known lesion in her brain, oncology proceeded with carboplatinum Taxol and Avastin treatment reduced due to liver dysfunction.   Ultrasound 07/27/2021 echogenic liver with hepatic steatosis, S/P cholecystectomy.  Patient was having a transferred to ICU in case patient needs pressors given severe sepsis.  Culture data came back positive for streptococcal UTI. Blood culture negative. Palliative care was consulted given her declining condition and seen by pulmonary critical care.Patient/family elected to DNR/DNI  Subjective:  Patient seems to be doing clinically well today.  She continues to feel weak.  Her appetite remains poor.  Denies nausea or vomiting today.  Denies shortness of breath.  Currently saturating well on room air.  No obvious evidence of respiratory distress.  Objective: Vital signs in last 24 hours: Temp:  [98 F (36.7 C)-100.8 F (38.2 C)] 98 F (36.7 C) (11/29 1400) Pulse Rate:  [27-124] 123 (11/29 1600) Resp:  [14-31] 18 (11/29 1600) BP: (98-139)/(70-113) 130/93 (11/29 1600) SpO2:  [87 %-100 %] 96 % (11/29 1600) Weight:  [56.1 kg] 56.1 kg (11/29 0500) Weight change: 4 kg Last BM Date: 09/05/21  Intake/Output from previous day: 11/28  0701 - 11/29 0700 In: 440 [P.O.:440] Out: 2175 [Urine:1625; Stool:550] Intake/Output this shift: Total I/O In: 120 [P.O.:120] Out: -   General appearance: alert, cooperative, and cachectic Resp: diminished breath sounds bilaterally Cardio: regular rate and rhythm, S1, S2 normal, no murmur, click, rub or gallop and no click GI: soft, non-tender; bowel sounds normal; no masses,  no organomegaly Extremities: no ulcers, gangrene or trophic changes Skin: Skin color, texture, turgor normal. No rashes or lesions Neurologic: Alert and oriented X 3, normal strength and tone. Normal symmetric reflexes. Normal coordination and gait  Lab Results: Recent Labs    09/04/21 0301 09/05/21 0624  WBC 13.9* 13.0*  HGB 9.6* 10.5*  HCT 30.9* 33.1*  PLT 146* 129*   BMET Recent Labs    09/04/21 0301 09/05/21 0624  NA 157* 145  K 3.5 3.1*  CL 129* 118*  CO2 21* 20*  GLUCOSE 127* 125*  BUN 8 7*  CREATININE 0.93 0.95  CALCIUM 8.7* 8.7*    Studies/Results: CT ABDOMEN PELVIS W CONTRAST  Result Date: 09/04/2021 CLINICAL DATA:  70 year old female with history of abdominal pain. Fever. EXAM: CT ABDOMEN AND PELVIS WITH CONTRAST TECHNIQUE: Multidetector CT imaging of the abdomen and pelvis was performed using the standard protocol following bolus administration of intravenous contrast. CONTRAST:  34mL OMNIPAQUE IOHEXOL 300 MG/ML  SOLN COMPARISON:  CT the chest, abdomen and pelvis 06/12/2021. FINDINGS: Lower chest: Trace bilateral pleural effusions lying dependently with some minimal passive subsegmental atelectasis in the lower lobes of the lungs bilaterally. Hepatobiliary: No suspicious cystic or solid hepatic lesions are confidently identified on today's noncontrast CT examination. Status post cholecystectomy. Pancreas:  No pancreatic mass. No pancreatic ductal dilatation. No pancreatic or peripancreatic fluid collections or inflammatory changes. Spleen: Unremarkable. Adrenals/Urinary Tract: In the lower  pole collecting system of the left kidney there is a 7 mm nonobstructive calculus. In the upper pole of the right kidney there is a subcentimeter low-attenuation lesion, too small to characterize, but statistically likely to represent a cyst. Bilateral adrenal glands are normal in appearance. No hydroureteronephrosis. Urinary bladder is completely decompressed around an indwelling Foley balloon catheter. Stomach/Bowel: The appearance of the stomach is normal. No pathologic dilatation of small bowel or colon. Rectal bag in position. Normal appendix. Vascular/Lymphatic: Aortic atherosclerosis, without evidence of aneurysm or dissection in the abdominal or pelvic vasculature. No lymphadenopathy noted in the abdomen or pelvis. Reproductive: Status post hysterectomy. Ovaries are not confidently identified may be surgically absent or atrophic. Other: No significant volume of ascites.  No pneumoperitoneum. Musculoskeletal: There are no aggressive appearing lytic or blastic lesions noted in the visualized portions of the skeleton. IMPRESSION: 1. No acute findings are noted in the abdomen or pelvis to account for the patient's symptoms. 2. Trace bilateral pleural effusions. 3. 7 mm nonobstructive calculus in the lower pole collecting system of left kidney. No ureteral stones or findings of urinary tract obstruction are noted at this time. 4. Aortic atherosclerosis. 5. Additional incidental findings, as above. Electronically Signed   By: Vinnie Langton M.D.   On: 09/04/2021 12:07    Medications: Scheduled:  Chlorhexidine Gluconate Cloth  6 each Topical Daily   collagenase   Topical Daily   feeding supplement  237 mL Oral TID BM   heparin  5,000 Units Subcutaneous Q8H   insulin aspart  0-9 Units Subcutaneous Q4H   mouth rinse  15 mL Mouth Rinse BID   midodrine  2.5 mg Oral TID WC   multivitamin with minerals  1 tablet Oral Daily    Assessment/Plan:  UTI with severe sepsis: Present on admission.  Urine cultures  positive for strep viridans.  Antibiotics were de-escalated to Rocephin on 11/26.  Patient will complete course today.  Clinically improved and back to baseline.  Diarrhea: C. difficile negative.  GI panel negative rectal tube in situ.  Could likely be related to antibiotics.  CT abdomen and pelvis done yesterday.  Unremarkable for any acute findings.  Acute metabolic encephalopathy: Present on admission: Likely due to UTI with sepsis.  Toxic encephalopathy has since resolved.  CT scan on admission was negative for any acute intracranial normality.  Supportive care advised.  Acute hypoxic respiratory failure: Resolved.  History of stage IV lung cancer with metastasis.  Patient doing clinically well.  Not requiring oxygen at this time.  Adult failure to thrive in the context of metastatic cancer secondary to lung malignancy(stage IV adenocarcinoma):Marland Kitchen  Patient had required whole brain radiation in the past.  Treated with 3 cycles of carboplatinum, Taxol and Avastin.  Family to discuss with oncology regarding further management.  Palliative care consult note appreciated.  Patient is currently DNR/DNI.  Outpatient follow-up with oncology team was advised.  Chronic decubitus ulcer secondary to pressure injury.  Sacrum and left thigh.  WOCN continues to follow-up with patient.  Dressing as per protocol.  Acute kidney injury: Resolved.  Likely due to ATN from sepsis.  Anemia of chronic disease: Hemoglobin remained stable at this point.  Hypoglycemia: Resolved:  VTE prophylaxis with heparin.  Patient will be transferred to telemetry bed.  No indication for ICU stay at this time.  LOS: 5 days  Warnell Bureau Darnice Comrie 09/05/2021, 4:32 PM

## 2021-09-06 LAB — BASIC METABOLIC PANEL
Anion gap: 3 — ABNORMAL LOW (ref 5–15)
BUN: 7 mg/dL — ABNORMAL LOW (ref 8–23)
CO2: 20 mmol/L — ABNORMAL LOW (ref 22–32)
Calcium: 7.7 mg/dL — ABNORMAL LOW (ref 8.9–10.3)
Chloride: 122 mmol/L — ABNORMAL HIGH (ref 98–111)
Creatinine, Ser: 0.76 mg/dL (ref 0.44–1.00)
GFR, Estimated: >60 mL/min (ref >60)
Glucose, Bld: 320 mg/dL — ABNORMAL HIGH (ref 70–99)
Potassium: 2.2 mmol/L — CL (ref 3.5–5.1)
Sodium: 145 mmol/L (ref 135–145)

## 2021-09-06 LAB — GLUCOSE, CAPILLARY
Glucose-Capillary: 108 mg/dL — ABNORMAL HIGH (ref 70–99)
Glucose-Capillary: 109 mg/dL — ABNORMAL HIGH (ref 70–99)
Glucose-Capillary: 115 mg/dL — ABNORMAL HIGH (ref 70–99)
Glucose-Capillary: 167 mg/dL — ABNORMAL HIGH (ref 70–99)
Glucose-Capillary: 93 mg/dL (ref 70–99)

## 2021-09-06 LAB — CULTURE, BLOOD (ROUTINE X 2)
Culture: NO GROWTH
Special Requests: ADEQUATE

## 2021-09-06 LAB — CBC
HCT: 29.4 % — ABNORMAL LOW (ref 36.0–46.0)
Hemoglobin: 9.4 g/dL — ABNORMAL LOW (ref 12.0–15.0)
MCH: 29.8 pg (ref 26.0–34.0)
MCHC: 32 g/dL (ref 30.0–36.0)
MCV: 93.3 fL (ref 80.0–100.0)
Platelets: 150 10*3/uL (ref 150–400)
RBC: 3.15 MIL/uL — ABNORMAL LOW (ref 3.87–5.11)
RDW: 21.1 % — ABNORMAL HIGH (ref 11.5–15.5)
WBC: 16.5 10*3/uL — ABNORMAL HIGH (ref 4.0–10.5)
nRBC: 0.8 % — ABNORMAL HIGH (ref 0.0–0.2)

## 2021-09-06 LAB — MAGNESIUM: Magnesium: 2 mg/dL (ref 1.7–2.4)

## 2021-09-06 MED ORDER — SODIUM CHLORIDE 0.9% FLUSH
10.0000 mL | Freq: Two times a day (BID) | INTRAVENOUS | Status: DC
  Administered 2021-09-07 (×3): 10 mL

## 2021-09-06 MED ORDER — SODIUM CHLORIDE 0.9% FLUSH: 10.0000 mL | INTRAVENOUS | Status: DC | PRN

## 2021-09-06 MED ORDER — POTASSIUM CHLORIDE CRYS ER 20 MEQ PO TBCR
40.0000 meq | EXTENDED_RELEASE_TABLET | ORAL | Status: DC
  Filled 2021-09-06: qty 2

## 2021-09-06 MED ORDER — POTASSIUM CHLORIDE 10 MEQ/100ML IV SOLN
10.0000 meq | INTRAVENOUS | Status: AC
  Administered 2021-09-06 (×6): 10 meq via INTRAVENOUS
  Filled 2021-09-06 (×7): qty 100

## 2021-09-06 NOTE — Progress Notes (Signed)
Inpatient Diabetes Program Recommendations  AACE/ADA: New Consensus Statement on Inpatient Glycemic Control (2015)  Target Ranges:  Prepandial:   less than 140 mg/dL      Peak postprandial:   less than 180 mg/dL (1-2 hours)      Critically ill patients:  140 - 180 mg/dL   Lab Results  Component Value Date   GLUCAP 109 (H) 09/06/2021   HGBA1C 7.4 (H) 09/01/2021    Review of Glycemic Control  Latest Reference Range & Units 09/05/21 16:13 09/05/21 19:25 09/05/21 23:59 09/06/21 05:33 09/06/21 09:00  Glucose-Capillary 70 - 99 mg/dL 61 (L) 145 (H) 115 (H) 167 (H) 109 (H)  (L): Data is abnormally low (H): Data is abnormally high Diabetes history: Type 2 DM Outpatient Diabetes medications: none Current orders for Inpatient glycemic control: Novolog 0-9 units Q4H  Inpatient Diabetes Program Recommendations:   Noted mild lows and not requiring frequent amounts of insulin dosing. Consider changing correction to Novolog 0-6 units Q4H.   Thanks, Bronson Curb, MSN, RNC-OB Diabetes Coordinator [phone] (8a-5p)

## 2021-09-06 NOTE — Progress Notes (Signed)
Pt refusing all medications at breakfast and lunch, and finger sticks, and VS this afternoon. Pt is alert and oriented in room. Does not appear in distress. Pt has been educated on need for all above mentioned things, but pt still refusing. Will try again this evening.

## 2021-09-06 NOTE — Progress Notes (Signed)
PROGRESS NOTE    Sandra Jordan  QIW:[id number] DOB: 1951-07-11 DOA: 08/30/2021 PCP: Marinda Elk, MD   Brief Narrative: Taken from prior notes. [name], 70 y.o. 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 Initially admitted with concern of septic shock, requiring pressors, secondary to UTI.  Urine cultures grew strep viridans, blood cultures remain negative.  Completed a course of antibiotics.  Remained very lethargic with poor p.o. intake.  Palliative care was also consulted and family changed the status from full code to DNR.  They want an input from oncology whether she is still a candidate for further chemo or they should look for hospice.  Message sent to Dr. Grayland Ormond.  Last PET scan done in June 21, 2021 showed multiple lung mets but no evidence of other systemic disease except patient's known lesions of brain.  We proceeded with carboplatinum, Taxol and reduced dose of Avastin due to liver dysfunction. Ultrasound done on 07/27/2021 with echogenic liver with hepatic steatosis, s/p cholecystectomy.  Patient is very high risk for deterioration and death secondary to life limiting comorbidities and current failure to thrive.  Subjective: Patient appears very lethargic when seen today.  Denies any complaints.  Does not want to eat or drink.  Assessment & Plan:   Principal Problem:   Generalized weakness Active Problems:   Hypertension   Primary adenocarcinoma of upper lobe of right lung (HCC)   Hypernatremia   AKI (acute kidney injury) ([place])   Dysuria  Severe sepsis with septic  shock secondary to UTI.  POA.  Resolved.  Completed a course of antibiotics.  Urine cultures with strep viridans.  Blood cultures remain negative.  Diarrhea.  C. difficile and GI pathogen negative.  Continue to have diarrhea.  CT abdomen and pelvis was without any acute findings. -Continue with supportive care  Acute hypoxic respiratory failure.  Resolved.  Patient is currently saturating well on room air.  Stage IV lung cancer/failure to thrive.  Most likely secondary to advanced malignancy.  Palliative care was also consulted.  Very poor prognosis.  Family wants to discuss with oncology whether she is a candidate for continuation of chemotherapy.  Message sent to Dr. Grayland Ormond.  Hypokalemia.  Most likely secondary to GI losses.  Magnesium within normal limit. -Replete potassium and monitor  Anemia of chronic disease.  Hemoglobin seems stable. -Continue to monitor.  Chronic decubitus ulcer secondary to pressure injury.  Sacrum and left thigh. -Continue with wound care  Objective: Vitals:   09/06/21 0400 09/06/21 0453 09/06/21 0500 09/06/21 0859  BP:  116/90  106/80  Pulse:  (!) 121  (!) 113  Resp: 18 19 18 18   Temp:  98.6 F (37 C)  98.3 F (36.8 C)  TempSrc:  Oral  Oral  SpO2:  95%  97%  Weight:      Height:        Intake/Output Summary (Last 24 hours) at 09/06/2021 1324 Last data filed at 09/06/2021 0538 Gross per 24 hour  Intake 4344.76 ml  Output 2400 ml  Net 1944.76 ml   Filed Weights   09/03/21 0111 09/04/21 0435 09/05/21 0500  Weight: 53.3 kg 52.1 kg 56.1 kg    Examination:  General exam: Lethargic and malnourished lady, appears calm and comfortable  Respiratory system: Clear to auscultation. Respiratory effort normal. Cardiovascular system: S1 & S2 heard, RRR.  Gastrointestinal system: Soft, nontender, nondistended, bowel sounds positive. Central nervous system: Alert and oriented. No focal neurological deficits. Extremities: No edema, no cyanosis, pulses  intact and symmetrical.  DVT prophylaxis: Heparin Code Status: DNR Family Communication: Talked with nephew on phone.  They want an input from her oncologist whether she is still a candidate for continuation of treatment or not. Disposition Plan:  Status is: Inpatient  Remains inpatient appropriate because: Severity of illness.  Level of care: Telemetry Medical  All the records are reviewed and case discussed with Care Management/Social Worker. Management plans discussed with the patient, nursing and they are in agreement.  Consultants:  PCCM Palliative care Oncology  Procedures:  Antimicrobials:  Ceftriaxone-complete the course today  Data Reviewed: I have personally reviewed following labs and imaging studies  CBC: Recent Labs  Lab 09/02/21 0331 09/03/21 0337 09/04/21 0301 09/05/21 0624 09/06/21 1232  WBC 19.7* 16.5* 13.9* 13.0* 16.5*  HGB 7.8* 8.7* 9.6* 10.5* 9.4*  HCT 25.0* 28.2* 30.9* 33.1* 29.4*  MCV 93.6 96.2 95.7 94.6 93.3  PLT 146* 140* 146* 129* 382   Basic Metabolic Panel: Recent Labs  Lab 08/31/21 2200 09/01/21 0331 09/01/21 0833 09/02/21 0331 09/02/21 1930 09/03/21 0337 09/03/21 2214 09/04/21 0301 09/05/21 0624 09/06/21 1232  NA 153* 151* 148* 159*  --  160* 157* 157* 145 145  K 3.0*  --  3.6 3.2* 3.5 3.3*  --  3.5 3.1* 2.2*  CL 121*  --  118* 128*  --  >130*  --  129* 118* 122*  CO2 26  --  25 24  --  25  --  21* 20* 20*  GLUCOSE 146*  --  111* 99  --  132*  --  127* 125* 320*  BUN 19  --  11 6*  --  6*  --  8 7* 7*  CREATININE 0.86  --  0.87 0.90  --  0.96  --  0.93 0.95 0.76  CALCIUM 7.9*  --  7.8* 8.2*  --  9.1  --  8.7* 8.7* 7.7*  MG  --  1.9  --  1.6*  --  2.8*  --  2.4  --  2.0  PHOS <1.0*  --  2.8 2.8  --  2.2*  --  1.7*  --   --    GFR: Estimated Creatinine Clearance: 56.2 mL/min (by C-G formula based on SCr of 0.76 mg/dL). Liver Function Tests: Recent Labs  Lab 08/31/21 0032 08/31/21 0517 08/31/21 2200 09/01/21 1126  AST  63* 50*  --  36  ALT 135* 119*  --  73*  ALKPHOS 293* 267*  --  192*  BILITOT 1.1 1.0  --  0.7  PROT 6.2* 5.9*  --  4.7*  ALBUMIN 3.0* 2.8* 2.4* 2.1*   No results for input(s): LIPASE, AMYLASE in the last 168 hours. No results for input(s): AMMONIA in the last 168 hours. Coagulation Profile: No results for input(s): INR, PROTIME in the last 168 hours. Cardiac Enzymes: Recent Labs  Lab 08/30/21 2240  CKTOTAL 27*   BNP (last 3 results) No results for input(s): PROBNP in the last 8760 hours. HbA1C: No results for input(s): HGBA1C in the last 72 hours. CBG: Recent Labs  Lab 09/05/21 1613 09/05/21 1925 09/05/21 2359  09/06/21 0533 09/06/21 0900  GLUCAP 61* 145* 115* 167* 109*   Lipid Profile: No results for input(s): CHOL, HDL, LDLCALC, TRIG, CHOLHDL, LDLDIRECT in the last 72 hours. Thyroid Function Tests: No results for input(s): TSH, T4TOTAL, FREET4, T3FREE, THYROIDAB in the last 72 hours. Anemia Panel: No results for input(s): VITAMINB12, FOLATE, FERRITIN, TIBC, IRON, RETICCTPCT in the last 72 hours. Sepsis Labs: Recent Labs  Lab 08/30/21 2240 09/01/21 0938 09/01/21 1126 09/01/21 1409 09/01/21 1504 09/02/21 0331  PROCALCITON 1.28  --   --   --   --  0.89  LATICACIDVEN 1.9 2.3* 3.4* 2.8* 2.9*  --     Recent Results (from the past 240 hour(s))  Resp Panel by RT-PCR (Flu A&B, Covid) Nasopharyngeal Swab     Status: None   Collection Time: 08/30/21 10:40 PM   Specimen: Nasopharyngeal Swab; Nasopharyngeal(NP) swabs in vial transport medium  Result Value Ref Range Status   SARS Coronavirus 2 by RT PCR NEGATIVE NEGATIVE Final    Comment: (NOTE) SARS-CoV-2 target nucleic acids are NOT DETECTED.  The SARS-CoV-2 RNA is generally detectable in upper respiratory specimens during the acute phase of infection. The lowest concentration of SARS-CoV-2 viral copies this assay can detect is 138 copies/mL. A negative result does not preclude SARS-Cov-2 infection and should not  be used as the sole basis for treatment or other patient management decisions. A negative result may occur with  improper specimen collection/handling, submission of specimen other than nasopharyngeal swab, presence of viral mutation(s) within the areas targeted by this assay, and inadequate number of viral copies(<138 copies/mL). A negative result must be combined with clinical observations, patient history, and epidemiological information. The expected result is Negative.  Fact Sheet for Patients:  EntrepreneurPulse.com.au  Fact Sheet for Healthcare Providers:  IncredibleEmployment.be  This test is no t yet approved or cleared by the [place] FDA and  has been authorized for detection and/or diagnosis of SARS-CoV-2 by FDA under an Emergency Use Authorization (EUA). This EUA will remain  in effect (meaning this test can be used) for the duration of the COVID-19 declaration under Section 564(b)(1) of the Act, 21 U.S.C.section 360bbb-3(b)(1), unless the authorization is terminated  or revoked sooner.       Influenza A by PCR NEGATIVE NEGATIVE Final   Influenza B by PCR NEGATIVE NEGATIVE Final    Comment: (NOTE) The Xpert Xpress SARS-CoV-2/FLU/RSV plus assay is intended as an aid in the diagnosis of influenza from Nasopharyngeal swab specimens and should not be used as a sole basis for treatment. Nasal washings and aspirates are unacceptable for Xpert Xpress SARS-CoV-2/FLU/RSV testing.  Fact Sheet for Patients: EntrepreneurPulse.com.au  Fact Sheet for Healthcare Providers: IncredibleEmployment.be  This test is not yet approved or cleared by the [place] FDA and has been authorized for detection and/or diagnosis of SARS-CoV-2 by FDA under an Emergency Use Authorization (EUA). This EUA will remain in effect (meaning this test can be used) for the duration of the COVID-19 declaration under Section  564(b)(1) of the Act, 21 U.S.C. section 360bbb-3(b)(1), unless the authorization is terminated or revoked.  Performed at [facility], [address] [place], [place] [address]   Blood culture (routine x 2)     Status: None   Collection Time: 08/30/21 10:40 PM   Specimen: BLOOD  Result Value Ref Range Status   Specimen Description BLOOD RCHEST PORK  Final   Special Requests   Final    BOTTLES DRAWN AEROBIC AND ANAEROBIC Blood Culture adequate volume  Culture   Final    NO GROWTH 5 DAYS Performed at [facility], [address] [place], [place] [address]    Report Status 09/04/2021 FINAL  Final  Urine Culture     Status: Abnormal   Collection Time: 08/31/21 12:32 AM   Specimen: Urine, Catheterized  Result Value Ref Range Status   Specimen Description   Final    URINE, CATHETERIZED Performed at [facility], [address], [place], [place] [address]    Special Requests   Final    NONE Performed at [facility], [address] [place], [place] [address]    Culture (A)  Final    >=100,000 COLONIES/mL VIRIDANS STREPTOCOCCUS Standardized susceptibility testing for this organism is not available. Performed at [facility], [place] [address] [place], [place] [address]    Report Status 09/01/2021 FINAL  Final  Culture, blood (Routine X 2) w Reflex to ID Panel     Status: None   Collection Time: 09/01/21  8:22 AM   Specimen: BLOOD  Result Value Ref Range Status   Specimen Description BLOOD BLOOD LEFT HAND  Final   Special Requests   Final    BOTTLES DRAWN AEROBIC AND ANAEROBIC Blood Culture adequate volume   Culture   Final    NO GROWTH 5 DAYS Performed at [facility], [address] [place], [place] [address]    Report Status 09/06/2021 FINAL  Final  C Difficile Quick Screen w PCR reflex     Status: None   Collection Time: 09/01/21  2:09 PM   Specimen: STOOL  Result Value Ref Range Status   C Diff antigen  NEGATIVE NEGATIVE Final   C Diff toxin NEGATIVE NEGATIVE Final   C Diff interpretation No C. difficile detected.  Final    Comment: Performed at [facility], [address] [place], [place] [address]  Gastrointestinal Panel by PCR , Stool     Status: None   Collection Time: 09/01/21  2:09 PM   Specimen: Stool  Result Value Ref Range Status   Campylobacter species NOT DETECTED NOT DETECTED Final   Plesimonas shigelloides NOT DETECTED NOT DETECTED Final   Salmonella species NOT DETECTED NOT DETECTED Final   Yersinia enterocolitica NOT DETECTED NOT DETECTED Final   Vibrio species NOT DETECTED NOT DETECTED Final   Vibrio cholerae NOT DETECTED NOT DETECTED Final   Enteroaggregative E coli (EAEC) NOT DETECTED NOT DETECTED Final   Enteropathogenic E coli (EPEC) NOT DETECTED NOT DETECTED Final   Enterotoxigenic E coli (ETEC) NOT DETECTED NOT DETECTED Final   Shiga like toxin producing E coli (STEC) NOT DETECTED NOT DETECTED Final   Shigella/Enteroinvasive E coli (EIEC) NOT DETECTED NOT DETECTED Final   Cryptosporidium NOT DETECTED NOT DETECTED Final   Cyclospora cayetanensis NOT DETECTED NOT DETECTED Final   Entamoeba histolytica NOT DETECTED NOT DETECTED Final   Giardia lamblia NOT DETECTED NOT DETECTED Final   Adenovirus F40/41 NOT DETECTED NOT DETECTED Final   Astrovirus NOT DETECTED NOT DETECTED Final   Norovirus GI/GII NOT DETECTED NOT DETECTED Final   Rotavirus A NOT DETECTED NOT DETECTED Final   Sapovirus (I, II, IV, and V) NOT DETECTED NOT DETECTED Final    Comment: Performed at [facility], [address] [place], [place] [address]  MRSA Next Gen by PCR, Nasal     Status: None   Collection Time: 09/01/21  3:03 PM   Specimen: Nasal Mucosa; Nasal Swab  Result Value Ref  Range Status   MRSA by PCR Next Gen NOT DETECTED NOT DETECTED Final    Comment: (NOTE) The GeneXpert MRSA Assay (FDA approved for NASAL specimens only), is one component of a comprehensive  MRSA colonization surveillance program. It is not intended to diagnose MRSA infection nor to guide or monitor treatment for MRSA infections. Test performance is not FDA approved in patients less than 43 years old. Performed at [facility], [address] [place], [place] [address]      Radiology Studies: No results found.  Scheduled Meds:  Chlorhexidine Gluconate Cloth  6 each Topical Daily   collagenase   Topical Daily   feeding supplement  237 mL Oral TID BM   heparin  5,000 Units Subcutaneous Q8H   insulin aspart  0-9 Units Subcutaneous Q4H   mouth rinse  15 mL Mouth Rinse BID   midodrine  2.5 mg Oral TID WC   multivitamin with minerals  1 tablet Oral Daily   potassium chloride  40 mEq Oral Q2H   Continuous Infusions:  cefTRIAXone (ROCEPHIN)  IV Stopped (09/06/21 0221)   dextrose 100 mL/hr at 09/06/21 0220     LOS: 6 days   Time spent: 43 minutes. More than 50% of the time was spent in counseling/coordination of care  Lorella Nimrod, MD [facility]  If 7PM-7AM, please contact night-coverage [email or web address]  09/06/2021, 1:24 PM   This record has been created using Systems analyst. Errors have been sought and corrected,but may not always be located. Such creation errors do not reflect on the standard of care.

## 2021-09-06 NOTE — Progress Notes (Signed)
Nutrition Follow-up  DOCUMENTATION CODES:   Not applicable  INTERVENTION:   -Continue Ensure Enlive po TID, each supplement provides 350 kcal and 20 grams of protein  -Continue Magic cup TID with meals, each supplement provides 290 kcal and 9 grams of protein  -Continue MVI with minerals daily   NUTRITION DIAGNOSIS:   Increased nutrient needs related to wound healing as evidenced by estimated needs.  Ongoing  GOAL:   Patient will meet greater than or equal to 90% of their needs  Progressing   MONITOR:   PO intake, Supplement acceptance, Diet advancement, Labs, Weight trends, Skin, I & O's  REASON FOR ASSESSMENT:   Malnutrition Screening Tool    ASSESSMENT:   Sandra Jordan is a 70 y.o. female seen in ed with complaints of generalized weakness, lethargy, and dysuria, brought by family, hypernatremia 149 and new AKI.pt denies any dysuria to me. Says she cant eat or drink, brother who si 70 takes care fo her to the best he can.  Reviewed I/O's: +2.1 L x 24 hours and -404 ml since admission  UOP: 2.3 L x 24 hours   Stool output: 125 ml x 24 hours   Pt not very interactive at time of visit. She complained that she was cold; RD adjusted thermostat and covered her in blankets for comfort.   Pt reports poor oral intake PTA but unable to provide further details. Noted lunch tray untouched. Per RN, pt has been refusing, care, meals, and medications today. Noted meal completions 0-90%. Offered items from lunch tray, which pt refused. Pt took a few sips of ice water from this RD without difficulty.   Palliative care following for goals of care discussions; plan input for oncology to see if pt is a candidate for further treatments or if hospice will be pursued.   Medications reviewed and include dextrose 5% solution and runs of KCl.   Labs reviewed: CBGS: 61-167 (inpatient orders for glycemic control are 0-9 units insulin aspart every 4 hours).    Diet Order:   Diet  Order             DIET - DYS 1 Room service appropriate? Yes; Fluid consistency: Thin  Diet effective now                   EDUCATION NEEDS:   No education needs have been identified at this time  Skin:  Skin Assessment: Skin Integrity Issues: Skin Integrity Issues:: Unstageable, Other (Comment) Unstageable: sacrum Other: full thickness wound to rt groin  Last BM:  09/05/21  Height:   Ht Readings from Last 1 Encounters:  08/30/21 5' 3.5" (1.613 m)    Weight:   Wt Readings from Last 1 Encounters:  09/05/21 56.1 kg    Ideal Body Weight:  53.4 kg  BMI:  Body mass index is 21.56 kg/m.  Estimated Nutritional Needs:   Kcal:  1800-2000  Protein:  100-115 grams  Fluid:  > 1.8 L    Loistine Chance, RD, LDN, [place] Registered Dietitian II Certified Diabetes Care and Education Specialist Please refer to [facility] for RD and/or RD on-call/weekend/after hours pager

## 2021-09-06 NOTE — Progress Notes (Signed)
Critical potassium level of 2.2 given to me by lab. MD notified

## 2021-09-07 DIAGNOSIS — R531 Weakness: Secondary | ICD-10-CM

## 2021-09-07 LAB — GLUCOSE, CAPILLARY
Glucose-Capillary: 106 mg/dL — ABNORMAL HIGH (ref 70–99)
Glucose-Capillary: 134 mg/dL — ABNORMAL HIGH (ref 70–99)
Glucose-Capillary: 139 mg/dL — ABNORMAL HIGH (ref 70–99)
Glucose-Capillary: 74 mg/dL (ref 70–99)
Glucose-Capillary: 95 mg/dL (ref 70–99)
Glucose-Capillary: 96 mg/dL (ref 70–99)

## 2021-09-07 LAB — BASIC METABOLIC PANEL
BUN: 9 mg/dL (ref 8–23)
BUN: 9 mg/dL (ref 8–23)
BUN: 9 mg/dL (ref 8–23)
CO2: 19 mmol/L — ABNORMAL LOW (ref 22–32)
CO2: 21 mmol/L — ABNORMAL LOW (ref 22–32)
CO2: 19 mmol/L — ABNORMAL LOW (ref 22–32)
Calcium: 9 mg/dL (ref 8.9–10.3)
Calcium: 9.5 mg/dL (ref 8.9–10.3)
Calcium: 9.2 mg/dL (ref 8.9–10.3)
Chloride: >130 mmol/L (ref 98–111)
Chloride: >130 mmol/L (ref 98–111)
Chloride: >130 mmol/L (ref 98–111)
Creatinine, Ser: 0.81 mg/dL (ref 0.44–1.00)
Creatinine, Ser: 0.87 mg/dL (ref 0.44–1.00)
Creatinine, Ser: 0.86 mg/dL (ref 0.44–1.00)
GFR, Estimated: >60 mL/min (ref >60)
GFR, Estimated: >60 mL/min (ref >60)
GFR, Estimated: >60 mL/min (ref >60)
Glucose, Bld: 96 mg/dL (ref 70–99)
Glucose, Bld: 122 mg/dL — ABNORMAL HIGH (ref 70–99)
Glucose, Bld: 158 mg/dL — ABNORMAL HIGH (ref 70–99)
Potassium: 3.5 mmol/L (ref 3.5–5.1)
Potassium: 3.2 mmol/L — ABNORMAL LOW (ref 3.5–5.1)
Potassium: 3.2 mmol/L — ABNORMAL LOW (ref 3.5–5.1)
Sodium: 163 mmol/L (ref 135–145)
Sodium: 166 mmol/L (ref 135–145)
Sodium: 166 mmol/L (ref 135–145)

## 2021-09-07 MED ORDER — METOPROLOL TARTRATE 5 MG/5ML IV SOLN
5.0000 mg | INTRAVENOUS | Status: DC | PRN
  Administered 2021-09-07 – 2021-09-08 (×3): 5 mg via INTRAVENOUS
  Filled 2021-09-07 (×3): qty 5

## 2021-09-07 MED ORDER — MORPHINE SULFATE (PF) 2 MG/ML IV SOLN
1.0000 mg | INTRAVENOUS | Status: DC | PRN
  Administered 2021-09-07 – 2021-09-08 (×5): 1 mg via INTRAVENOUS
  Filled 2021-09-07 (×5): qty 1

## 2021-09-07 MED ORDER — DEXTROSE 5 % IV SOLN
INTRAVENOUS | Status: DC
Start: 2021-09-07 — End: 2021-09-08

## 2021-09-07 MED ORDER — LACTATED RINGERS IV BOLUS
250.0000 mL | Freq: Once | INTRAVENOUS | Status: AC
  Administered 2021-09-07: 02:00:00 250 mL via INTRAVENOUS

## 2021-09-07 MED ORDER — LORAZEPAM 2 MG/ML IJ SOLN
1.0000 mg | Freq: Four times a day (QID) | INTRAMUSCULAR | Status: DC | PRN
  Administered 2021-09-07 – 2021-09-08 (×2): 1 mg via INTRAVENOUS
  Filled 2021-09-07 (×2): qty 1

## 2021-09-07 NOTE — Progress Notes (Signed)
Lab called with critical lab values of chloride >130 and sodium of 166. Notified Dr. Reesa Chew at [phone] via secure chat. Patient is resting in bed, not saying much very withdrawn. Fluids are infusing into port Right upper chest. Bed in low position and call bell in reach.

## 2021-09-07 NOTE — Consult Note (Signed)
[place]  [phone] [phone]  ID: Sandra Jordan OB: July 21, 1951  MR#: [id number]  ZCH#:[id number]  Patient Care Team: Marinda Elk, MD as PCP - General (Physician Assistant) Christene Lye, MD as Consulting Physician (General Surgery) Ellamae Sia, MD (Inactive) as Referring Physician (Internal Medicine) Telford Nab, RN as Oncology Nurse Navigator  CHIEF COMPLAINT: Stage IVa adenocarcinoma of the right upper lobe lung with innumerable brain metastasis.  Now with failure to thrive.  INTERVAL HISTORY: Patient is a 70 year old female who was admitted to the hospital approximately 1 week ago with generalized weakness, lethargy, and UTI.  She was found to have significant electrolyte derangements which is only mildly improved since admission.  Her performance status continues to decline and family is considering hospice.  Currently she is obtunded and review of systems is difficult to obtain.  There are no family members at bedside.  REVIEW OF SYSTEMS:   Review of Systems  Unable to perform ROS: Medical condition    PAST MEDICAL HISTORY: Past Medical History:  Diagnosis Date   Allergy    Colon polyp    Hyperlipidemia    Hypertension    Lung cancer ([place])     PAST SURGICAL HISTORY: Past Surgical History:  Procedure Laterality Date   ABDOMINAL HYSTERECTOMY  1984   BREAST BIOPSY Bilateral 1990   fibrocystic breast   BREAST BIOPSY Right August 2014   fibrocystic breast   CHOLECYSTECTOMY  2008   COLONOSCOPY  2012, 2017   [facility]   IR CV LINE INJECTION  07/07/2021   IR IMAGING GUIDED PORT INSERTION  06/20/2021   IR IMAGING GUIDED PORT INSERTION  07/24/2021    FAMILY HISTORY: Family History  Problem Relation Age of Onset   Lung cancer Mother    Colon cancer Father 76   Prostate cancer Father    Lung cancer Sister    Lung cancer Maternal Uncle    Stomach cancer Maternal Grandfather    Breast cancer Niece      ADVANCED DIRECTIVES (Y/N):  @ADVDIR @  HEALTH MAINTENANCE: Social History   Tobacco Use   Smoking status: Former    Years: 15.00    Types: Cigarettes    Quit date: 10/08/2004    Years since quitting: 16.9   Smokeless tobacco: Never  Substance Use Topics   Alcohol use: No    Alcohol/week: 0.0 standard drinks   Drug use: No     Colonoscopy:  PAP:  Bone density:  Lipid panel:  No Known Allergies  Current Facility-Administered Medications  Medication Dose Route Frequency Provider Last Rate Last Admin   acetaminophen (TYLENOL) tablet 650 mg  650 mg Oral Q6H PRN Para Skeans, MD       Or   acetaminophen (TYLENOL) suppository 650 mg  650 mg Rectal Q6H PRN Para Skeans, MD   650 mg at 09/01/21 1602   Chlorhexidine Gluconate Cloth 2 % PADS 6 each  6 each Topical Daily Para Skeans, MD   6 each at 09/07/21 2878   collagenase (SANTYL) ointment   Topical Daily Antonieta Pert, MD   Given by Other at 09/07/21 0924   dextrose 5 % solution   Intravenous Continuous Sharion Settler, NP 150 mL/hr at 09/07/21 1153 New Bag at 09/07/21 1153   feeding supplement (ENSURE ENLIVE / ENSURE PLUS) liquid 237 mL  237 mL Oral TID BM Kc, Maren Beach, MD   237 mL at 09/05/21 0821   heparin injection 5,000 Units  5,000 Units Subcutaneous Q8H Para Skeans, MD   5,000 Units at 09/07/21 0516   insulin aspart (novoLOG) injection 0-9 Units  0-9 Units Subcutaneous Q4H Rust-Chester, Huel Cote, NP   1 Units at 09/05/21 0530   loperamide (IMODIUM) capsule 2 mg  2 mg Oral PRN Antonieta Pert, MD   2 mg at 09/04/21 1308   MEDLINE mouth rinse  15 mL Mouth Rinse BID Rust-Chester, Toribio Harbour L, NP   15 mL at 09/05/21 0821   metoprolol tartrate (LOPRESSOR) injection 5 mg  5 mg Intravenous Q2H PRN Lorella Nimrod, MD   5 mg at 09/07/21 0946   midodrine (PROAMATINE) tablet 2.5 mg  2.5 mg Oral TID WC Ottie Glazier, MD   2.5 mg at 09/05/21 1302   multivitamin with minerals tablet 1 tablet  1 tablet Oral Daily Antonieta Pert, MD   1 tablet  at 09/05/21 0820   sodium chloride flush (NS) 0.9 % injection 10-40 mL  10-40 mL Intracatheter Q12H Lorella Nimrod, MD   10 mL at 09/07/21 0925   sodium chloride flush (NS) 0.9 % injection 10-40 mL  10-40 mL Intracatheter PRN Lorella Nimrod, MD       Facility-Administered Medications Ordered in Other Encounters  Medication Dose Route Frequency Provider Last Rate Last Admin   heparin lock flush 100 unit/mL  500 Units Intravenous Once Borders, Kirt Boys, NP        OBJECTIVE: Vitals:   09/07/21 0741 09/07/21 1137  BP: 112/67 108/77  Pulse: (!) 120 (!) 103  Resp: 20 17  Temp: 98.3 F (36.8 C) 98.4 F (36.9 C)  SpO2: 90% 98%     Body mass index is 19.49 kg/m.    ECOG FS:4 - Bedbound  General: Critically ill-appearing. Eyes: Pink conjunctiva, anicteric sclera. HEENT: Normocephalic, moist mucous membranes. Lungs: No audible wheezing or coughing. Heart: Regular rate and rhythm. Abdomen: Soft, nontender, no obvious distention. Musculoskeletal: No edema, cyanosis, or clubbing. Neuro: Confused. Cranial nerves grossly intact. Skin: No rashes or petechiae noted.   LAB RESULTS:  Lab Results  Component Value Date   NA 166 (HH) 09/07/2021   K 3.2 (L) 09/07/2021   CL >130 (HH) 09/07/2021   CO2 21 (L) 09/07/2021   GLUCOSE 122 (H) 09/07/2021   BUN 9 09/07/2021   CREATININE 0.87 09/07/2021   CALCIUM 9.5 09/07/2021   PROT 4.7 (L) 09/01/2021   ALBUMIN 2.1 (L) 09/01/2021   AST 36 09/01/2021   ALT 73 (H) 09/01/2021   ALKPHOS 192 (H) 09/01/2021   BILITOT 0.7 09/01/2021   GFRNONAA >60 09/07/2021   GFRAA >60 01/26/2013    Lab Results  Component Value Date   WBC 16.5 (H) 09/06/2021   NEUTROABS 26.5 (H) 08/23/2021   HGB 9.4 (L) 09/06/2021   HCT 29.4 (L) 09/06/2021   MCV 93.3 09/06/2021   PLT 150 09/06/2021     STUDIES: CT Head Wo Contrast  Result Date: 08/30/2021 CLINICAL DATA:  Altered mental status EXAM: CT HEAD WITHOUT CONTRAST TECHNIQUE: Contiguous axial images were obtained  from the base of the skull through the vertex without intravenous contrast. COMPARISON:  CT head 06/13/2021 BRAIN: BRAIN Cerebral ventricle sizes are concordant with the degree of cerebral volume loss. Patchy and confluent areas of decreased attenuation are noted throughout the deep and periventricular white matter of the cerebral hemispheres bilaterally, compatible with chronic microvascular ischemic disease. No evidence of large-territorial acute infarction. No parenchymal hemorrhage. No mass lesion. No extra-axial collection. No mass effect or midline shift. No  hydrocephalus. Basilar cisterns are patent. Vascular: No hyperdense vessel. Skull: No acute fracture or focal lesion. Sinuses/Orbits: Paranasal sinuses and mastoid air cells are clear. The orbits are unremarkable. Other: None. IMPRESSION: No acute intracranial abnormality. Electronically Signed   By: Iven Finn M.D.   On: 08/30/2021 23:03   CT ABDOMEN PELVIS W CONTRAST  Result Date: 09/04/2021 CLINICAL DATA:  70 year old female with history of abdominal pain. Fever. EXAM: CT ABDOMEN AND PELVIS WITH CONTRAST TECHNIQUE: Multidetector CT imaging of the abdomen and pelvis was performed using the standard protocol following bolus administration of intravenous contrast. CONTRAST:  27mL OMNIPAQUE IOHEXOL 300 MG/ML  SOLN COMPARISON:  CT the chest, abdomen and pelvis 06/12/2021. FINDINGS: Lower chest: Trace bilateral pleural effusions lying dependently with some minimal passive subsegmental atelectasis in the lower lobes of the lungs bilaterally. Hepatobiliary: No suspicious cystic or solid hepatic lesions are confidently identified on today's noncontrast CT examination. Status post cholecystectomy. Pancreas: No pancreatic mass. No pancreatic ductal dilatation. No pancreatic or peripancreatic fluid collections or inflammatory changes. Spleen: Unremarkable. Adrenals/Urinary Tract: In the lower pole collecting system of the left kidney there is a 7 mm  nonobstructive calculus. In the upper pole of the right kidney there is a subcentimeter low-attenuation lesion, too small to characterize, but statistically likely to represent a cyst. Bilateral adrenal glands are normal in appearance. No hydroureteronephrosis. Urinary bladder is completely decompressed around an indwelling Foley balloon catheter. Stomach/Bowel: The appearance of the stomach is normal. No pathologic dilatation of small bowel or colon. Rectal bag in position. Normal appendix. Vascular/Lymphatic: Aortic atherosclerosis, without evidence of aneurysm or dissection in the abdominal or pelvic vasculature. No lymphadenopathy noted in the abdomen or pelvis. Reproductive: Status post hysterectomy. Ovaries are not confidently identified may be surgically absent or atrophic. Other: No significant volume of ascites.  No pneumoperitoneum. Musculoskeletal: There are no aggressive appearing lytic or blastic lesions noted in the visualized portions of the skeleton. IMPRESSION: 1. No acute findings are noted in the abdomen or pelvis to account for the patient's symptoms. 2. Trace bilateral pleural effusions. 3. 7 mm nonobstructive calculus in the lower pole collecting system of left kidney. No ureteral stones or findings of urinary tract obstruction are noted at this time. 4. Aortic atherosclerosis. 5. Additional incidental findings, as above. Electronically Signed   By: Vinnie Langton M.D.   On: 09/04/2021 12:07   DG Chest Portable 1 View  Result Date: 08/31/2021 CLINICAL DATA:  Weakness and possible sepsis EXAM: PORTABLE CHEST 1 VIEW COMPARISON:  06/12/2021 FINDINGS: Cardiac shadow is within normal limits. Right chest wall port is now seen in satisfactory position. Previously seen consolidation in the right upper lobe has nearly completely resolved although some minimal residual opacity remains. No bony abnormality is noted. IMPRESSION: Significant improved aeration in the right upper lobe although some  persistent infiltrate remains. Electronically Signed   By: Inez Catalina M.D.   On: 08/31/2021 00:53    ASSESSMENT: Stage IVa adenocarcinoma of the right upper lobe lung with innumerable brain metastasis.  Now with failure to thrive.  PLAN:    Stage IVa adenocarcinoma of the right upper lobe lung with innumerable brain metastasis: Patient last had chemotherapy with carboplatinum, Taxol, and Avastin approximately 3 weeks ago.  Given patient's medical condition and declining performance status, no further chemotherapy is planned at this time.  Family is considering hospice which would be appropriate.  No family is at bedside to further discuss.  Appreciate palliative care input. UTI with sepsis: Essentially resolved.  Patient has now completed antibiotics.  Blood cultures remain negative. Diarrhea: C. difficile negative.  Imaging without any acute pathology. Electrolyte derangement: Patient has a significantly elevated sodium level and decreased potassium level.  Multifactorial.  Continue supportive care. Confusion: Possibly secondary to elevated sodium level. Disposition: Patient will likely require discharge with hospice.  Will attempt to call family in the morning to further discuss.  Appreciate consult, will follow.    Lloyd Huger, MD   09/07/2021 12:08 PM

## 2021-09-07 NOTE — Progress Notes (Signed)
PROGRESS NOTE    Sandra Jordan  BPZ:[id number] DOB: 02-09-1951 DOA: 08/30/2021 PCP: Marinda Elk, MD   Brief Narrative: Taken from prior notes. Sandra Jordan, 70 y.o. 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 Initially admitted with concern of septic shock, requiring pressors, secondary to UTI.  Urine cultures grew strep viridans, blood cultures remain negative.  Completed a course of antibiotics.  Remained very lethargic with poor p.o. intake.  Palliative care was also consulted and family changed the status from full code to DNR.  They want an input from oncology whether she is still a candidate for further chemo or they should look for hospice.  Message sent to Dr. Grayland Ormond.  Last PET scan done in June 21, 2021 showed multiple lung mets but no evidence of other systemic disease except patient's known lesions of brain.  We proceeded with carboplatinum, Taxol and reduced dose of Avastin due to liver dysfunction. Ultrasound done on 07/27/2021 with echogenic liver with hepatic steatosis, s/p cholecystectomy.  Patient is very high risk for deterioration and death secondary to life limiting comorbidities and current failure to thrive.  12/1: Patient continued to refuse all the p.o. intake, becoming more lethargic and not following any commands.  Appears more confused.  Family is interested in pursuing hospice home, awaiting final recommendations from her oncologist.  Message sent to Dr. Grayland Ormond yesterday, most likely will see her today.  Subjective: Patient appears more confused, not talking or  following any commands.  She is awake and moving all extremities spontaneously.  Refusing all the p.o. intake.  Assessment & Plan:   Principal Problem:   Generalized weakness Active Problems:   Hypertension   Primary adenocarcinoma of upper lobe of right lung (HCC)   Hypernatremia   AKI (acute kidney injury) ([place])   Dysuria  Severe sepsis with septic shock secondary to UTI.  POA.  Resolved.  Completed a course of antibiotics.  Urine cultures with strep viridans.  Blood cultures remain negative.  Diarrhea.  C. difficile and GI pathogen negative.  Continue to have diarrhea.  CT abdomen and pelvis was without any acute findings. -Continue with supportive care  Acute hypoxic respiratory failure.  Resolved.  Patient is currently saturating well on room air.  Stage IV lung cancer/failure to thrive.  Most likely secondary to advanced malignancy.  Palliative care was also consulted.  Very poor prognosis.  Family wants to discuss with oncology whether she is a candidate for continuation of chemotherapy.  Message sent to Dr. Grayland Ormond yesterday and who will see her today.  Family is interested in hospice home, no bed available today.  Hypokalemia.  Most likely secondary to GI losses.  Magnesium within normal limit. -Replete potassium and monitor  Hypernatremia.  Most likely secondary to poor p.o. intake. -Started on D5  Anemia of chronic disease.  Hemoglobin seems stable. -Continue to monitor.  Chronic decubitus ulcer secondary to pressure injury.  Sacrum and left thigh. -Continue with wound care  Objective: Vitals:   09/07/21 0600 09/07/21 0741 09/07/21 1137 09/07/21 1524  BP:  112/67 108/77 131/78  Pulse:  (!) 120 (!) 103 (!) 103  Resp:  20 17 18   Temp:  98.3 F (36.8 C) 98.4 F (36.9 C)   TempSrc:  Axillary Axillary   SpO2:  90% 98%   Weight: 50.7 kg     Height:        Intake/Output Summary (Last 24 hours) at 09/07/2021 1628 Last data filed at 09/07/2021 1200 Gross per 24  hour  Intake 250 ml  Output 2990 ml  Net -2740 ml    Filed Weights   09/04/21 0435 09/05/21 0500 09/07/21 0600  Weight: 52.1 kg 56.1 kg 50.7 kg    Examination:  General.  Confused appearing and malnourished lady, in no acute distress. Pulmonary.  Lungs clear bilaterally, normal respiratory effort. CV.  Regular rate and rhythm, no JVD, rub or murmur. Abdomen.  Soft, nontender, nondistended, BS positive. CNS.  Awake but not following any commands, spontaneously moving all the extremities. Extremities.  No edema, no cyanosis, pulses intact and symmetrical. Psychiatry.  Judgment and insight appears impaired.  DVT prophylaxis: Heparin Code Status: DNR Family Communication:  Disposition Plan:  Status is: Inpatient  Remains inpatient appropriate because: Severity of illness.  Level of care: Telemetry Medical  All the records are reviewed and case discussed with Care Management/Social Worker. Management plans discussed with the patient, nursing and they are in agreement.  Consultants:  PCCM Palliative care Oncology  Procedures:  Antimicrobials:  Ceftriaxone-complete the course.  Data Reviewed: I have personally reviewed following labs and imaging studies  CBC: Recent Labs  Lab 09/02/21 0331 09/03/21 0337 09/04/21 0301 09/05/21 0624 09/06/21 1232  WBC 19.7* 16.5* 13.9* 13.0* 16.5*  HGB 7.8* 8.7* 9.6* 10.5* 9.4*  HCT 25.0* 28.2* 30.9* 33.1* 29.4*  MCV 93.6 96.2 95.7 94.6 93.3  PLT 146* 140* 146* 129* 952    Basic Metabolic Panel: Recent Labs  Lab 08/31/21 2200 09/01/21 0331 09/01/21 0833 09/02/21 0331 09/02/21 1930 09/03/21 0337 09/03/21 2214 09/04/21 0301 09/05/21 0624 09/06/21 1232 09/07/21 0456 09/07/21 0956 09/07/21 1334  NA 153* 151* 148* 159*  --  160*   < > 157* 145 145 163* 166* 166*  K 3.0*  --  3.6 3.2*   < > 3.3*  --  3.5 3.1* 2.2* 3.5 3.2* 3.2*  CL 121*  --  118* 128*  --  >130*  --  129* 118* 122* >130* >130* >130*  CO2 26  --  25 24   --  25  --  21* 20* 20* 19* 21* 19*  GLUCOSE 146*  --  111* 99  --  132*  --  127* 125* 320* 96 122* 158*  BUN 19  --  11 6*  --  6*  --  8 7* 7* 9 9 9   CREATININE 0.86  --  0.87 0.90  --  0.96  --  0.93 0.95 0.76 0.81 0.87 0.86  CALCIUM 7.9*  --  7.8* 8.2*  --  9.1  --  8.7* 8.7* 7.7* 9.0 9.5 9.2  MG  --  1.9  --  1.6*  --  2.8*  --  2.4  --  2.0  --   --   --   PHOS <1.0*  --  2.8 2.8  --  2.2*  --  1.7*  --   --   --   --   --    < > = values in this interval not displayed.    GFR: Estimated Creatinine Clearance: 49.4 mL/min (by C-G formula based on SCr of 0.86 mg/dL). Liver Function  Tests: Recent Labs  Lab 08/31/21 2200 09/01/21 1126  AST  --  36  ALT  --  73*  ALKPHOS  --  192*  BILITOT  --  0.7  PROT  --  4.7*  ALBUMIN 2.4* 2.1*    No results for input(s): LIPASE, AMYLASE in the last 168 hours. No results for input(s): AMMONIA in the last 168 hours. Coagulation Profile: No results for input(s): INR, PROTIME in the last 168 hours. Cardiac Enzymes: No results for input(s): CKTOTAL, CKMB, CKMBINDEX, TROPONINI in the last 168 hours.  BNP (last 3 results) No results for input(s): PROBNP in the last 8760 hours. HbA1C: No results for input(s): HGBA1C in the last 72 hours. CBG: Recent Labs  Lab 09/06/21 2054 09/07/21 0030 09/07/21 0437 09/07/21 0743 09/07/21 1137  GLUCAP 93 96 95 74 139*    Lipid Profile: No results for input(s): CHOL, HDL, LDLCALC, TRIG, CHOLHDL, LDLDIRECT in the last 72 hours. Thyroid Function Tests: No results for input(s): TSH, T4TOTAL, FREET4, T3FREE, THYROIDAB in the last 72 hours. Anemia Panel: No results for input(s): VITAMINB12, FOLATE, FERRITIN, TIBC, IRON, RETICCTPCT in the last 72 hours. Sepsis Labs: Recent Labs  Lab 09/01/21 0109 09/01/21 1126 09/01/21 1409 09/01/21 1504 09/02/21 0331  PROCALCITON  --   --   --   --  0.89  LATICACIDVEN 2.3* 3.4* 2.8* 2.9*  --      Recent Results (from the past 240 hour(s))  Resp Panel by  RT-PCR (Flu A&B, Covid) Nasopharyngeal Swab     Status: None   Collection Time: 08/30/21 10:40 PM   Specimen: Nasopharyngeal Swab; Nasopharyngeal(NP) swabs in vial transport medium  Result Value Ref Range Status   SARS Coronavirus 2 by RT PCR NEGATIVE NEGATIVE Final    Comment: (NOTE) SARS-CoV-2 target nucleic acids are NOT DETECTED.  The SARS-CoV-2 RNA is generally detectable in upper respiratory specimens during the acute phase of infection. The lowest concentration of SARS-CoV-2 viral copies this assay can detect is 138 copies/mL. A negative result does not preclude SARS-Cov-2 infection and should not be used as the sole basis for treatment or other patient management decisions. A negative result may occur with  improper specimen collection/handling, submission of specimen other than nasopharyngeal swab, presence of viral mutation(s) within the areas targeted by this assay, and inadequate number of viral copies(<138 copies/mL). A negative result must be combined with clinical observations, patient history, and epidemiological information. The expected result is Negative.  Fact Sheet for Patients:  EntrepreneurPulse.com.au  Fact Sheet for Healthcare Providers:  IncredibleEmployment.be  This test is no t yet approved or cleared by the [place] FDA and  has been authorized for detection and/or diagnosis of SARS-CoV-2 by FDA under an Emergency Use Authorization (EUA). This EUA will remain  in effect (meaning this test can be used) for the duration of the COVID-19 declaration under Section 564(b)(1) of the Act, 21 U.S.C.section 360bbb-3(b)(1), unless the authorization is terminated  or revoked sooner.       Influenza A by PCR NEGATIVE NEGATIVE Final   Influenza B by PCR NEGATIVE NEGATIVE Final    Comment: (NOTE) The Xpert Xpress SARS-CoV-2/FLU/RSV plus assay is intended as an aid in the diagnosis of influenza from Nasopharyngeal swab  specimens and should not be used as a sole basis for treatment. Nasal washings and aspirates are unacceptable for Xpert Xpress SARS-CoV-2/FLU/RSV testing.  Fact Sheet for Patients: EntrepreneurPulse.com.au  Fact Sheet for Healthcare Providers: IncredibleEmployment.be  This test is not yet approved or cleared  by the [place] and has been authorized for detection and/or diagnosis of SARS-CoV-2 by FDA under an Emergency Use Authorization (EUA). This EUA will remain in effect (meaning this test can be used) for the duration of the COVID-19 declaration under Section 564(b)(1) of the Act, 21 U.S.C. section 360bbb-3(b)(1), unless the authorization is terminated or revoked.  Performed at [facility], [address] [place], [place] [address]   Blood culture (routine x 2)     Status: None   Collection Time: 08/30/21 10:40 PM   Specimen: BLOOD  Result Value Ref Range Status   Specimen Description BLOOD RCHEST PORK  Final   Special Requests   Final    BOTTLES DRAWN AEROBIC AND ANAEROBIC Blood Culture adequate volume   Culture   Final    NO GROWTH 5 DAYS Performed at [facility], [address], [place], [place] [address]    Report Status 09/04/2021 FINAL  Final  Urine Culture     Status: Abnormal   Collection Time: 08/31/21 12:32 AM   Specimen: Urine, Catheterized  Result Value Ref Range Status   Specimen Description   Final    URINE, CATHETERIZED Performed at [facility], [address] [place], [place] [address]    Special Requests   Final    NONE Performed at [facility], [address], [place], [place] [address]    Culture (A)  Final    >=100,000 COLONIES/mL VIRIDANS STREPTOCOCCUS Standardized susceptibility testing for this organism is not available. Performed at [facility], [place] [address], [place], [place] [address]    Report Status 09/01/2021 FINAL  Final  Culture, blood  (Routine X 2) w Reflex to ID Panel     Status: None   Collection Time: 09/01/21  8:22 AM   Specimen: BLOOD  Result Value Ref Range Status   Specimen Description BLOOD BLOOD LEFT HAND  Final   Special Requests   Final    BOTTLES DRAWN AEROBIC AND ANAEROBIC Blood Culture adequate volume   Culture   Final    NO GROWTH 5 DAYS Performed at [facility], [address] [place], [place] [address]    Report Status 09/06/2021 FINAL  Final  C Difficile Quick Screen w PCR reflex     Status: None   Collection Time: 09/01/21  2:09 PM   Specimen: STOOL  Result Value Ref Range Status   C Diff antigen NEGATIVE NEGATIVE Final   C Diff toxin NEGATIVE NEGATIVE Final   C Diff interpretation No C. difficile detected.  Final    Comment: Performed at [facility], [address] [place],  [address]  Gastrointestinal Panel by PCR , Stool     Status: None   Collection Time: 09/01/21  2:09 PM   Specimen: Stool  Result Value Ref Range Status   Campylobacter species NOT DETECTED NOT DETECTED Final   Plesimonas shigelloides NOT DETECTED NOT DETECTED Final   Salmonella species NOT DETECTED NOT DETECTED Final   Yersinia enterocolitica NOT DETECTED NOT DETECTED Final   Vibrio species NOT DETECTED NOT DETECTED Final   Vibrio cholerae NOT DETECTED NOT DETECTED Final   Enteroaggregative E coli (EAEC) NOT DETECTED NOT DETECTED Final   Enteropathogenic E coli (EPEC) NOT DETECTED NOT DETECTED Final   Enterotoxigenic E coli (ETEC) NOT DETECTED NOT DETECTED Final   Shiga like toxin producing E coli (STEC) NOT DETECTED NOT DETECTED Final   Shigella/Enteroinvasive E coli (EIEC) NOT DETECTED NOT DETECTED  Final   Cryptosporidium NOT DETECTED NOT DETECTED Final   Cyclospora cayetanensis NOT DETECTED NOT DETECTED Final   Entamoeba histolytica NOT DETECTED NOT DETECTED Final   Giardia lamblia NOT DETECTED NOT DETECTED Final   Adenovirus F40/41 NOT DETECTED NOT DETECTED Final   Astrovirus NOT  DETECTED NOT DETECTED Final   Norovirus GI/GII NOT DETECTED NOT DETECTED Final   Rotavirus A NOT DETECTED NOT DETECTED Final   Sapovirus (I, II, IV, and V) NOT DETECTED NOT DETECTED Final    Comment: Performed at [facility], [address] [place], [place] [address]  MRSA Next Gen by PCR, Nasal     Status: None   Collection Time: 09/01/21  3:03 PM   Specimen: Nasal Mucosa; Nasal Swab  Result Value Ref Range Status   MRSA by PCR Next Gen NOT DETECTED NOT DETECTED Final    Comment: (NOTE) The GeneXpert MRSA Assay (FDA approved for NASAL specimens only), is one component of a comprehensive MRSA colonization surveillance program. It is not intended to diagnose MRSA infection nor to guide or monitor treatment for MRSA infections. Test performance is not FDA approved in patients less than 70 years old. Performed at [facility], [address], [place], [place] [address]       Radiology Studies: No results found.  Scheduled Meds:  Chlorhexidine Gluconate Cloth  6 each Topical Daily   collagenase   Topical Daily   feeding supplement  237 mL Oral TID BM   heparin  5,000 Units Subcutaneous Q8H   insulin aspart  0-9 Units Subcutaneous Q4H   mouth rinse  15 mL Mouth Rinse BID   midodrine  2.5 mg Oral TID WC   multivitamin with minerals  1 tablet Oral Daily   sodium chloride flush  10-40 mL Intracatheter Q12H   Continuous Infusions:  dextrose 150 mL/hr at 09/07/21 1153     LOS: 7 days   Time spent: 40 minutes. More than 50% of the time was spent in counseling/coordination of care  Lorella Nimrod, MD [facility]  If 7PM-7AM, please contact night-coverage [email or web address]  09/07/2021, 4:28 PM   This record has been created using Systems analyst. Errors have been sought and corrected,but may not always be located. Such creation errors do not reflect on the standard of care.

## 2021-09-07 NOTE — Progress Notes (Signed)
   09/07/21 2118  Assess: MEWS Score  Temp 98.7 F (37.1 C)  BP 108/81  Pulse Rate (!) 121  Resp 19  SpO2 97 %  O2 Device Room Air  Assess: MEWS Score  MEWS Temp 0  MEWS Systolic 0  MEWS Pulse 2  MEWS RR 0  MEWS LOC 0  MEWS Score 2  MEWS Score Color Yellow  Assess: if the MEWS score is Yellow or Red  Were vital signs taken at a resting state? Yes  Focused Assessment No change from prior assessment  Does the patient meet 2 or more of the SIRS criteria? No  Does the patient have a confirmed or suspected source of infection? No  Provider and Rapid Response Notified? No  MEWS guidelines implemented *See Row Information* Yes  Take Vital Signs  Increase Vital Sign Frequency  Yellow: Q 2hr X 2 then Q 4hr X 2, if remains yellow, continue Q 4hrs  Escalate  MEWS: Escalate Yellow: discuss with charge nurse/RN and consider discussing with provider and RRT  Notify: Charge Nurse/RN  Name of Charge Nurse/RN Notified Siri Cole, RN  Date Charge Nurse/RN Notified 09/07/21  Time Charge Nurse/RN Notified 2127  Assess: SIRS CRITERIA  SIRS Temperature  0  SIRS Pulse 1  SIRS Respirations  0  SIRS WBC 0  SIRS Score Sum  1  PRN medication given will continue to monitor per MEWs guidelines

## 2021-09-07 NOTE — TOC Initial Note (Signed)
Transition of Care [facility]) - Initial/Assessment Note    Patient Details  Name: Sandra Jordan MRN: [id number] Date of Birth: 06-09-1951  [facility] CM/SW Contact:    Pete Pelt, RN Phone Number: 09/07/2021, 2:21 PM  Clinical Narrative:      Patient seen by Palliative Care, poor prognosis.  [facility] hospice in to see patient.             Expected Discharge Plan:  (HOspice house) Barriers to Discharge: Hospice Bed not available   Patient Goals and CMS Choice        Expected Discharge Plan and Services Expected Discharge Plan:  (HOspice house)   Discharge Planning Services: CM Consult, Other - See comment (Hospice)                                          Prior Living Arrangements/Services     Patient language and need for interpreter reviewed:: No        Need for Family Participation in Patient Care: Yes (Comment) Care giver support system in place?: Yes (comment)   Criminal Activity/Legal Involvement Pertinent to Current Situation/Hospitalization: No - Comment as needed  Activities of Daily Living Home Assistive Devices/Equipment: Environmental consultant (specify type), Shower chair with back [employer]) ADL Screening (condition at time of admission) Patient's cognitive ability adequate to safely complete daily activities?: No Is the patient deaf or have difficulty hearing?: Yes Does the patient have difficulty seeing, even when wearing glasses/contacts?: Yes Does the patient have difficulty concentrating, remembering, or making decisions?: Yes Patient able to express need for assistance with ADLs?: Yes Does the patient have difficulty dressing or bathing?: Yes (Recent decline in taking care of herself.  Family at bedside surprised that patient has unstageable pressure ulcer.) Independently performs ADLs?: Yes (appropriate for developmental age) Does the patient have difficulty walking or climbing stairs?: Yes Weakness of Legs: Both Weakness of  Arms/Hands: Both  Permission Sought/Granted Permission sought to share information with : Case Manager, Customer service manager Permission granted to share information with : Yes, Verbal Permission Granted     Permission granted to share info w AGENCY: [facility] hosp[facility]        Emotional Assessment         Alcohol / Substance Use: Not Applicable Psych Involvement: No (comment)  Admission diagnosis:  Hypernatremia [E87.0] Weakness [R53.1] Generalized weakness [R53.1] AKI (acute kidney injury) ([place]) [N17.9] Patient Active Problem List   Diagnosis Date Noted   Generalized weakness 08/31/2021   Hypernatremia 08/31/2021   AKI (acute kidney injury) ([place]) 08/31/2021   Dysuria 08/31/2021   Primary adenocarcinoma of upper lobe of right lung ([place]) 06/27/2021   Brain mass 06/12/2021   Mass of upper lobe of right lung 06/12/2021   Hypokalemia 06/12/2021   Hyperlipidemia 03/08/2017   Hypertension 03/08/2017   AR (allergic rhinitis) 05/17/2016   Colon polyps 05/17/2016   Fibrocystic breast changes, bilateral 05/11/2014   PCP:  Marinda Elk, MD Pharmacy:   [facility] [address], [place] - [place] [address] [place] [address] Phone: [phone] Fax: [phone]     Social Determinants of Health (SDOH) Interventions    Readmission Risk Interventions No flowsheet data found.

## 2021-09-07 NOTE — TOC Progression Note (Signed)
Transition of Care [facility]) - Progression Note    Patient Details  Name: Sandra Jordan MRN: [id number] Date of Birth: 1951-09-03  Transition [facility] Care [facility]) CM/SW [place], RN Phone Number: 09/07/2021, 2:22 PM  Clinical Narrative:   [facility] hospice has accepted patient to hospice house, bed not yet available, [facility] to notify RNCM when there is a bed and with transfer details.  TOC to follow.    Expected Discharge Plan:  (HOspice house) Barriers to Discharge: Hospice Bed not available  Expected Discharge Plan and Services Expected Discharge Plan:  (HOspice house)   Discharge Planning Services: CM Consult, Other - See comment (Hospice)                                           Social Determinants of Health (SDOH) Interventions    Readmission Risk Interventions No flowsheet data found.

## 2021-09-07 NOTE — Progress Notes (Addendum)
Update 2000: Pt offered something to eat/drink but refused at this time. Will continue to monitor.  Pt has d 5 order but was not running when I took pt care. Pt HR sustaining at 126-128. Pt stable. NP Randol Kern made aware. Will continue to monitor.  Update 0146: NP Randol Kern discontinue for  5 % dextrose and ordered LR 250 ml/hr once.. Will continue to monitor.  Update 0147: HR is at 118-126 after LR 250 ml.hr once. No new order at this time.Will continue to monitor.  Update 0650: Lab called for a critical NA of 163 and chloride > 130. NP Randol Kern made aware. Will continue to monitor.  Update 0656: NP Randol Kern states will place order. Will continue to monitor.

## 2021-09-07 NOTE — Progress Notes (Signed)
[facility]) Hospital Liaison Note  Received request from [facility] for family interest in [place] with request for transfer. Chart reviewed and eligibility confirmed. Spoke with Jaynie Collins (patient's niece)  to confirm interest and explain services. Family agreeable to transfer whenever a bed is available. TOC aware.    Hospice Home is unable to offer a room today. Hospital Liaison will follow up tomorrow or sooner if a room becomes available. Please do not hesitate to call with questions.    Thank you for the opportunity to participate in this patient's care.  Buck Mam [facility] Liaison  [phone]

## 2021-09-07 NOTE — Plan of Care (Signed)
  Problem: Elimination: Goal: Will not experience complications related to bowel motility Outcome: Progressing   Problem: Elimination: Goal: Will not experience complications related to urinary retention Outcome: Progressing   Problem: Safety: Goal: Ability to remain free from injury will improve Outcome: Progressing

## 2021-09-08 DIAGNOSIS — Z66 Do not resuscitate: Secondary | ICD-10-CM

## 2021-09-08 LAB — BASIC METABOLIC PANEL
BUN: 7 mg/dL — ABNORMAL LOW (ref 8–23)
CO2: 22 mmol/L (ref 22–32)
Calcium: 8.6 mg/dL — ABNORMAL LOW (ref 8.9–10.3)
Chloride: >130 mmol/L (ref 98–111)
Creatinine, Ser: 0.85 mg/dL (ref 0.44–1.00)
GFR, Estimated: >60 mL/min (ref >60)
Glucose, Bld: 157 mg/dL — ABNORMAL HIGH (ref 70–99)
Potassium: 2.8 mmol/L — ABNORMAL LOW (ref 3.5–5.1)
Sodium: 160 mmol/L — ABNORMAL HIGH (ref 135–145)

## 2021-09-08 LAB — GLUCOSE, CAPILLARY
Glucose-Capillary: 119 mg/dL — ABNORMAL HIGH (ref 70–99)
Glucose-Capillary: 124 mg/dL — ABNORMAL HIGH (ref 70–99)
Glucose-Capillary: 49 mg/dL — ABNORMAL LOW (ref 70–99)

## 2021-09-08 MED ORDER — ONDANSETRON 4 MG PO TBDP: 4.0000 mg | ORAL_TABLET | Freq: Four times a day (QID) | ORAL | Status: DC | PRN

## 2021-09-08 MED ORDER — LORAZEPAM 2 MG/ML IJ SOLN
1.0000 mg | INTRAMUSCULAR | Status: DC | PRN
  Administered 2021-09-08: 1 mg via INTRAVENOUS
  Filled 2021-09-08: qty 1

## 2021-09-08 MED ORDER — MORPHINE BOLUS VIA INFUSION
1.0000 mg | INTRAVENOUS | Status: DC | PRN
  Administered 2021-09-08 (×6): 1 mg via INTRAVENOUS
  Filled 2021-09-08: qty 1

## 2021-09-08 MED ORDER — MORPHINE SULFATE (PF) 2 MG/ML IV SOLN: 1.0000 mg | INTRAVENOUS | Status: DC | PRN

## 2021-09-08 MED ORDER — ONDANSETRON HCL 4 MG/2ML IJ SOLN: 4.0000 mg | Freq: Four times a day (QID) | INTRAMUSCULAR | Status: DC | PRN

## 2021-09-08 MED ORDER — POTASSIUM CHLORIDE 10 MEQ/100ML IV SOLN: 10.0000 meq | INTRAVENOUS | Status: DC

## 2021-09-08 MED ORDER — GLYCOPYRROLATE 0.2 MG/ML IJ SOLN
0.2000 mg | INTRAMUSCULAR | Status: DC | PRN
  Administered 2021-09-08: 20:00:00 0.2 mg via INTRAVENOUS
  Filled 2021-09-08: qty 1

## 2021-09-08 MED ORDER — HALOPERIDOL 0.5 MG PO TABS: 0.5000 mg | ORAL_TABLET | ORAL | Status: DC | PRN

## 2021-09-08 MED ORDER — MORPHINE 100MG IN NS 100ML (1MG/ML) PREMIX INFUSION
1.0000 mg/h | INTRAVENOUS | Status: DC
  Administered 2021-09-08 (×5): 1 mg/h via INTRAVENOUS
  Filled 2021-09-08: qty 100

## 2021-09-08 MED ORDER — GLYCOPYRROLATE 0.2 MG/ML IJ SOLN: 0.2000 mg | INTRAMUSCULAR | Status: DC | PRN

## 2021-09-08 MED ORDER — GLYCOPYRROLATE 1 MG PO TABS: 1.0000 mg | ORAL_TABLET | ORAL | Status: DC | PRN

## 2021-09-08 MED ORDER — HALOPERIDOL LACTATE 5 MG/ML IJ SOLN: 0.5000 mg | INTRAMUSCULAR | Status: DC | PRN

## 2021-09-08 MED ORDER — HALOPERIDOL LACTATE 2 MG/ML PO CONC: 0.5000 mg | ORAL | Status: DC | PRN

## 2021-09-08 MED ORDER — POLYVINYL ALCOHOL 1.4 % OP SOLN
1.0000 [drp] | Freq: Four times a day (QID) | OPHTHALMIC | Status: DC | PRN
  Filled 2021-09-08: qty 15

## 2021-09-08 NOTE — Discharge Summary (Signed)
Physician Discharge Summary  Sandra [place] JGG:[id number] DOB: 05/20/1951 DOA: 08/30/2021  PCP: Marinda Elk, MD  Admit date: 08/30/2021 Discharge date: 09/08/2021  Admitted From: Home Disposition: Hospice home  Recommendations for Outpatient Follow-up:  Follow up with PCP in 1-2 weeks Please obtain BMP/CBC in one week Please follow up on the following pending results:  Discharge Condition: Guarded CODE STATUS: DNR Diet recommendation: Heart Healthy / Carb Modified / Regular / Dysphagia   Brief/Interim Summary: [place], 70 y.o. 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 Initially admitted with concern of septic shock, requiring pressors, secondary to UTI.  Urine cultures grew strep viridans, blood cultures remain negative.  Completed a course of antibiotics.  Remained very lethargic with poor p.o. intake.  Palliative care was also consulted and family changed the status from full code to DNR.  We also consulted oncology at family's request as they want to hear from her oncologist before making any decision regarding comfort measures only. Oncology saw her yesterday and talked with family today that she is no more a candidate for any continuation of therapy and approaching end-of-life now. Patient is becoming more unresponsive, lethargic, tachycardic.  Has not taken any p.o. intake for the past many days.  Hypernatremia despite giving D5.  After having long discussions with family they decided to proceed with comfort measures only and requested transfer to hospice home.  Patient is being  discharged to hospice home for end-of-life care. Very poor prognosis.  She was started on morphine infusion and end-of-life comfort measures started before discharge.  Discharge Diagnoses:  Principal Problem:   Generalized weakness Active Problems:   Hypertension   Primary adenocarcinoma of upper lobe of right lung (HCC)   Hypernatremia   AKI (acute kidney injury) [facility])   Dysuria    Discharge Instructions  Discharge Instructions     Diet - low sodium heart healthy   Complete by: As directed    Discharge wound care:   Complete by: As directed    Per hospice home protocol   Increase activity slowly   Complete by: As directed       Allergies as of 09/08/2021   No Known Allergies      Medication List     STOP taking these medications    cetirizine 10 MG tablet Commonly known as: ZYRTEC   cyanocobalamin 1000 MCG tablet   dexamethasone 4 MG tablet Commonly known as: DECADRON   fluticasone 50 MCG/ACT nasal spray Commonly known as: FLONASE   hydrALAZINE 25 MG tablet Commonly known as: APRESOLINE   hydrochlorothiazide 25 MG tablet Commonly known as: HYDRODIURIL   ibuprofen 800 MG tablet Commonly known as: ADVIL   lidocaine-prilocaine cream Commonly known as: EMLA   lovastatin 40 MG tablet Commonly known as: MEVACOR   meloxicam 7.5 MG tablet Commonly known as: MOBIC   mirtazapine 15 MG tablet Commonly known as: REMERON   ondansetron 8 MG disintegrating tablet Commonly known as: ZOFRAN-ODT   potassium chloride 10 MEQ tablet Commonly known as: KLOR-CON   potassium chloride 20 MEQ/15ML (10%) Soln   prochlorperazine 10 MG tablet Commonly known as: COMPAZINE  Discharge Care Instructions  (From admission, onward)           Start     Ordered   09/08/21 0000  Discharge wound care:       Comments: Per hospice home protocol   09/08/21 1527            No Known Allergies  Consultations: PCCM Oncology Palliative  care  Procedures/Studies: CT Head Wo Contrast  Result Date: 08/30/2021 CLINICAL DATA:  Altered mental status EXAM: CT HEAD WITHOUT CONTRAST TECHNIQUE: Contiguous axial images were obtained from the base of the skull through the vertex without intravenous contrast. COMPARISON:  CT head 06/13/2021 BRAIN: BRAIN Cerebral ventricle sizes are concordant with the degree of cerebral volume loss. Patchy and confluent areas of decreased attenuation are noted throughout the deep and periventricular white matter of the cerebral hemispheres bilaterally, compatible with chronic microvascular ischemic disease. No evidence of large-territorial acute infarction. No parenchymal hemorrhage. No mass lesion. No extra-axial collection. No mass effect or midline shift. No hydrocephalus. Basilar cisterns are patent. Vascular: No hyperdense vessel. Skull: No acute fracture or focal lesion. Sinuses/Orbits: Paranasal sinuses and mastoid air cells are clear. The orbits are unremarkable. Other: None. IMPRESSION: No acute intracranial abnormality. Electronically Signed   By: Iven Finn M.D.   On: 08/30/2021 23:03   CT ABDOMEN PELVIS W CONTRAST  Result Date: 09/04/2021 CLINICAL DATA:  70 year old female with history of abdominal pain. Fever. EXAM: CT ABDOMEN AND PELVIS WITH CONTRAST TECHNIQUE: Multidetector CT imaging of the abdomen and pelvis was performed using the standard protocol following bolus administration of intravenous contrast. CONTRAST:  31mL OMNIPAQUE IOHEXOL 300 MG/ML  SOLN COMPARISON:  CT the chest, abdomen and pelvis 06/12/2021. FINDINGS: Lower chest: Trace bilateral pleural effusions lying dependently with some minimal passive subsegmental atelectasis in the lower lobes of the lungs bilaterally. Hepatobiliary: No suspicious cystic or solid hepatic lesions are confidently identified on today's noncontrast CT examination. Status post cholecystectomy. Pancreas: No pancreatic mass. No pancreatic ductal dilatation. No  pancreatic or peripancreatic fluid collections or inflammatory changes. Spleen: Unremarkable. Adrenals/Urinary Tract: In the lower pole collecting system of the left kidney there is a 7 mm nonobstructive calculus. In the upper pole of the right kidney there is a subcentimeter low-attenuation lesion, too small to characterize, but statistically likely to represent a cyst. Bilateral adrenal glands are normal in appearance. No hydroureteronephrosis. Urinary bladder is completely decompressed around an indwelling Foley balloon catheter. Stomach/Bowel: The appearance of the stomach is normal. No pathologic dilatation of small bowel or colon. Rectal bag in position. Normal appendix. Vascular/Lymphatic: Aortic atherosclerosis, without evidence of aneurysm or dissection in the abdominal or pelvic vasculature. No lymphadenopathy noted in the abdomen or pelvis. Reproductive: Status post hysterectomy. Ovaries are not confidently identified may be surgically absent or atrophic. Other: No significant volume of ascites.  No pneumoperitoneum. Musculoskeletal: There are no aggressive appearing lytic or blastic lesions noted in the visualized portions of the skeleton. IMPRESSION: 1. No acute findings are noted in the abdomen or pelvis to account for the patient's symptoms. 2. Trace bilateral pleural effusions. 3. 7 mm nonobstructive calculus in the lower pole collecting system of left kidney. No ureteral stones or findings of urinary tract obstruction are noted at this time. 4. Aortic atherosclerosis. 5. Additional incidental findings, as above. Electronically Signed   By: Vinnie Langton M.D.   On: 09/04/2021 12:07   DG Chest Portable 1 View  Result Date: 08/31/2021 CLINICAL DATA:  Weakness and possible sepsis EXAM: PORTABLE CHEST  1 VIEW COMPARISON:  06/12/2021 FINDINGS: Cardiac shadow is within normal limits. Right chest wall port is now seen in satisfactory position. Previously seen consolidation in the right upper lobe has  nearly completely resolved although some minimal residual opacity remains. No bony abnormality is noted. IMPRESSION: Significant improved aeration in the right upper lobe although some persistent infiltrate remains. Electronically Signed   By: Inez Catalina M.D.   On: 08/31/2021 00:53    Subjective: Patient was seen and examined today.  She was pretty much unresponsive, she does not open eyes or follow any commands.  No response to light touch either.  Some spontaneous extremity movements noted.  Appears comfortable.  Discharge Exam: Vitals:   09/08/21 0633 09/08/21 0742  BP: 101/67 117/61  Pulse: 67 (!) 160  Resp: (!) 22 20  Temp: 98.9 F (37.2 C) 99.3 F (37.4 C)  SpO2: 98% (!) 59%   Vitals:   09/08/21 0206 09/08/21 0418 09/08/21 0633 09/08/21 0742  BP: 103/75  101/67 117/61  Pulse: (!) 110  67 (!) 160  Resp: 20  (!) 22 20  Temp: 98.3 F (36.8 C)  98.9 F (37.2 C) 99.3 F (37.4 C)  TempSrc:    Oral  SpO2: 98%  98% (!) 59%  Weight:  52.2 kg    Height:        General: Pt does not open eyes or does not respond to light touch, not in acute distress.  Some spontaneous extremities movement. Cardiovascular: RRR, S1/S2 +, Respiratory: CTA bilaterally, no wheezing, no rhonchi Abdominal: Soft, NT, ND, bowel sounds + Extremities: no edema, no cyanosis   The results of significant diagnostics from this hospitalization (including imaging, microbiology, ancillary and laboratory) are listed below for reference.    Microbiology: Recent Results (from the past 240 hour(s))  Resp Panel by RT-PCR (Flu A&B, Covid) Nasopharyngeal Swab     Status: None   Collection Time: 08/30/21 10:40 PM   Specimen: Nasopharyngeal Swab; Nasopharyngeal(NP) swabs in vial transport medium  Result Value Ref Range Status   SARS Coronavirus 2 by RT PCR NEGATIVE NEGATIVE Final    Comment: (NOTE) SARS-CoV-2 target nucleic acids are NOT DETECTED.  The SARS-CoV-2 RNA is generally detectable in upper  respiratory specimens during the acute phase of infection. The lowest concentration of SARS-CoV-2 viral copies this assay can detect is 138 copies/mL. A negative result does not preclude SARS-Cov-2 infection and should not be used as the sole basis for treatment or other patient management decisions. A negative result may occur with  improper specimen collection/handling, submission of specimen other than nasopharyngeal swab, presence of viral mutation(s) within the areas targeted by this assay, and inadequate number of viral copies(<138 copies/mL). A negative result must be combined with clinical observations, patient history, and epidemiological information. The expected result is Negative.  Fact Sheet for Patients:  EntrepreneurPulse.com.au  Fact Sheet for Healthcare Providers:  IncredibleEmployment.be  This test is no t yet approved or cleared by the [place] FDA and  has been authorized for detection and/or diagnosis of SARS-CoV-2 by FDA under an Emergency Use Authorization (EUA). This EUA will remain  in effect (meaning this test can be used) for the duration of the COVID-19 declaration under Section 564(b)(1) of the Act, 21 U.S.C.section 360bbb-3(b)(1), unless the authorization is terminated  or revoked sooner.       Influenza A by PCR NEGATIVE NEGATIVE Final   Influenza B by PCR NEGATIVE NEGATIVE Final    Comment: (NOTE) The Xpert Xpress SARS-CoV-2/FLU/RSV  plus assay is intended as an aid in the diagnosis of influenza from Nasopharyngeal swab specimens and should not be used as a sole basis for treatment. Nasal washings and aspirates are unacceptable for Xpert Xpress SARS-CoV-2/FLU/RSV testing.  Fact Sheet for Patients: EntrepreneurPulse.com.au  Fact Sheet for Healthcare Providers: IncredibleEmployment.be  This test is not yet approved or cleared by the [place] FDA and has been  authorized for detection and/or diagnosis of SARS-CoV-2 by FDA under an Emergency Use Authorization (EUA). This EUA will remain in effect (meaning this test can be used) for the duration of the COVID-19 declaration under Section 564(b)(1) of the Act, 21 U.S.C. section 360bbb-3(b)(1), unless the authorization is terminated or revoked.  Performed at [facility], [address] [place], [place] [address]   Blood culture (routine x 2)     Status: None   Collection Time: 08/30/21 10:40 PM   Specimen: BLOOD  Result Value Ref Range Status   Specimen Description BLOOD RCHEST PORK  Final   Special Requests   Final    BOTTLES DRAWN AEROBIC AND ANAEROBIC Blood Culture adequate volume   Culture   Final    NO GROWTH 5 DAYS Performed at [facility], [address] [place], [place] [address]    Report Status 09/04/2021 FINAL  Final  Urine Culture     Status: Abnormal   Collection Time: 08/31/21 12:32 AM   Specimen: Urine, Catheterized  Result Value Ref Range Status   Specimen Description   Final    URINE, CATHETERIZED Performed at [facility], [address] [place], [place] [address]    Special Requests   Final    NONE Performed at [facility], [address], [place], [place] [address]    Culture (A)  Final    >=100,000 COLONIES/mL VIRIDANS STREPTOCOCCUS Standardized susceptibility testing for this organism is not available. Performed at [facility], [place] [address], [place], [place] [address]    Report Status 09/01/2021 FINAL  Final  Culture, blood (Routine X 2) w Reflex to ID Panel     Status: None   Collection Time: 09/01/21  8:22 AM   Specimen: BLOOD  Result Value Ref Range Status   Specimen Description BLOOD BLOOD LEFT HAND  Final   Special Requests   Final    BOTTLES DRAWN AEROBIC AND ANAEROBIC Blood Culture adequate volume   Culture   Final    NO GROWTH 5 DAYS Performed at [facility] [address]  [place], [place] [address]    Report Status 09/06/2021 FINAL  Final  C Difficile Quick Screen w PCR reflex     Status: None   Collection Time: 09/01/21  2:09 PM   Specimen: STOOL  Result Value Ref Range Status   C Diff antigen NEGATIVE NEGATIVE Final   C Diff toxin NEGATIVE NEGATIVE Final   C Diff interpretation No C. difficile detected.  Final    Comment: Performed at [facility], [address] [place], [place] [address]  Gastrointestinal Panel by PCR , Stool     Status: None   Collection Time: 09/01/21  2:09 PM   Specimen: Stool  Result Value Ref Range Status   Campylobacter species NOT DETECTED NOT DETECTED Final   Plesimonas shigelloides NOT DETECTED NOT DETECTED Final   Salmonella species NOT DETECTED NOT DETECTED Final   Yersinia enterocolitica NOT DETECTED NOT DETECTED Final   Vibrio species NOT DETECTED NOT DETECTED Final   Vibrio cholerae NOT  DETECTED NOT DETECTED Final   Enteroaggregative E coli (EAEC) NOT DETECTED NOT DETECTED Final   Enteropathogenic E coli (EPEC) NOT DETECTED NOT DETECTED Final   Enterotoxigenic E coli (ETEC) NOT DETECTED NOT DETECTED Final   Shiga like toxin producing E coli (STEC) NOT DETECTED NOT DETECTED Final   Shigella/Enteroinvasive E coli (EIEC) NOT DETECTED NOT DETECTED Final   Cryptosporidium NOT DETECTED NOT DETECTED Final   Cyclospora cayetanensis NOT DETECTED NOT DETECTED Final   Entamoeba histolytica NOT DETECTED NOT DETECTED Final   Giardia lamblia NOT DETECTED NOT DETECTED Final   Adenovirus F40/41 NOT DETECTED NOT DETECTED Final   Astrovirus NOT DETECTED NOT DETECTED Final   Norovirus GI/GII NOT DETECTED NOT DETECTED Final   Rotavirus A NOT DETECTED NOT DETECTED Final   Sapovirus (I, II, IV, and V) NOT DETECTED NOT DETECTED Final    Comment: Performed at [facility], [address] [place], [place] [address]  MRSA Next Gen by PCR, Nasal     Status: None   Collection Time: 09/01/21  3:03 PM   Specimen: Nasal  Mucosa; Nasal Swab  Result Value Ref Range Status   MRSA by PCR Next Gen NOT DETECTED NOT DETECTED Final    Comment: (NOTE) The GeneXpert MRSA Assay (FDA approved for NASAL specimens only), is one component of a comprehensive MRSA colonization surveillance program. It is not intended to diagnose MRSA infection nor to guide or monitor treatment for MRSA infections. Test performance is not FDA approved in patients less than 66 years old. Performed at [facility], [address] [place], [place] [address]      Labs: BNP (last 3 results) No results for input(s): BNP in the last 8760 hours. Basic Metabolic Panel: Recent Labs  Lab 09/02/21 0331 09/02/21 1930 09/03/21 0337 09/03/21 2214 09/04/21 0301 09/05/21 0624 09/06/21 1232 09/07/21 0456 09/07/21 0956 09/07/21 1334 09/08/21 0503  NA 159*  --  160*   < > 157*   < > 145 163* 166* 166* 160*  K 3.2*   < > 3.3*  --  3.5   < > 2.2* 3.5 3.2* 3.2* 2.8*  CL 128*  --  >130*  --  129*   < > 122* >130* >130* >130* >130*  CO2 24  --  25  --  21*   < > 20* 19* 21* 19* 22  GLUCOSE 99  --  132*  --  127*   < > 320* 96 122* 158* 157*  BUN 6*  --  6*  --  8   < > 7* 9 9 9  7*  CREATININE 0.90  --  0.96  --  0.93   < > 0.76 0.81 0.87 0.86 0.85  CALCIUM 8.2*  --  9.1  --  8.7*   < > 7.7* 9.0 9.5 9.2 8.6*  MG 1.6*  --  2.8*  --  2.4  --  2.0  --   --   --   --   PHOS 2.8  --  2.2*  --  1.7*  --   --   --   --   --   --    < > = values in this interval not displayed.   Liver Function Tests: No results for input(s): AST, ALT, ALKPHOS, BILITOT, PROT, ALBUMIN in the last 168 hours. No results for input(s): LIPASE, AMYLASE in the last 168 hours. No results for input(s): AMMONIA in the last 168 hours. CBC: Recent Labs  Lab 09/02/21  0109 09/03/21 0337 09/04/21 0301 09/05/21 0624 09/06/21 1232  WBC 19.7* 16.5* 13.9* 13.0* 16.5*  HGB 7.8* 8.7* 9.6* 10.5* 9.4*  HCT 25.0* 28.2* 30.9* 33.1* 29.4*  MCV 93.6 96.2 95.7 94.6 93.3  PLT  146* 140* 146* 129* 150   Cardiac Enzymes: No results for input(s): CKTOTAL, CKMB, CKMBINDEX, TROPONINI in the last 168 hours. BNP: Invalid input(s): POCBNP CBG: Recent Labs  Lab 09/07/21 2022 09/07/21 2352 09/08/21 0407 09/08/21 0745 09/08/21 1157  GLUCAP 106* 134* 119* 124* 49*   D-Dimer No results for input(s): DDIMER in the last 72 hours. Hgb A1c No results for input(s): HGBA1C in the last 72 hours. Lipid Profile No results for input(s): CHOL, HDL, LDLCALC, TRIG, CHOLHDL, LDLDIRECT in the last 72 hours. Thyroid function studies No results for input(s): TSH, T4TOTAL, T3FREE, THYROIDAB in the last 72 hours.  Invalid input(s): FREET3 Anemia work up No results for input(s): VITAMINB12, FOLATE, FERRITIN, TIBC, IRON, RETICCTPCT in the last 72 hours. Urinalysis    Component Value Date/Time   COLORURINE YELLOW (A) 08/30/2021 0032   APPEARANCEUR CLOUDY (A) 08/30/2021 0032   APPEARANCEUR Cloudy (A) 03/08/2017 1419   LABSPEC 1.010 08/30/2021 0032   PHURINE 5.0 08/30/2021 0032   GLUCOSEU NEGATIVE 08/30/2021 0032   HGBUR NEGATIVE 08/30/2021 0032   BILIRUBINUR NEGATIVE 08/30/2021 0032   BILIRUBINUR Negative 03/08/2017 1419   KETONESUR 5 (A) 08/30/2021 0032   PROTEINUR NEGATIVE 08/30/2021 0032   NITRITE NEGATIVE 08/30/2021 0032   LEUKOCYTESUR MODERATE (A) 08/30/2021 0032   Sepsis Labs Invalid input(s): PROCALCITONIN,  WBC,  LACTICIDVEN Microbiology Recent Results (from the past 240 hour(s))  Resp Panel by RT-PCR (Flu A&B, Covid) Nasopharyngeal Swab     Status: None   Collection Time: 08/30/21 10:40 PM   Specimen: Nasopharyngeal Swab; Nasopharyngeal(NP) swabs in vial transport medium  Result Value Ref Range Status   SARS Coronavirus 2 by RT PCR NEGATIVE NEGATIVE Final    Comment: (NOTE) SARS-CoV-2 target nucleic acids are NOT DETECTED.  The SARS-CoV-2 RNA is generally detectable in upper respiratory specimens during the acute phase of infection. The lowest concentration  of SARS-CoV-2 viral copies this assay can detect is 138 copies/mL. A negative result does not preclude SARS-Cov-2 infection and should not be used as the sole basis for treatment or other patient management decisions. A negative result may occur with  improper specimen collection/handling, submission of specimen other than nasopharyngeal swab, presence of viral mutation(s) within the areas targeted by this assay, and inadequate number of viral copies(<138 copies/mL). A negative result must be combined with clinical observations, patient history, and epidemiological information. The expected result is Negative.  Fact Sheet for Patients:  EntrepreneurPulse.com.au  Fact Sheet for Healthcare Providers:  IncredibleEmployment.be  This test is no t yet approved or cleared by the [place] FDA and  has been authorized for detection and/or diagnosis of SARS-CoV-2 by FDA under an Emergency Use Authorization (EUA). This EUA will remain  in effect (meaning this test can be used) for the duration of the COVID-19 declaration under Section 564(b)(1) of the Act, 21 U.S.C.section 360bbb-3(b)(1), unless the authorization is terminated  or revoked sooner.       Influenza A by PCR NEGATIVE NEGATIVE Final   Influenza B by PCR NEGATIVE NEGATIVE Final    Comment: (NOTE) The Xpert Xpress SARS-CoV-2/FLU/RSV plus assay is intended as an aid in the diagnosis of influenza from Nasopharyngeal swab specimens and should not be used as a sole basis for treatment. Nasal washings and aspirates are  unacceptable for Xpert Xpress SARS-CoV-2/FLU/RSV testing.  Fact Sheet for Patients: EntrepreneurPulse.com.au  Fact Sheet for Healthcare Providers: IncredibleEmployment.be  This test is not yet approved or cleared by the [place] FDA and has been authorized for detection and/or diagnosis of SARS-CoV-2 by FDA under an Emergency Use  Authorization (EUA). This EUA will remain in effect (meaning this test can be used) for the duration of the COVID-19 declaration under Section 564(b)(1) of the Act, 21 U.S.C. section 360bbb-3(b)(1), unless the authorization is terminated or revoked.  Performed at [facility], [address] [place], [place] [address]   Blood culture (routine x 2)     Status: None   Collection Time: 08/30/21 10:40 PM   Specimen: BLOOD  Result Value Ref Range Status   Specimen Description BLOOD RCHEST PORK  Final   Special Requests   Final    BOTTLES DRAWN AEROBIC AND ANAEROBIC Blood Culture adequate volume   Culture   Final    NO GROWTH 5 DAYS Performed at [facility], [address], [place], [place] [address]    Report Status 09/04/2021 FINAL  Final  Urine Culture     Status: Abnormal   Collection Time: 08/31/21 12:32 AM   Specimen: Urine, Catheterized  Result Value Ref Range Status   Specimen Description   Final    URINE, CATHETERIZED Performed at [facility], [address], [place], [place] [address]    Special Requests   Final    NONE Performed at [facility], [address], [place], [place] [address]    Culture (A)  Final    >=100,000 COLONIES/mL VIRIDANS STREPTOCOCCUS Standardized susceptibility testing for this organism is not available. Performed at [facility], [place] [address], [place], [place] [address]    Report Status 09/01/2021 FINAL  Final  Culture, blood (Routine X 2) w Reflex to ID Panel     Status: None   Collection Time: 09/01/21  8:22 AM   Specimen: BLOOD  Result Value Ref Range Status   Specimen Description BLOOD BLOOD LEFT HAND  Final   Special Requests   Final    BOTTLES DRAWN AEROBIC AND ANAEROBIC Blood Culture adequate volume   Culture   Final    NO GROWTH 5 DAYS Performed at [facility], [address] [place], [place] [address]    Report Status 09/06/2021 FINAL  Final  C Difficile Quick Screen  w PCR reflex     Status: None   Collection Time: 09/01/21  2:09 PM   Specimen: STOOL  Result Value Ref Range Status   C Diff antigen NEGATIVE NEGATIVE Final   C Diff toxin NEGATIVE NEGATIVE Final   C Diff interpretation No C. difficile detected.  Final    Comment: Performed at [facility], [address] [place], [place] [address]  Gastrointestinal Panel by PCR , Stool     Status: None   Collection Time: 09/01/21  2:09 PM   Specimen: Stool  Result Value Ref Range Status   Campylobacter species NOT DETECTED NOT DETECTED Final   Plesimonas shigelloides NOT DETECTED NOT DETECTED Final   Salmonella species NOT DETECTED NOT DETECTED Final   Yersinia enterocolitica NOT DETECTED NOT DETECTED Final   Vibrio species NOT DETECTED NOT DETECTED Final   Vibrio cholerae NOT DETECTED NOT DETECTED Final   Enteroaggregative E coli (EAEC) NOT DETECTED NOT DETECTED Final   Enteropathogenic E coli (EPEC) NOT DETECTED NOT DETECTED Final   Enterotoxigenic E coli (ETEC)  NOT DETECTED NOT DETECTED Final   Shiga like toxin producing E coli (STEC) NOT DETECTED NOT DETECTED Final   Shigella/Enteroinvasive E coli (EIEC) NOT DETECTED NOT DETECTED Final   Cryptosporidium NOT DETECTED NOT DETECTED Final   Cyclospora cayetanensis NOT DETECTED NOT DETECTED Final   Entamoeba histolytica NOT DETECTED NOT DETECTED Final   Giardia lamblia NOT DETECTED NOT DETECTED Final   Adenovirus F40/41 NOT DETECTED NOT DETECTED Final   Astrovirus NOT DETECTED NOT DETECTED Final   Norovirus GI/GII NOT DETECTED NOT DETECTED Final   Rotavirus A NOT DETECTED NOT DETECTED Final   Sapovirus (I, II, IV, and V) NOT DETECTED NOT DETECTED Final    Comment: Performed at [facility], [address] [place], [place] [address]  MRSA Next Gen by PCR, Nasal     Status: None   Collection Time: 09/01/21  3:03 PM   Specimen: Nasal Mucosa; Nasal Swab  Result Value Ref Range Status   MRSA by PCR Next Gen NOT DETECTED NOT  DETECTED Final    Comment: (NOTE) The GeneXpert MRSA Assay (FDA approved for NASAL specimens only), is one component of a comprehensive MRSA colonization surveillance program. It is not intended to diagnose MRSA infection nor to guide or monitor treatment for MRSA infections. Test performance is not FDA approved in patients less than 68 years old. Performed at [facility], [address] [place], [place] [address]     Time coordinating discharge: Over 30 minutes  SIGNED:  Lorella Nimrod, MD  [facility] 09/08/2021, 3:28 PM  If 7PM-7AM, please contact night-coverage [email or web address]  This record has been created using Systems analyst. Errors have been sought and corrected,but may not always be located. Such creation errors do not reflect on the standard of care.

## 2021-09-08 NOTE — Care Management Important Message (Signed)
Important Message  Patient Details  Name: Sandra Jordan MRN: [id number] Date of Birth: [birth year]   Medicare Important Message Given:  Other (see comment)  Patient is to discharge to the [facility] when bed is available.  Out of respect for the patient and family no Important Message from [facility] given.   [name] 09/08/2021, 8:21 AM

## 2021-09-08 NOTE — Progress Notes (Signed)
   09/07/21 2350  Assess: MEWS Score  Temp 98.4 F (36.9 C)  BP 104/71  Pulse Rate (!) 106  Resp 19  SpO2 99 %  O2 Device Room Air  Assess: MEWS Score  MEWS Temp 0  MEWS Systolic 0  MEWS Pulse 1  MEWS RR 0  MEWS LOC 0  MEWS Score 1  MEWS Score Color Green  Assess: if the MEWS score is Yellow or Red  Were vital signs taken at a resting state? Yes  Focused Assessment No change from prior assessment  Does the patient meet 2 or more of the SIRS criteria? No  Does the patient have a confirmed or suspected source of infection? No  Provider and Rapid Response Notified? No  MEWS guidelines implemented *See Row Information* No, previously yellow, continue vital signs every 4 hours  Assess: SIRS CRITERIA  SIRS Temperature  0  SIRS Pulse 1  SIRS Respirations  0  SIRS WBC 0  SIRS Score Sum  1  Mews back to green. Continue to monitor per MEWs protocol

## 2021-09-08 NOTE — Progress Notes (Signed)
[facility]  Bed offered and accepted at [facility]. Family aware and agreeable to transfer today. TOC aware. Unit RN please call report to [phone] prior to patient leaving the unit. Please call with any questions or concerns. Thank you.   Transport set for [employer].   [facility] Liaison  [phone]

## 2021-09-08 NOTE — TOC Progression Note (Signed)
Transition of Care [facility]) - Progression Note    Patient Details  Name: Sandra Jordan MRN: [id number] Date of Birth: 26-May-1951  Transition of Care [facility]) CM/SW [place], RN Phone Number: 09/08/2021, 11:38 AM  Clinical Narrative:   Patient discharging this evening to [facility].  [facility] is arranging transport.      Expected Discharge Plan:  ([facility]) Barriers to Discharge: Hospice Bed not available  Expected Discharge Plan and Services Expected Discharge Plan:  ([facility])   Discharge Planning Services: CM Consult, Other - See comment (Hospice)                                           Social Determinants of Health (SDOH) Interventions    Readmission Risk Interventions No flowsheet data found.

## 2021-09-08 NOTE — Progress Notes (Signed)
                                                     Palliative Care Progress Note, Assessment & Plan   Patient Name: Sandra Jordan       Date: 09/08/2021 DOB: 28-Jul-1951  Age: 70 y.o. MRN#: [id number] Attending Physician: Lorella Nimrod, MD Primary Care Physician: Marinda Elk, MD Admit Date: 08/30/2021  Reason for Consultation/Follow-up: Establishing goals of care  Subjective: Patient is lying flat in bed.  Mittens are in place.  IV fluids running.  Nasal cannula at 3 L in place.  No grimacing or moaning noted.  Patient is nonverbal.  She does not respond to verbal stimuli or light touch.  She does not open her eyes.  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  Patient was admitted with increased weakness and failure to thrive.  To date family has decided to move forward with full comfort care.  Patient has been evaluated for hospice inpatient unit no bed available today.  Palliative medicine was asked to get reinvolved to ensure full comfort measures are put in place for patient today.  Summary of counseling/coordination of care: After reviewing the patient's chart, epic notes, and MAR, I assessed the patient at bedside.  No family present.  Patient has no signs of dyspnea, agitation, anxiety, pain, or increased work of breathing.  Patient has been receiving morphine 2 mg as needed rather consistently since late yesterday.  Patient will be transition to a morphine drip.  Ativan is already in [facility] and as per nursing has not been needed today.  Discussed moving towards full comfort care with nurse.  I will make necessary changes in the chart to reflect measures only focused on comfort.  Code  Status: DNR  Prognosis: Hours - Days  Discharge Planning: Hospice facility  Recommendations/Plan: Full comfort care measures Remove nasal cannula DNR Morphine GTT Ativan as needed  Care plan was discussed with patient , nursing. Dr. Grayland Ormond, Dr. Reesa Chew  Physical Exam Constitutional:      General: She is not in acute distress.    Appearance: She is ill-appearing and toxic-appearing.  HENT:     Head: Normocephalic and atraumatic.     Mouth/Throat:     Mouth: Mucous membranes are dry.  Cardiovascular:     Rate and Rhythm: Normal rate.     Pulses: Normal pulses.  Pulmonary:     Effort: Pulmonary effort is normal.  Abdominal:     Palpations: Abdomen is soft.  Musculoskeletal:     Comments: Generalized weakness  Skin:    General: Skin is warm and dry.  Neurological:     Comments: nonverbal               Total Time 25 minutes  Greater than 50%  of this time was spent counseling and coordinating care related to the above assessment and plan.  Thank you for allowing the Palliative Medicine Team to assist in the care of this patient.  [place] Ilsa Iha, FNP-BC Palliative Medicine Team Team Phone # [phone]

## 2021-09-08 NOTE — Progress Notes (Addendum)
Nutrition Brief Note  Chart reviewed. Pt now transitioning to comfort care. Pt is refusing all PO's; plan to discharge to hospice home once bed is available.  No further nutrition interventions planned at this time.  Please re-consult as needed.   Loistine Chance, RD, LDN, [place] Registered Dietitian II Certified Diabetes Care and Education Specialist Please refer to [facility] for RD and/or RD on-call/weekend/after hours pager

## 2021-09-13 ENCOUNTER — Inpatient Hospital Stay: Payer: Medicare Other | Admitting: Oncology

## 2021-09-13 ENCOUNTER — Inpatient Hospital Stay: Payer: Medicare Other

## 2021-09-13 DIAGNOSIS — C3411 Malignant neoplasm of upper lobe, right bronchus or lung: Secondary | ICD-10-CM

## 2021-09-15 ENCOUNTER — Ambulatory Visit: Payer: Medicare Other

## 2021-10-08 DEATH — deceased

## 2022-01-16 ENCOUNTER — Encounter: Payer: Self-pay | Admitting: Oncology

## 2022-05-02 IMAGING — US US ABDOMEN LIMITED
1 series · 14 of 25 positions shown · non-contrast
Comparison: CT 06/12/2021

CLINICAL DATA: Elevated liver enzymes

EXAM:
ULTRASOUND ABDOMEN LIMITED RIGHT UPPER QUADRANT

[Series 1: us abdomen limited ruq (liver/gb) · 14 of 27 slices shown]
[im 1/27]
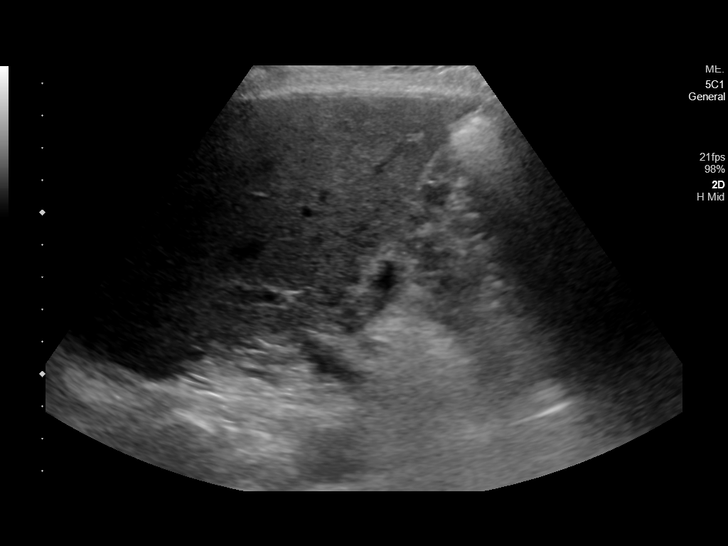
[im 3/27]
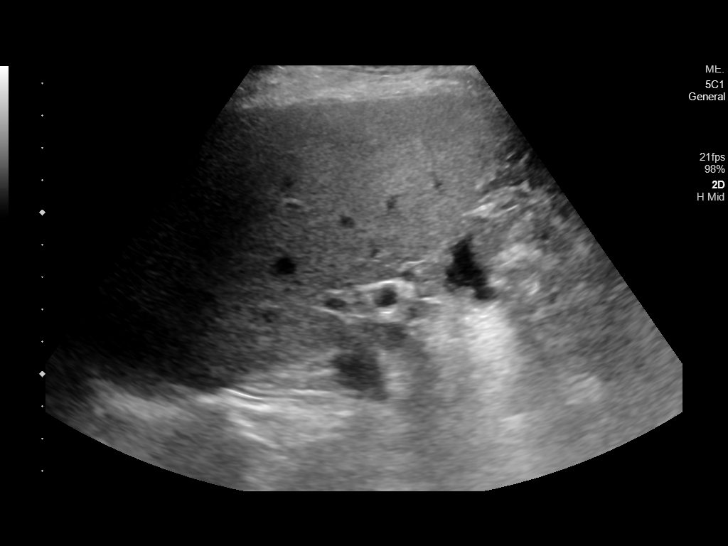
[im 5/27]
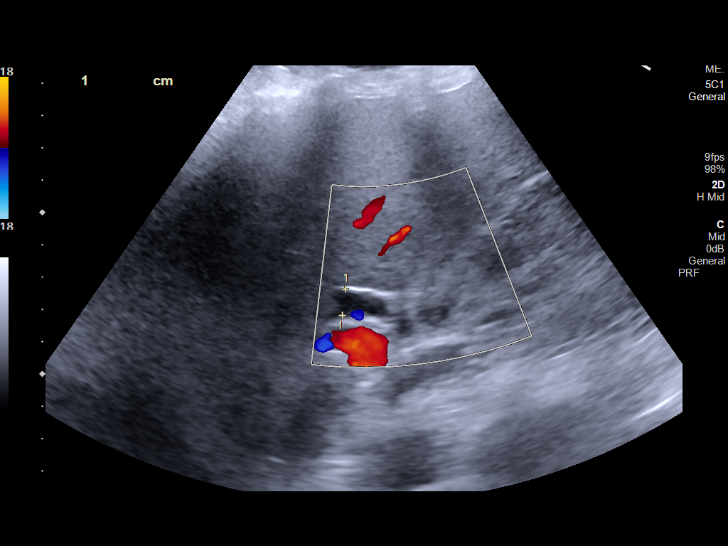
[im 7/27]
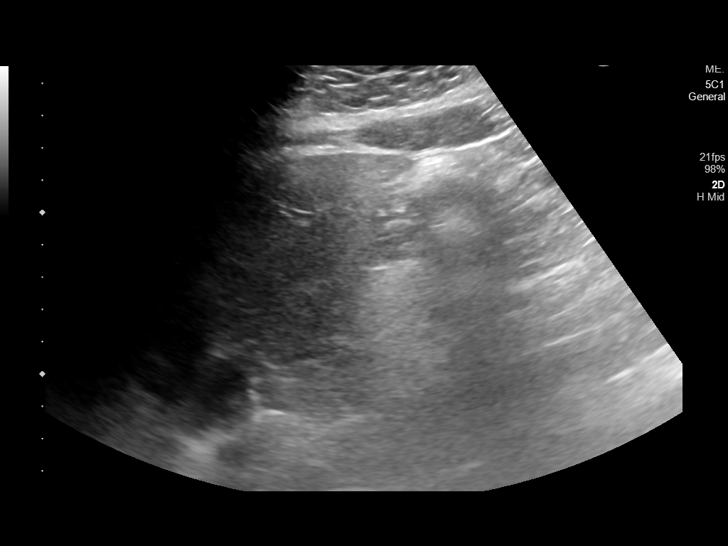
[im 9/27]
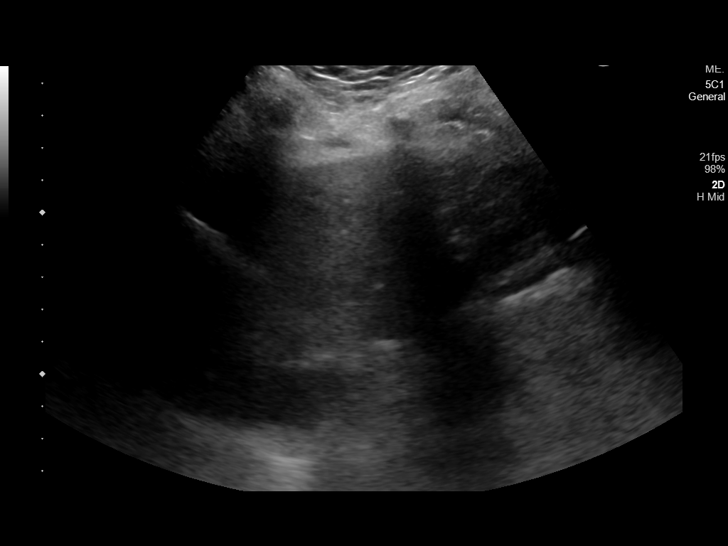
[im 10/27]
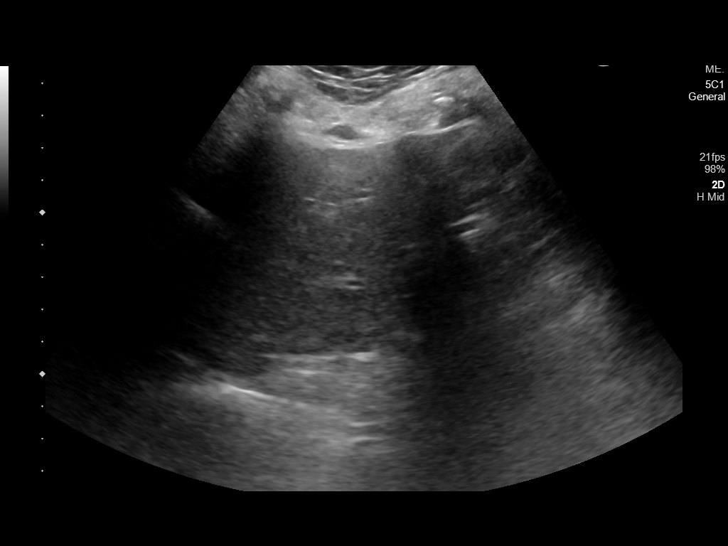
[im 12/27]
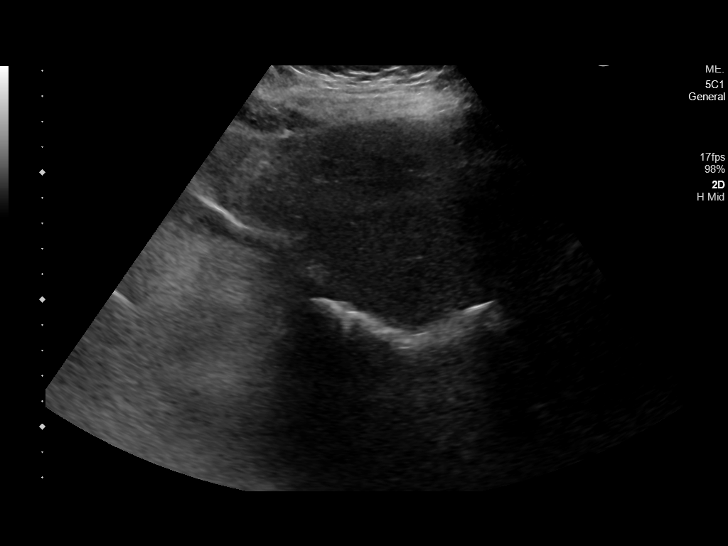
[im 15/27]
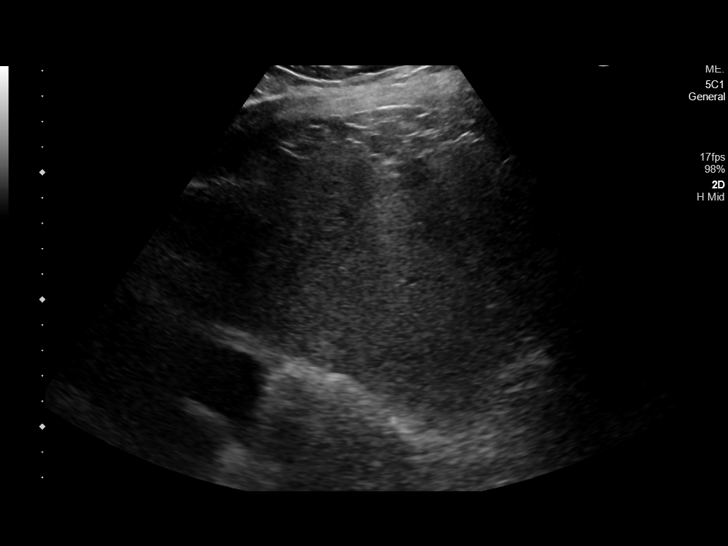
[im 17/27]
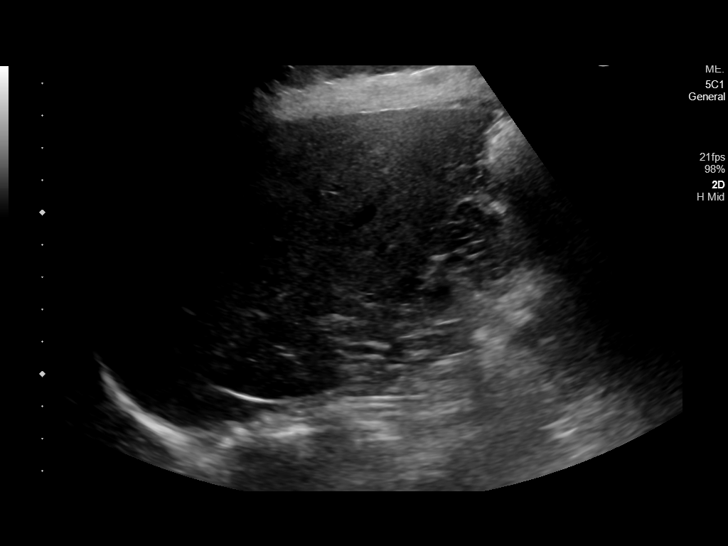
[im 18/27]
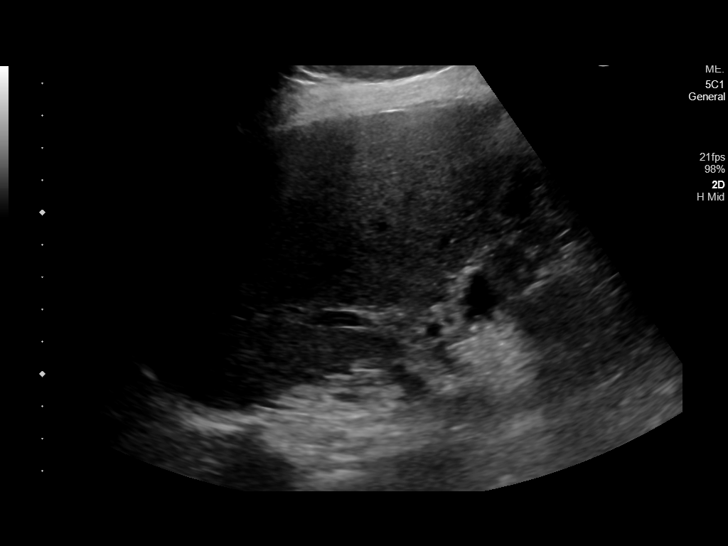
[im 20/27]
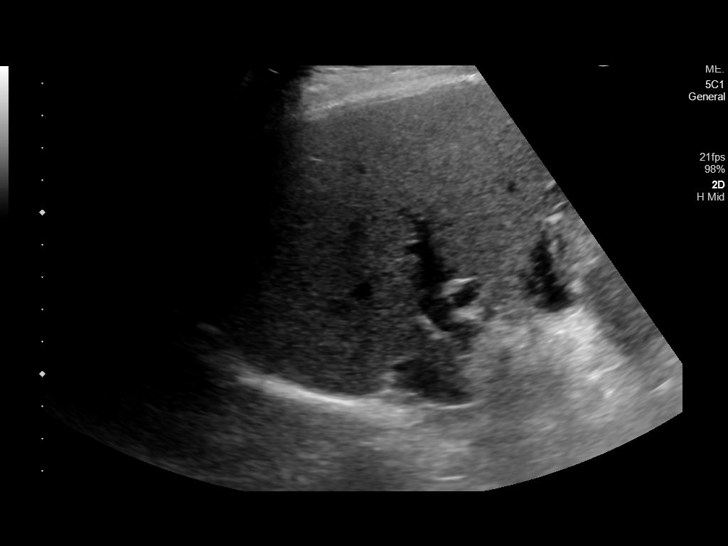
[im 22/27]
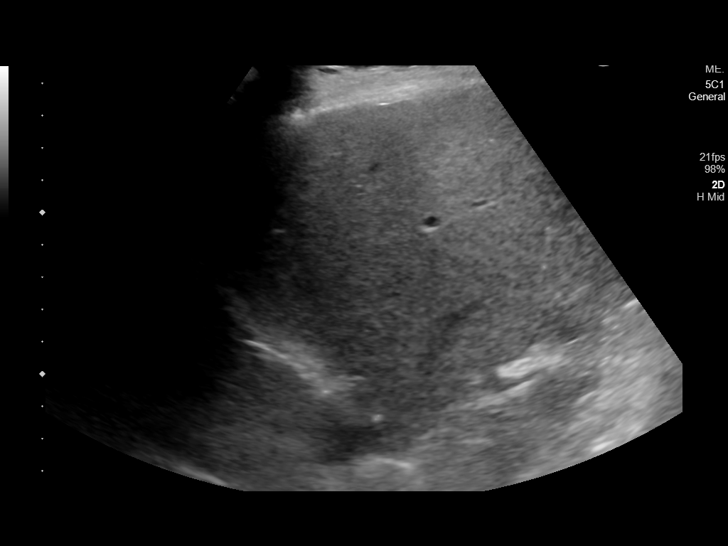
[im 24/27]
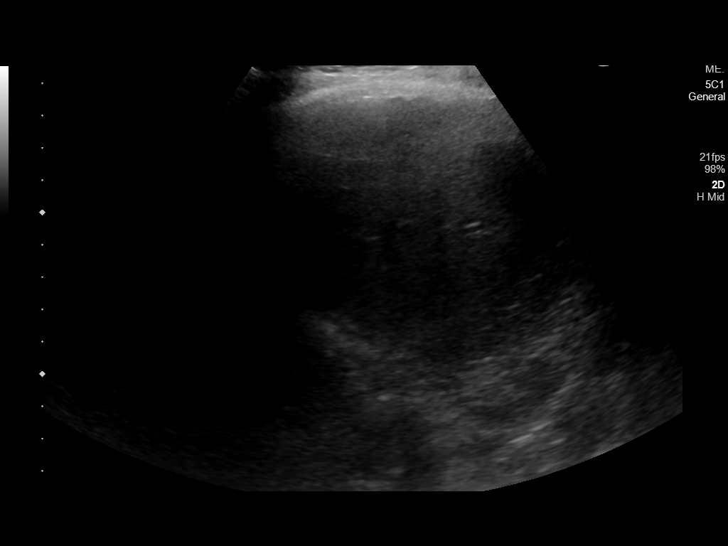
[im 27/27]
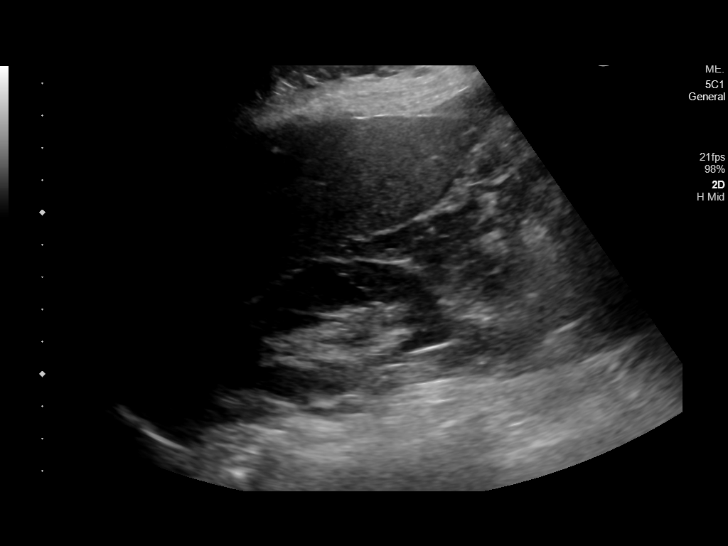

[14 of 25 positions shown; findings below may reference images not displayed]

FINDINGS: Gallbladder:

Status post cholecystectomy

Common bile duct:

Diameter: 8.2 mm

Liver:

Diffusely echogenic. No focal hepatic abnormality. Portal vein is
patent on color Doppler imaging with normal direction of blood flow
towards the liver.

Other: None.
IMPRESSION: 1. Status post cholecystectomy.
2. Echogenic liver consistent with hepatic steatosis and or
hepatocellular disease.

## 2022-06-10 IMAGING — CT CT ABD-PELV W/ CM
2 of 5 series · 16 of 46 positions shown, 18 images · IV contrast (APPLIED)
Comparison: CT the chest, abdomen and pelvis 06/12/2021.

CLINICAL DATA: 70-year-old female with history of abdominal pain.
Fever.

EXAM:
CT ABDOMEN AND PELVIS WITH CONTRAST
TECHNIQUE: Multidetector CT imaging of the abdomen and pelvis was performed
using the standard protocol following bolus administration of
intravenous contrast.
CONTRAST:  75mL OMNIPAQUE IOHEXOL 300 MG/ML  SOLN

[Series 2: routine abd/pel with · axial · 0.67mm/px · z∈[-670,-295]mm · 13 of 83 slices shown, 15 images]
[im 4/83  soft-tissue]
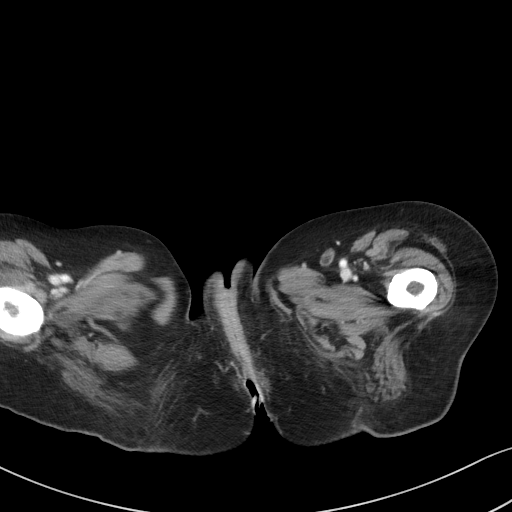
[im 4/83  bone]
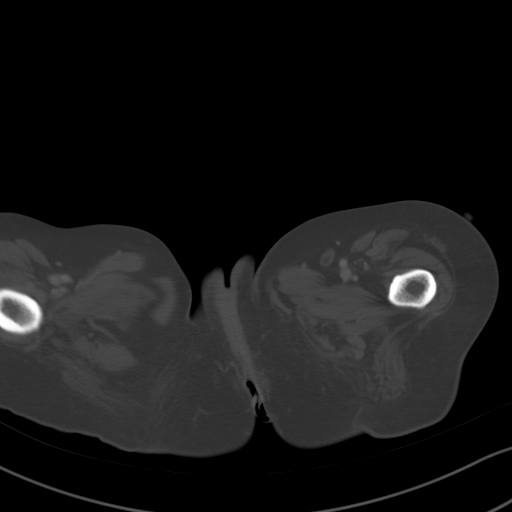
[im 12/83  soft-tissue]
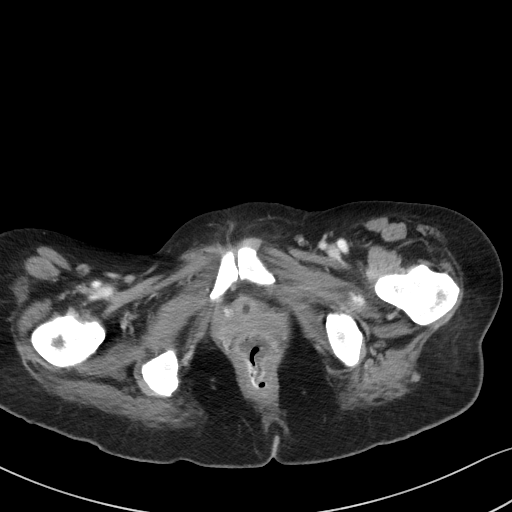
[im 16/83  soft-tissue]
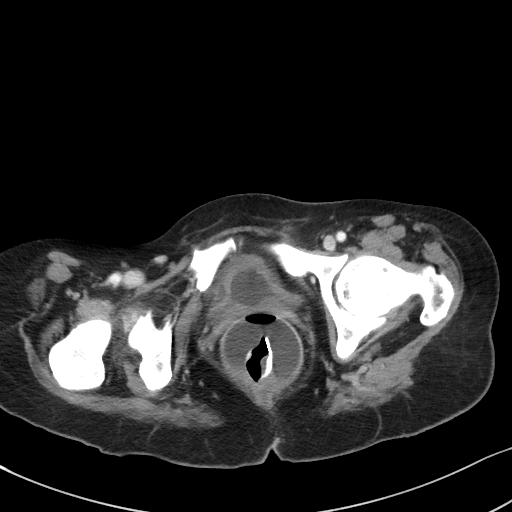
[im 24/83  soft-tissue]
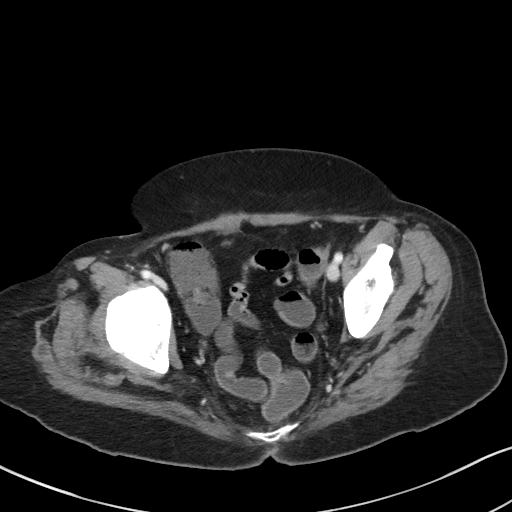
[im 28/83  soft-tissue]
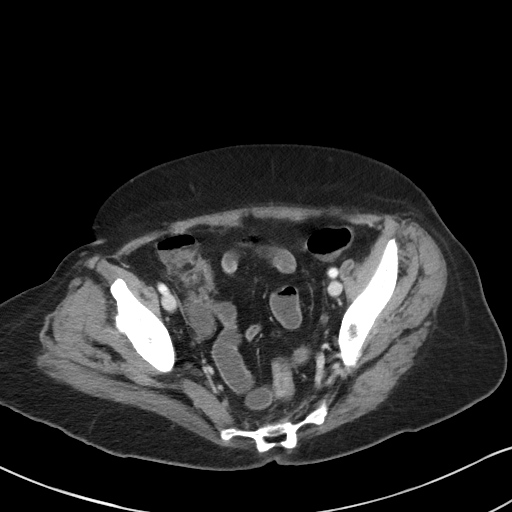
[im 36/83  soft-tissue]
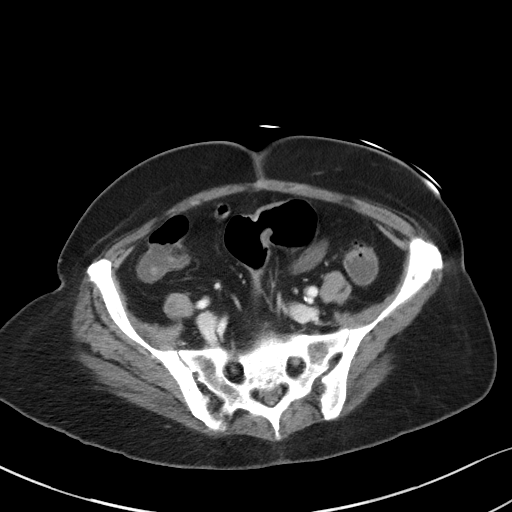
[im 43/83  soft-tissue]
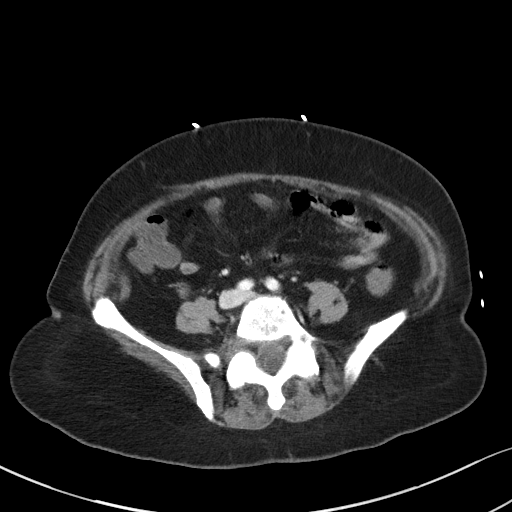
[im 47/83  soft-tissue]
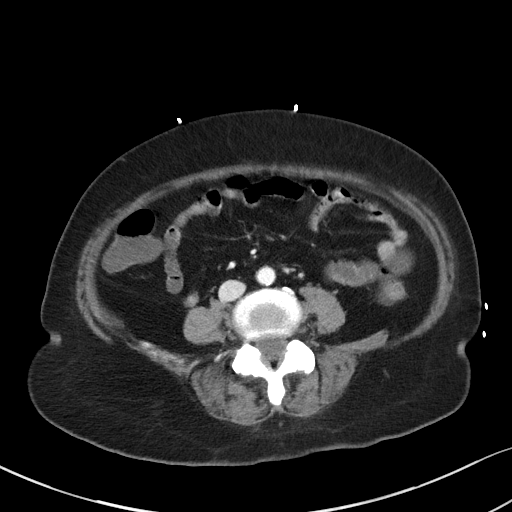
[im 55/83  soft-tissue]
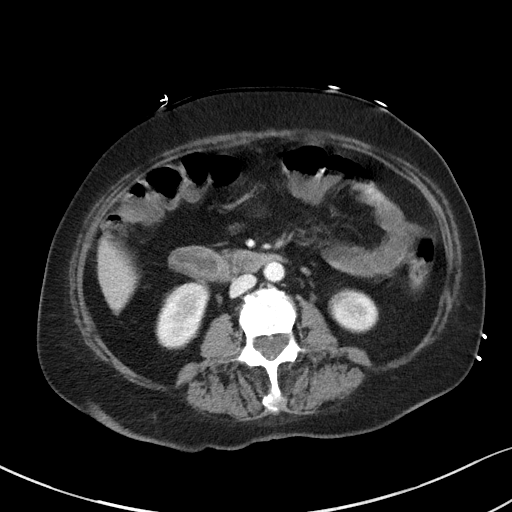
[im 55/83  bone]
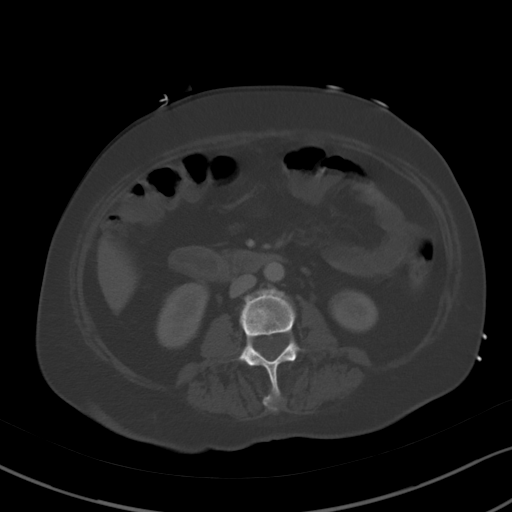
[im 59/83  soft-tissue]
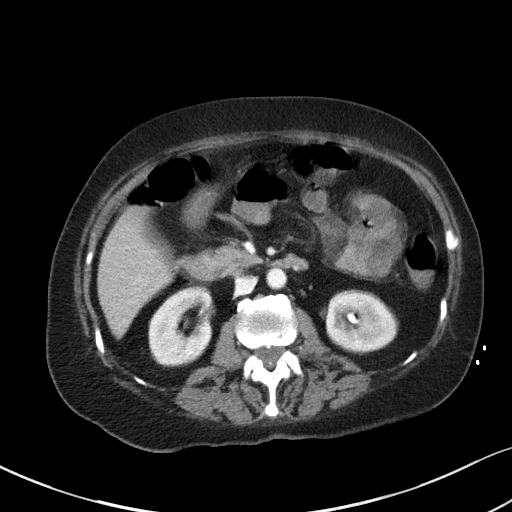
[im 67/83  soft-tissue]
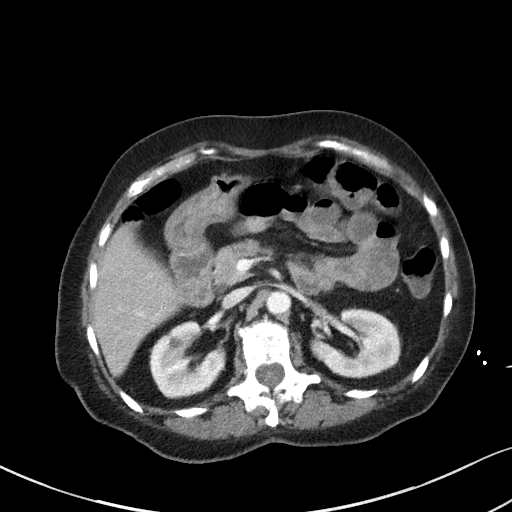
[im 71/83  soft-tissue]
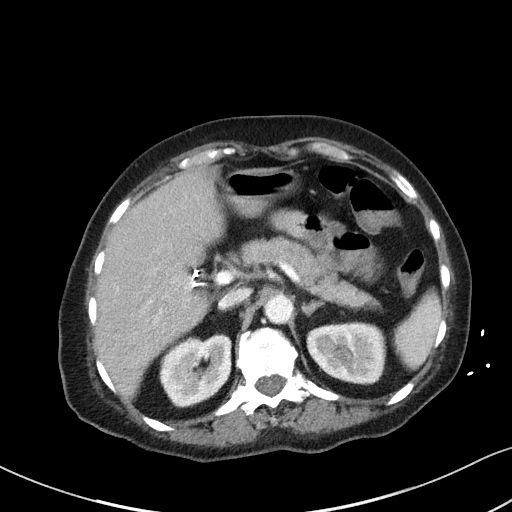
[im 79/83  soft-tissue]
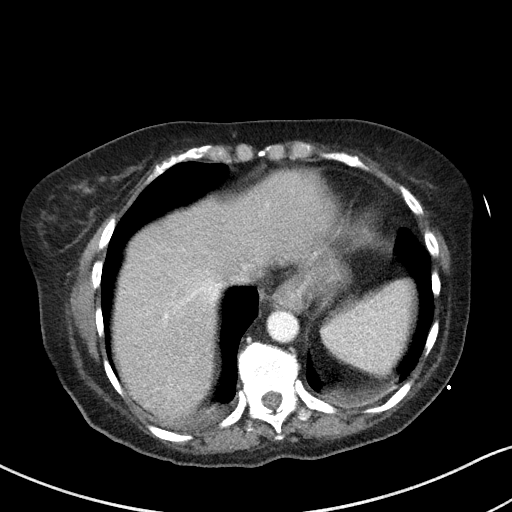

[Series 5: coronal st · coronal · 0.71mm/px · 3 of 80 slices shown]
[im 27/80  soft-tissue]
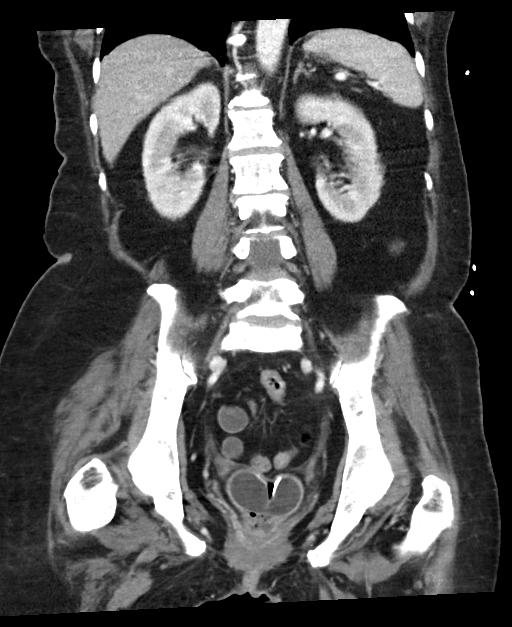
[im 36/80  soft-tissue]
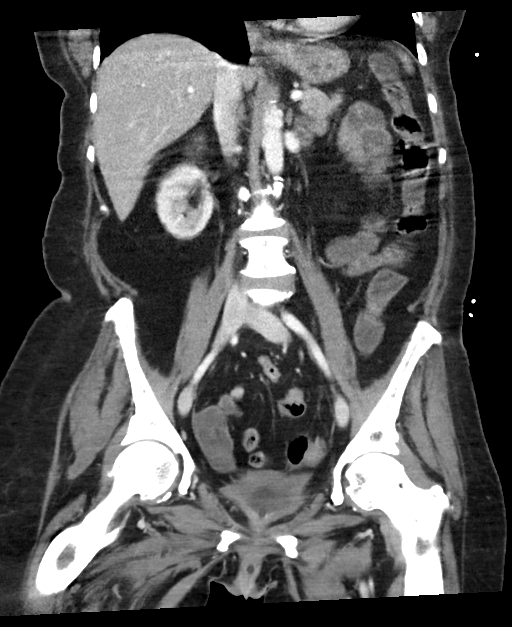
[im 44/80  soft-tissue]
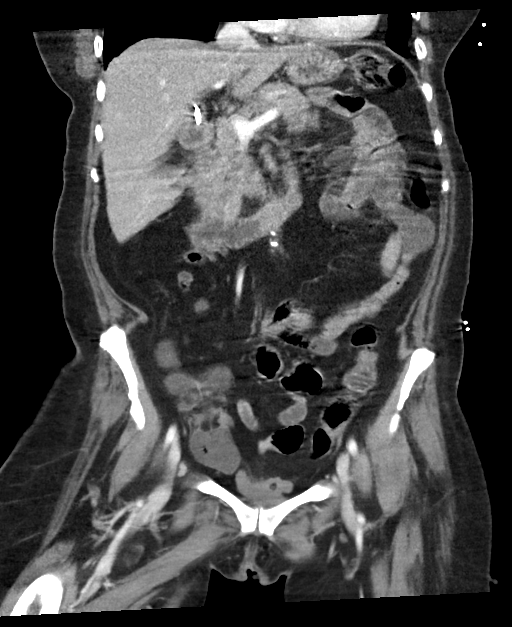

[16 of 46 positions shown; findings below may reference images not displayed]

FINDINGS: Lower chest: Trace bilateral pleural effusions lying dependently
with some minimal passive subsegmental atelectasis in the lower
lobes of the lungs bilaterally.

Hepatobiliary: No suspicious cystic or solid hepatic lesions are
confidently identified on today's noncontrast CT examination. Status
post cholecystectomy.

Pancreas: No pancreatic mass. No pancreatic ductal dilatation. No
pancreatic or peripancreatic fluid collections or inflammatory
changes.

Spleen: Unremarkable.

Adrenals/Urinary Tract: In the lower pole collecting system of the
left kidney there is a 7 mm nonobstructive calculus. In the upper
pole of the right kidney there is a subcentimeter low-attenuation
lesion, too small to characterize, but statistically likely to
represent a cyst. Bilateral adrenal glands are normal in appearance.
No hydroureteronephrosis. Urinary bladder is completely decompressed
around an indwelling Foley balloon catheter.

Stomach/Bowel: The appearance of the stomach is normal. No
pathologic dilatation of small bowel or colon. Rectal bag in
position. Normal appendix.

Vascular/Lymphatic: Aortic atherosclerosis, without evidence of
aneurysm or dissection in the abdominal or pelvic vasculature. No
lymphadenopathy noted in the abdomen or pelvis.

Reproductive: Status post hysterectomy. Ovaries are not confidently
identified may be surgically absent or atrophic.

Other: No significant volume of ascites.  No pneumoperitoneum.

Musculoskeletal: There are no aggressive appearing lytic or blastic
lesions noted in the visualized portions of the skeleton.
IMPRESSION: 1. No acute findings are noted in the abdomen or pelvis to account
for the patient's symptoms.
2. Trace bilateral pleural effusions.
3. 7 mm nonobstructive calculus in the lower pole collecting system
of left kidney. No ureteral stones or findings of urinary tract
obstruction are noted at this time.
4. Aortic atherosclerosis.
5. Additional incidental findings, as above.
